# Patient Record
Sex: Male | Born: 1935 | Race: White | Hispanic: No | Marital: Single | Smoking: Former smoker
Health system: Southern US, Community
[De-identification: ages and names within clinical notes are randomized; demographics above are authoritative.]

## PROBLEM LIST (undated history)

## (undated) DIAGNOSIS — J45909 Unspecified asthma, uncomplicated: Secondary | ICD-10-CM

## (undated) DIAGNOSIS — I739 Peripheral vascular disease, unspecified: Secondary | ICD-10-CM

## (undated) DIAGNOSIS — E785 Hyperlipidemia, unspecified: Secondary | ICD-10-CM

## (undated) DIAGNOSIS — I10 Essential (primary) hypertension: Secondary | ICD-10-CM

## (undated) DIAGNOSIS — M25569 Pain in unspecified knee: Secondary | ICD-10-CM

## (undated) DIAGNOSIS — M1990 Unspecified osteoarthritis, unspecified site: Secondary | ICD-10-CM

## (undated) DIAGNOSIS — R0989 Other specified symptoms and signs involving the circulatory and respiratory systems: Secondary | ICD-10-CM

## (undated) DIAGNOSIS — I2510 Atherosclerotic heart disease of native coronary artery without angina pectoris: Secondary | ICD-10-CM

## (undated) DIAGNOSIS — K648 Other hemorrhoids: Secondary | ICD-10-CM

## (undated) DIAGNOSIS — I714 Abdominal aortic aneurysm, without rupture, unspecified: Secondary | ICD-10-CM

## (undated) DIAGNOSIS — C801 Malignant (primary) neoplasm, unspecified: Secondary | ICD-10-CM

## (undated) DIAGNOSIS — K5730 Diverticulosis of large intestine without perforation or abscess without bleeding: Secondary | ICD-10-CM

## (undated) DIAGNOSIS — N529 Male erectile dysfunction, unspecified: Secondary | ICD-10-CM

## (undated) DIAGNOSIS — J449 Chronic obstructive pulmonary disease, unspecified: Secondary | ICD-10-CM

## (undated) DIAGNOSIS — I219 Acute myocardial infarction, unspecified: Secondary | ICD-10-CM

## (undated) DIAGNOSIS — Z8739 Personal history of other diseases of the musculoskeletal system and connective tissue: Secondary | ICD-10-CM

## (undated) DIAGNOSIS — Z8781 Personal history of (healed) traumatic fracture: Secondary | ICD-10-CM

## (undated) DIAGNOSIS — I6529 Occlusion and stenosis of unspecified carotid artery: Secondary | ICD-10-CM

## (undated) HISTORY — DX: Male erectile dysfunction, unspecified: N52.9

## (undated) HISTORY — DX: Occlusion and stenosis of unspecified carotid artery: I65.29

## (undated) HISTORY — DX: Peripheral vascular disease, unspecified: I73.9

## (undated) HISTORY — DX: Diverticulosis of large intestine without perforation or abscess without bleeding: K57.30

## (undated) HISTORY — DX: Abdominal aortic aneurysm, without rupture: I71.4

## (undated) HISTORY — DX: Other specified symptoms and signs involving the circulatory and respiratory systems: R09.89

## (undated) HISTORY — DX: Atherosclerotic heart disease of native coronary artery without angina pectoris: I25.10

## (undated) HISTORY — DX: Essential (primary) hypertension: I10

## (undated) HISTORY — DX: Acute myocardial infarction, unspecified: I21.9

## (undated) HISTORY — DX: Hyperlipidemia, unspecified: E78.5

## (undated) HISTORY — DX: Personal history of (healed) traumatic fracture: Z87.81

## (undated) HISTORY — DX: Chronic obstructive pulmonary disease, unspecified: J44.9

## (undated) HISTORY — DX: Pain in unspecified knee: M25.569

## (undated) HISTORY — DX: Personal history of other diseases of the musculoskeletal system and connective tissue: Z87.39

## (undated) HISTORY — DX: Unspecified osteoarthritis, unspecified site: M19.90

## (undated) HISTORY — DX: Abdominal aortic aneurysm, without rupture, unspecified: I71.40

## (undated) HISTORY — DX: Other hemorrhoids: K64.8

---

## 1999-08-22 ENCOUNTER — Encounter: Payer: Self-pay | Admitting: Physician Assistant

## 2003-01-23 DIAGNOSIS — I219 Acute myocardial infarction, unspecified: Secondary | ICD-10-CM

## 2003-01-23 HISTORY — PX: CORONARY ANGIOPLASTY: SHX604

## 2003-01-23 HISTORY — DX: Acute myocardial infarction, unspecified: I21.9

## 2003-08-31 ENCOUNTER — Encounter: Payer: Self-pay | Admitting: Cardiology

## 2003-08-31 ENCOUNTER — Inpatient Hospital Stay (HOSPITAL_COMMUNITY): Admission: EM | Admit: 2003-08-31 | Discharge: 2003-09-03 | Payer: Self-pay | Admitting: *Deleted

## 2003-09-01 ENCOUNTER — Encounter: Payer: Self-pay | Admitting: Cardiology

## 2003-12-07 ENCOUNTER — Ambulatory Visit (HOSPITAL_COMMUNITY): Admission: RE | Admit: 2003-12-07 | Discharge: 2003-12-07 | Payer: Self-pay | Admitting: Pulmonary Disease

## 2003-12-07 ENCOUNTER — Ambulatory Visit: Payer: Self-pay | Admitting: Pulmonary Disease

## 2004-03-03 ENCOUNTER — Ambulatory Visit: Payer: Self-pay | Admitting: Cardiology

## 2004-05-25 ENCOUNTER — Ambulatory Visit: Payer: Self-pay | Admitting: Cardiology

## 2004-07-24 ENCOUNTER — Ambulatory Visit: Payer: Self-pay | Admitting: Pulmonary Disease

## 2004-08-03 ENCOUNTER — Ambulatory Visit: Payer: Self-pay | Admitting: Pulmonary Disease

## 2004-08-22 ENCOUNTER — Ambulatory Visit: Payer: Self-pay | Admitting: Cardiology

## 2004-10-17 ENCOUNTER — Ambulatory Visit: Payer: Self-pay | Admitting: Pulmonary Disease

## 2004-11-22 ENCOUNTER — Ambulatory Visit: Payer: Self-pay | Admitting: *Deleted

## 2004-11-22 ENCOUNTER — Encounter: Payer: Self-pay | Admitting: Cardiology

## 2005-04-06 ENCOUNTER — Ambulatory Visit: Payer: Self-pay | Admitting: Cardiology

## 2005-11-07 ENCOUNTER — Ambulatory Visit: Payer: Self-pay | Admitting: Pulmonary Disease

## 2005-11-21 ENCOUNTER — Ambulatory Visit: Payer: Self-pay | Admitting: Pulmonary Disease

## 2006-01-23 ENCOUNTER — Ambulatory Visit: Payer: Self-pay | Admitting: Internal Medicine

## 2006-02-21 ENCOUNTER — Ambulatory Visit: Payer: Self-pay | Admitting: Internal Medicine

## 2006-04-25 ENCOUNTER — Ambulatory Visit: Payer: Self-pay | Admitting: Pulmonary Disease

## 2006-09-30 ENCOUNTER — Ambulatory Visit: Payer: Self-pay | Admitting: Surgery

## 2006-10-29 ENCOUNTER — Ambulatory Visit: Payer: Self-pay | Admitting: Pulmonary Disease

## 2006-11-04 ENCOUNTER — Ambulatory Visit: Payer: Self-pay | Admitting: Pulmonary Disease

## 2006-11-04 LAB — CONVERTED CEMR LAB
ALT: 25 units/L (ref 0–53)
AST: 27 units/L (ref 0–37)
Albumin: 3.6 g/dL (ref 3.5–5.2)
Alkaline Phosphatase: 85 units/L (ref 39–117)
BUN: 8 mg/dL (ref 6–23)
Basophils Absolute: 0 10*3/uL (ref 0.0–0.1)
Basophils Relative: 0.7 % (ref 0.0–1.0)
Bilirubin, Direct: 0.3 mg/dL (ref 0.0–0.3)
CO2: 33 meq/L — ABNORMAL HIGH (ref 19–32)
Calcium: 9.3 mg/dL (ref 8.4–10.5)
Chloride: 106 meq/L (ref 96–112)
Cholesterol: 109 mg/dL (ref 0–200)
Creatinine, Ser: 0.9 mg/dL (ref 0.4–1.5)
Eosinophils Absolute: 0.5 10*3/uL (ref 0.0–0.6)
Eosinophils Relative: 7.9 % — ABNORMAL HIGH (ref 0.0–5.0)
GFR calc Af Amer: 108 mL/min
GFR calc non Af Amer: 89 mL/min
Glucose, Bld: 110 mg/dL — ABNORMAL HIGH (ref 70–99)
HCT: 41.1 % (ref 39.0–52.0)
HDL: 36.4 mg/dL — ABNORMAL LOW (ref >39.0)
Hemoglobin: 14.2 g/dL (ref 13.0–17.0)
LDL Cholesterol: 59 mg/dL (ref 0–99)
Lymphocytes Relative: 21.4 % (ref 12.0–46.0)
MCHC: 34.5 g/dL (ref 30.0–36.0)
MCV: 91.8 fL (ref 78.0–100.0)
Monocytes Absolute: 0.6 10*3/uL (ref 0.2–0.7)
Monocytes Relative: 9.7 % (ref 3.0–11.0)
Neutro Abs: 3.7 10*3/uL (ref 1.4–7.7)
Neutrophils Relative %: 60.3 % (ref 43.0–77.0)
PSA: 0.69 ng/mL (ref 0.10–4.00)
Platelets: 165 10*3/uL (ref 150–400)
Potassium: 4.7 meq/L (ref 3.5–5.1)
RBC: 4.47 M/uL (ref 4.22–5.81)
RDW: 12.4 % (ref 11.5–14.6)
Sodium: 145 meq/L (ref 135–145)
TSH: 1.59 microintl units/mL (ref 0.35–5.50)
Total Bilirubin: 1.6 mg/dL — ABNORMAL HIGH (ref 0.3–1.2)
Total CHOL/HDL Ratio: 3
Total Protein: 7 g/dL (ref 6.0–8.3)
Triglycerides: 70 mg/dL (ref 0–149)
VLDL: 14 mg/dL (ref 0–40)
WBC: 6.1 10*3/uL (ref 4.5–10.5)

## 2006-11-05 DIAGNOSIS — E785 Hyperlipidemia, unspecified: Secondary | ICD-10-CM | POA: Insufficient documentation

## 2006-11-05 DIAGNOSIS — K648 Other hemorrhoids: Secondary | ICD-10-CM | POA: Insufficient documentation

## 2006-11-05 DIAGNOSIS — I10 Essential (primary) hypertension: Secondary | ICD-10-CM | POA: Insufficient documentation

## 2006-11-05 DIAGNOSIS — I739 Peripheral vascular disease, unspecified: Secondary | ICD-10-CM | POA: Insufficient documentation

## 2006-11-05 DIAGNOSIS — I2510 Atherosclerotic heart disease of native coronary artery without angina pectoris: Secondary | ICD-10-CM | POA: Insufficient documentation

## 2007-04-29 ENCOUNTER — Telehealth (INDEPENDENT_AMBULATORY_CARE_PROVIDER_SITE_OTHER): Payer: Self-pay | Admitting: *Deleted

## 2007-04-29 ENCOUNTER — Ambulatory Visit: Payer: Self-pay | Admitting: Pulmonary Disease

## 2007-04-29 DIAGNOSIS — M109 Gout, unspecified: Secondary | ICD-10-CM

## 2007-04-29 DIAGNOSIS — K5730 Diverticulosis of large intestine without perforation or abscess without bleeding: Secondary | ICD-10-CM | POA: Insufficient documentation

## 2007-04-29 DIAGNOSIS — T148XXA Other injury of unspecified body region, initial encounter: Secondary | ICD-10-CM | POA: Insufficient documentation

## 2007-05-02 DIAGNOSIS — J4489 Other specified chronic obstructive pulmonary disease: Secondary | ICD-10-CM | POA: Insufficient documentation

## 2007-05-02 DIAGNOSIS — F528 Other sexual dysfunction not due to a substance or known physiological condition: Secondary | ICD-10-CM

## 2007-05-02 DIAGNOSIS — J449 Chronic obstructive pulmonary disease, unspecified: Secondary | ICD-10-CM

## 2007-05-02 LAB — CONVERTED CEMR LAB
ALT: 24 units/L (ref 0–53)
AST: 30 units/L (ref 0–37)
Albumin: 4.1 g/dL (ref 3.5–5.2)
Alkaline Phosphatase: 81 units/L (ref 39–117)
BUN: 13 mg/dL (ref 6–23)
Bilirubin, Direct: 0.3 mg/dL (ref 0.0–0.3)
CO2: 32 meq/L (ref 19–32)
Calcium: 9.3 mg/dL (ref 8.4–10.5)
Chloride: 104 meq/L (ref 96–112)
Cholesterol: 141 mg/dL (ref 0–200)
Creatinine, Ser: 0.9 mg/dL (ref 0.4–1.5)
GFR calc Af Amer: 107 mL/min
GFR calc non Af Amer: 89 mL/min
Glucose, Bld: 91 mg/dL (ref 70–99)
HDL: 38 mg/dL — ABNORMAL LOW (ref >39.0)
LDL Cholesterol: 87 mg/dL (ref 0–99)
Potassium: 4.7 meq/L (ref 3.5–5.1)
Sodium: 140 meq/L (ref 135–145)
Total Bilirubin: 1.6 mg/dL — ABNORMAL HIGH (ref 0.3–1.2)
Total CHOL/HDL Ratio: 3.7
Total Protein: 7.3 g/dL (ref 6.0–8.3)
Triglycerides: 81 mg/dL (ref 0–149)
VLDL: 16 mg/dL (ref 0–40)

## 2007-10-06 ENCOUNTER — Ambulatory Visit: Payer: Self-pay | Admitting: Surgery

## 2007-10-28 ENCOUNTER — Ambulatory Visit: Payer: Self-pay | Admitting: Pulmonary Disease

## 2007-11-02 DIAGNOSIS — R0989 Other specified symptoms and signs involving the circulatory and respiratory systems: Secondary | ICD-10-CM | POA: Insufficient documentation

## 2007-11-02 LAB — CONVERTED CEMR LAB
ALT: 29 units/L (ref 0–53)
AST: 32 units/L (ref 0–37)
Albumin: 4.1 g/dL (ref 3.5–5.2)
Alkaline Phosphatase: 74 units/L (ref 39–117)
BUN: 13 mg/dL (ref 6–23)
Basophils Absolute: 0 10*3/uL (ref 0.0–0.1)
Basophils Relative: 0 % (ref 0.0–3.0)
Bilirubin, Direct: 0.2 mg/dL (ref 0.0–0.3)
CO2: 30 meq/L (ref 19–32)
Calcium: 9.4 mg/dL (ref 8.4–10.5)
Chloride: 104 meq/L (ref 96–112)
Cholesterol: 150 mg/dL (ref 0–200)
Creatinine, Ser: 1 mg/dL (ref 0.4–1.5)
Eosinophils Absolute: 0.5 10*3/uL (ref 0.0–0.7)
Eosinophils Relative: 4.9 % (ref 0.0–5.0)
GFR calc Af Amer: 95 mL/min
GFR calc non Af Amer: 79 mL/min
Glucose, Bld: 112 mg/dL — ABNORMAL HIGH (ref 70–99)
HCT: 44.7 % (ref 39.0–52.0)
HDL: 45.2 mg/dL (ref >39.0)
Hemoglobin: 15.6 g/dL (ref 13.0–17.0)
LDL Cholesterol: 81 mg/dL (ref 0–99)
Lymphocytes Relative: 15.6 % (ref 12.0–46.0)
MCHC: 34.9 g/dL (ref 30.0–36.0)
MCV: 91.4 fL (ref 78.0–100.0)
Monocytes Absolute: 0.9 10*3/uL (ref 0.1–1.0)
Monocytes Relative: 8.3 % (ref 3.0–12.0)
Neutro Abs: 7.4 10*3/uL (ref 1.4–7.7)
Neutrophils Relative %: 71.2 % (ref 43.0–77.0)
PSA: 0.67 ng/mL (ref 0.10–4.00)
Platelets: 192 10*3/uL (ref 150–400)
Potassium: 4.5 meq/L (ref 3.5–5.1)
RBC: 4.89 M/uL (ref 4.22–5.81)
RDW: 12.1 % (ref 11.5–14.6)
Sodium: 141 meq/L (ref 135–145)
TSH: 1.16 microintl units/mL (ref 0.35–5.50)
Total Bilirubin: 1.2 mg/dL (ref 0.3–1.2)
Total CHOL/HDL Ratio: 3.3
Total Protein: 7.6 g/dL (ref 6.0–8.3)
Triglycerides: 120 mg/dL (ref 0–149)
VLDL: 24 mg/dL (ref 0–40)
WBC: 10.4 10*3/uL (ref 4.5–10.5)

## 2008-02-03 ENCOUNTER — Ambulatory Visit: Payer: Self-pay

## 2008-04-01 ENCOUNTER — Ambulatory Visit: Payer: Self-pay | Admitting: *Deleted

## 2008-04-08 ENCOUNTER — Telehealth (INDEPENDENT_AMBULATORY_CARE_PROVIDER_SITE_OTHER): Payer: Self-pay | Admitting: *Deleted

## 2008-04-26 ENCOUNTER — Ambulatory Visit: Payer: Self-pay | Admitting: Pulmonary Disease

## 2008-04-26 ENCOUNTER — Telehealth (INDEPENDENT_AMBULATORY_CARE_PROVIDER_SITE_OTHER): Payer: Self-pay | Admitting: *Deleted

## 2008-05-02 LAB — CONVERTED CEMR LAB
ALT: 28 units/L (ref 0–53)
AST: 28 units/L (ref 0–37)
Albumin: 3.9 g/dL (ref 3.5–5.2)
Alkaline Phosphatase: 73 units/L (ref 39–117)
BUN: 12 mg/dL (ref 6–23)
Basophils Absolute: 0 10*3/uL (ref 0.0–0.1)
Basophils Relative: 0.5 % (ref 0.0–3.0)
Bilirubin, Direct: 0.2 mg/dL (ref 0.0–0.3)
CO2: 31 meq/L (ref 19–32)
Calcium: 9.2 mg/dL (ref 8.4–10.5)
Chloride: 107 meq/L (ref 96–112)
Cholesterol: 145 mg/dL (ref 0–200)
Creatinine, Ser: 0.9 mg/dL (ref 0.4–1.5)
Eosinophils Absolute: 0.5 10*3/uL (ref 0.0–0.7)
Eosinophils Relative: 5.4 % — ABNORMAL HIGH (ref 0.0–5.0)
GFR calc non Af Amer: 88.39 mL/min (ref >60)
Glucose, Bld: 96 mg/dL (ref 70–99)
HCT: 44.9 % (ref 39.0–52.0)
HDL: 41.3 mg/dL (ref >39.00)
Hemoglobin: 15.3 g/dL (ref 13.0–17.0)
LDL Cholesterol: 86 mg/dL (ref 0–99)
Lymphocytes Relative: 18.9 % (ref 12.0–46.0)
Lymphs Abs: 1.6 10*3/uL (ref 0.7–4.0)
MCHC: 34.1 g/dL (ref 30.0–36.0)
MCV: 93 fL (ref 78.0–100.0)
Monocytes Absolute: 0.6 10*3/uL (ref 0.1–1.0)
Monocytes Relative: 7 % (ref 3.0–12.0)
Neutro Abs: 5.7 10*3/uL (ref 1.4–7.7)
Neutrophils Relative %: 68.2 % (ref 43.0–77.0)
PSA: 0.76 ng/mL (ref 0.10–4.00)
Platelets: 153 10*3/uL (ref 150.0–400.0)
Potassium: 4.5 meq/L (ref 3.5–5.1)
RBC: 4.84 M/uL (ref 4.22–5.81)
RDW: 12.1 % (ref 11.5–14.6)
Sodium: 143 meq/L (ref 135–145)
TSH: 1.3 microintl units/mL (ref 0.35–5.50)
Total Bilirubin: 1 mg/dL (ref 0.3–1.2)
Total CHOL/HDL Ratio: 4
Total Protein: 7.5 g/dL (ref 6.0–8.3)
Triglycerides: 90 mg/dL (ref 0.0–149.0)
Uric Acid, Serum: 8.4 mg/dL — ABNORMAL HIGH (ref 4.0–7.8)
VLDL: 18 mg/dL (ref 0.0–40.0)
WBC: 8.4 10*3/uL (ref 4.5–10.5)

## 2008-05-31 LAB — CONVERTED CEMR LAB: Vit D, 25-Hydroxy: 26 ng/mL — ABNORMAL LOW (ref 30–89)

## 2008-11-18 ENCOUNTER — Ambulatory Visit: Payer: Self-pay | Admitting: Vascular Surgery

## 2009-01-27 ENCOUNTER — Ambulatory Visit: Payer: Self-pay | Admitting: Pulmonary Disease

## 2009-01-30 LAB — CONVERTED CEMR LAB
ALT: 32 units/L (ref 0–53)
AST: 29 units/L (ref 0–37)
Albumin: 3.9 g/dL (ref 3.5–5.2)
Alkaline Phosphatase: 78 units/L (ref 39–117)
BUN: 16 mg/dL (ref 6–23)
Basophils Absolute: 0.2 10*3/uL — ABNORMAL HIGH (ref 0.0–0.1)
Basophils Relative: 1.9 % (ref 0.0–3.0)
Bilirubin, Direct: 0.2 mg/dL (ref 0.0–0.3)
CO2: 30 meq/L (ref 19–32)
Calcium: 9.3 mg/dL (ref 8.4–10.5)
Chloride: 105 meq/L (ref 96–112)
Cholesterol: 137 mg/dL (ref 0–200)
Creatinine, Ser: 0.9 mg/dL (ref 0.4–1.5)
Eosinophils Absolute: 0.4 10*3/uL (ref 0.0–0.7)
Eosinophils Relative: 4.1 % (ref 0.0–5.0)
GFR calc non Af Amer: 88.2 mL/min (ref >60)
Glucose, Bld: 83 mg/dL (ref 70–99)
HCT: 45.3 % (ref 39.0–52.0)
HDL: 33.3 mg/dL — ABNORMAL LOW (ref >39.00)
Hemoglobin: 14.8 g/dL (ref 13.0–17.0)
LDL Cholesterol: 86 mg/dL (ref 0–99)
Lymphocytes Relative: 15 % (ref 12.0–46.0)
Lymphs Abs: 1.3 10*3/uL (ref 0.7–4.0)
MCHC: 32.7 g/dL (ref 30.0–36.0)
MCV: 98.1 fL (ref 78.0–100.0)
Monocytes Absolute: 0.5 10*3/uL (ref 0.1–1.0)
Monocytes Relative: 6.2 % (ref 3.0–12.0)
Neutro Abs: 6.3 10*3/uL (ref 1.4–7.7)
Neutrophils Relative %: 72.8 % (ref 43.0–77.0)
Platelets: 190 10*3/uL (ref 150.0–400.0)
Potassium: 4.3 meq/L (ref 3.5–5.1)
RBC: 4.62 M/uL (ref 4.22–5.81)
RDW: 12.8 % (ref 11.5–14.6)
Sodium: 143 meq/L (ref 135–145)
Total Bilirubin: 1.2 mg/dL (ref 0.3–1.2)
Total CHOL/HDL Ratio: 4
Total Protein: 7.6 g/dL (ref 6.0–8.3)
Triglycerides: 87 mg/dL (ref 0.0–149.0)
Uric Acid, Serum: 5.3 mg/dL (ref 4.0–7.8)
VLDL: 17.4 mg/dL (ref 0.0–40.0)
WBC: 8.7 10*3/uL (ref 4.5–10.5)

## 2009-05-25 ENCOUNTER — Encounter: Payer: Self-pay | Admitting: Pulmonary Disease

## 2009-05-25 ENCOUNTER — Ambulatory Visit: Payer: Self-pay | Admitting: Vascular Surgery

## 2009-05-30 ENCOUNTER — Telehealth: Payer: Self-pay | Admitting: Adult Health

## 2009-05-31 ENCOUNTER — Ambulatory Visit: Payer: Self-pay | Admitting: Adult Health

## 2009-05-31 DIAGNOSIS — M25569 Pain in unspecified knee: Secondary | ICD-10-CM

## 2009-06-08 ENCOUNTER — Encounter: Payer: Self-pay | Admitting: Pulmonary Disease

## 2009-07-11 ENCOUNTER — Telehealth (INDEPENDENT_AMBULATORY_CARE_PROVIDER_SITE_OTHER): Payer: Self-pay | Admitting: *Deleted

## 2009-08-31 ENCOUNTER — Ambulatory Visit: Payer: Self-pay | Admitting: Cardiology

## 2009-09-01 ENCOUNTER — Encounter: Payer: Self-pay | Admitting: Cardiology

## 2009-09-07 ENCOUNTER — Encounter: Payer: Self-pay | Admitting: Physician Assistant

## 2009-09-07 ENCOUNTER — Ambulatory Visit: Payer: Self-pay | Admitting: Vascular Surgery

## 2009-11-30 ENCOUNTER — Ambulatory Visit: Payer: Self-pay | Admitting: Vascular Surgery

## 2009-12-28 ENCOUNTER — Ambulatory Visit: Payer: Self-pay | Admitting: Pulmonary Disease

## 2010-01-27 ENCOUNTER — Telehealth: Payer: Self-pay | Admitting: Pulmonary Disease

## 2010-02-22 NOTE — Progress Notes (Signed)
Summary: Office Visit/ [employer]  Office Visit/ [facility]   Imported By: Dorise Hiss 08/30/2009 16:54:21  _____________________________________________________________________  External Attachment:    Type:   Image     Comment:   External Document

## 2010-02-22 NOTE — Assessment & Plan Note (Signed)
Summary: Acute NP office visit - knee pain   CC:  bilat knee pain 1.5weeks; states it feels like it's in the ligaments or tendons.  swelling and occ burning sensation in left knee which patient states is worse than the right.  states took indomethacine and colchicine but didn't help pain.[name]  History of Present Illness: 75  y/o WM here for a follow up visit... he has mult med issues as noted below...    ~  Apr10:  he is followed for cardiology by [facility] in their [facility]... today he notes a cough w/ brownish sputum x 3-4 weeks... no f/c/s, no incr SOB, no CP, etc... last CXR 10/07 showed underlying COPD, NAD;  last CBC 10/09 was OK... we discussed Rx w/ Avelox, Mucinex, Fluids... NOTE:  CDoppler's from 1/10 showed monophasic left brachial waveform and retrograde vertebral flow c/w steal...   ~  January 27, 2009:  presents w/ 2wk hx "gout" in left knee... painful, tender, not really swollen or red etc... went to an [facility] w/ XRay showing "mild arthritis" & given Indocin & Colchicine- but only min better... he's been on Allopurinol 300mg /d since last OV w/ uric= 8.4 then & no bouts til now... we discussed Rx w/ Deop/ Pred now & refer to Ortho for tap etc if this occurs in the future, check labs.  May 31, 2009--Presents for a work in visit. Complains of bilat knee pain 1.5weeks; states it feels like it's in the ligaments or tendons.  swelling, occ burning sensation in left knee which patient states is worse than the right.  states took indomethacine and colchicine but didn't help pain. Had similar episode earlier this year in January, Uric acid level was normal. He was tx w/ steroid burst which helped. Has had minimal swelling. Knees ache at times and are painful to walk on for long distance and w/ bending. Sore to touch below knee caps esp on left. no rash, calf pain, swelling or chest pain.          Medications Prior to Update: 1)  Bufferin 325 Mg Tabs (Aspirin Buf(Cacarb-Mgcarb-Mgo)) .... Take 1  Tab By Mouth Once Daily.Marland KitchenMarland Kitchen 2)  Plavix 75 Mg  Tabs (Clopidogrel Bisulfate) .... Take 1 Tab By Mouth Once Daily 3)  Toprol Xl 50 Mg  Tb24 (Metoprolol Succinate) .... Take 1 Tab By Mouth Once Daily 4)  Nitrostat 0.4 Mg  Subl (Nitroglycerin) .... Use As Directed For Chest Pain... 5)  Simvastatin 80 Mg  Tabs (Simvastatin) .... Take 1 Tab By Mouth At Bedtime 6)  Niacin Cr 500 Mg  Cpcr (Niacin) .... Take 1 Tab By Mouth At Bedtime.Marland KitchenMarland Kitchen 7)  Viagra 100 Mg  Tabs (Sildenafil Citrate) .... Use As Directed... 8)  Indomethacin 25 Mg Caps (Indomethacin) .... Take 1 Cap By Mouth Three Times A Day As Needed For Gout Pain... 9)  Colchicine 0.6 Mg  Tabs (Colchicine) .... Take 1 Tab By Mouth Two Times A Day As Needed For Gout... 10)  Allopurinol 300 Mg Tabs (Allopurinol) .... Take 1 Tablet By Mouth Once A Day For Gout Prevention 11)  Vitamin D3 1000 Unit Caps (Cholecalciferol) .... Take 1 Tablet By Mouth Once A Day 12)  Prednisone 20 Mg Tabs (Prednisone) .... Take 1 Tab By Mouth Two Times A Day X3d, Then 1 Tab Daily X3d, Then 1/2 Tab Daily Til Gone...  Current Medications (verified): 1)  Bufferin 325 Mg Tabs (Aspirin Buf(Cacarb-Mgcarb-Mgo)) .... Take 1 Tab By Mouth Once Daily.Marland KitchenMarland Kitchen 2)  Plavix 75 Mg  Tabs (Clopidogrel Bisulfate) .... Take 1 Tab By Mouth Once Daily 3)  Toprol Xl 50 Mg  Tb24 (Metoprolol Succinate) .... Take 1/2 Tab By Mouth Once Daily 4)  Nitrostat 0.4 Mg  Subl (Nitroglycerin) .... Use As Directed For Chest Pain... 5)  Simvastatin 80 Mg  Tabs (Simvastatin) .... Take 1 Tab By Mouth At Bedtime 6)  Niacin Cr 500 Mg  Cpcr (Niacin) .... Take 1 Tab By Mouth At Bedtime.Marland KitchenMarland Kitchen 7)  Viagra 100 Mg  Tabs (Sildenafil Citrate) .... Use As Directed... 8)  Indomethacin 25 Mg Caps (Indomethacin) .... Take 1 Cap By Mouth Three Times A Day As Needed For Gout Pain... 9)  Colchicine 0.6 Mg  Tabs (Colchicine) .... Take 1 Tab By Mouth Two Times A Day As Needed For Gout... 10)  Allopurinol 300 Mg Tabs (Allopurinol) .... Take 1  Tablet By Mouth Once A Day For Gout Prevention 11)  Vitamin D3 1000 Unit Caps (Cholecalciferol) .... Take 1 Tablet By Mouth Once A Day  Allergies (verified): 1)  ! Pcn 2)  ! Sulfa 3)  ! Doxycycline  Past History:  Family History: Last updated: February 18, 2009 Father died age 73 from an MI Mother alive age 12 w/ hx heart dis, DM, colon cancer... 5Sibs:  4Bro- 1 died from lung cancer, 1 has DM, 1has heart dis & CABG...  & 1Sis- in good health.  Social History: Last updated: 02/18/2009 Married  smoked 1 1/2 PPD for 20+ years--quit smoking in about 1984 social alcohol works part time at pharmacy  Risk Factors: Smoking Status: quit (10/28/2007)  Past Medical History: COPD(ICD-496)  -  ex-smoker, quit 20+yrs ago... he uses Mucinex as needed...    ~  CTChest 11/05 w/ emphysema changes...  ~  CXR 10/07 showed mod emphysematous changes, clear lungs, NAD.[name].  ~  Apr10:  acute bronchitic symptoms- CXR= COPD, scarring right base, NAD; Rx w/ Avelox/ Mucinex.  HYPERTENSION (ICD-401.9) - on TOPROL XL 50mg - 1/2 tab daily (he decr on his own)...   CORONARY ARTERY DISEASE (ICD-414.00) - on ASA 325mg /d & PLAVIX 75mg /d...     Hx of CAROTID BRUIT (ICD-785.9) - known left CBruit w/ CDoppler in 2000 showing min plaque, and no signif flow reduction...  ~  CDoppler 1/10 showed 0-39% bilat ICA stenoses + monophasic left brachial waveform and retrograde left vertebral flow c/w steal...  PERIPHERAL VASCULAR DISEASE (ICD-443.9) - on above meds... 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 followed by [facility] yearly: no pain, etc...  ~  AbdAo Doppler 8/08 showed 3.6-3.7cm size... no change.  ~  AbdAo Doppler 9/09 showed 3.9cm AAA and they plan f/u in 55mo...  ~  AbdAo Doppler 3/10 showed 4.0 x 4.2 cm AA measurement...  ~  AbdAo Doppler 10/10 showed 4.4 x 3.8 cm AA measurement...  HYPERLIPIDEMIA (ICD-272.4) - on SIMVASTATIN 80mg /d & OTC NIACIN Bid...   ~  FLP 10/08  showed TChol 109, TG 70, HDL 36, LDL 59  ~  FLP 4/09 showed TChol 141, TG 81, HDL 38, LDL 87... continue same.  ~  FLP 10/09 showed TChol 150, TG 120, HDL 45, LDL 81  ~  FLP 4/10 showed TChol 145, TG 90, HDL 41, LDL 86  ~  FLP 1/11 showed TChol 137, TG 87, HDL 33, LDL 86  DIVERTICULOSIS OF COLON (ICD-562.10) - last colonoscopy 1/08 by DrBrodie showed divertics, hems... f/u planned 12yrs... HEMORRHOIDS, INTERNAL (ICD-455.0)  ERECTILE DYSFUNCTION (ICD-302.72) - uses Viagra as needed.  Hx of GOUT (ICD-274.9) -  on ALLOPURINOL 300mg /d since 4/10... he uses Indocin and Colchicine as needed...  ~  1/11:  bout of left knee pain/ tender, min swelling, not red- min better on Indocin/ Colchicine- therefore Rx w/ Depo/ Pred... Uric=5.3     Review of Systems      See HPI  Vital Signs:  Patient profile:   75 year old male Height:      70 inches Weight:      168 pounds BMI:     24.19 O2 Sat:      98 % on Room air Temp:     98.1 degrees F oral Pulse rate:   71 / minute BP sitting:   114 / 84  (left arm) Cuff size:   regular  Vitals Entered By: Boone Master CNA (May 31, 2009 10:12 AM)  O2 Flow:  Room air CC: bilat knee pain 1.5weeks; states it feels like it's in the ligaments or tendons.  swelling, occ burning sensation in left knee which patient states is worse than the right.  states took indomethacine and colchicine but didn't help pain. Is Patient Diabetic? No Comments Medications reviewed with patient Daytime contact number verified with patient. Boone Master CNA  May 31, 2009 10:10 AM    Physical Exam  Additional Exam:  WD, WN, 75 y/o WM in NAD... GENERAL:  Alert & oriented; pleasant & cooperative... HEENT:  [place]/AT, EOM-wnl, PERRLA, EACs-clear, TMs-wnl, NOSE-clear, THROAT- clear... NECK:  Supple w/ fairROM; no JVD; normal carotid impulses w/o bruits; no thyromegaly or nodules palpated; no lymphadenopathy. CHEST:  Clear to P & A; decr BS bilat, without wheezes/ rales/ or  rhonchi. HEART:  Regular Rhythm; gr 1/6 SEM without rubs or gallops... ABDOMEN:  Soft & nontender; normal bowel sounds; no organomegaly or masses detected. EXT: without deformities, mild arthritic changes & tender left knee; no varicose veins/ venous insuffic/ or edema. NEURO:  CN's intact; motor testing normal; sensory testing normal; gait normal nml but states knee hurts esp on left.  Muscul: tender along bilateral knees under patella. no rash, redness, neg homans sign. no obvious swelling in knee, ROM decreased and painful esp on left.  DERM:  No lesions noted; no rash etc...     Impression & Recommendations:  Problem # 1:  KNEE PAIN (ICD-719.46)  Bilateral knee pian suspect DJD  will refer to ortho to evaluate, unresponsive to NSAIDS  uric acid level nml in past w/ knee flare. doubt gout REC:  Prednisone taper over next week.  Warm heat to knees.  Tramadol for severe pain, 1 by mouth every 6 hr as needed We are referring you to ortho to evaluate your knee pain. Please contact office for sooner follow up if symptoms do not improve or worsen   Orders: Est. Patient Level IV (16109)  Medications Added to Medication List This Visit: 1)  Toprol Xl 50 Mg Tb24 (Metoprolol succinate) .... Take 1/2 tab by mouth once daily 2)  Prednisone 10 Mg Tabs (Prednisone) .... 4 tabs for 2 days, then 3 tabs for 2 days, 2 tabs for 2 days, then 1 tab for 2 days, then stop 3)  Tramadol Hcl 50 Mg Tabs (Tramadol hcl) .[name].. 1 by mouth every 6 hr as needed pain  Complete Medication List: 1)  Bufferin 325 Mg Tabs (Aspirin buf(cacarb-mgcarb-mgo)) .... Take 1 tab by mouth once daily.Marland KitchenMarland Kitchen 2)  Plavix 75 Mg Tabs (Clopidogrel bisulfate) .... Take 1 tab by mouth once daily 3)  Toprol Xl 50 Mg Tb24 (Metoprolol succinate) .Marland KitchenMarland KitchenMarland Kitchen  Take 1/2 tab by mouth once daily 4)  Nitrostat 0.4 Mg Subl (Nitroglycerin) .... Use as directed for chest pain.Marland KitchenMarland Kitchen 5)  Simvastatin 80 Mg Tabs (Simvastatin) .... Take 1 tab by mouth at  bedtime 6)  Niacin Cr 500 Mg Cpcr (Niacin) .... Take 1 tab by mouth at bedtime.Marland KitchenMarland Kitchen 7)  Viagra 100 Mg Tabs (Sildenafil citrate) .... Use as directed... 8)  Indomethacin 25 Mg Caps (Indomethacin) .... Take 1 cap by mouth three times a day as needed for gout pain.Marland KitchenMarland Kitchen 9)  Colchicine 0.6 Mg Tabs (Colchicine) .... Take 1 tab by mouth two times a day as needed for gout... 10)  Allopurinol 300 Mg Tabs (Allopurinol) .... Take 1 tablet by mouth once a day for gout prevention 11)  Vitamin D3 1000 Unit Caps (Cholecalciferol) .... Take 1 tablet by mouth once a day 12)  Prednisone 10 Mg Tabs (Prednisone) .... 4 tabs for 2 days, then 3 tabs for 2 days, 2 tabs for 2 days, then 1 tab for 2 days, then stop 13)  Tramadol Hcl 50 Mg Tabs (Tramadol hcl) .[name].. 1 by mouth every 6 hr as needed pain  Other Orders: Orthopedic Referral (Ortho)  Patient Instructions: 1)  Prednisone taper over next week.  2)  Warm heat to knees.  3)  Tramadol for severe pain, 1 by mouth every 6 hr as needed 4)  We are referring you to ortho to evaluate your knee pain. 5)  Please contact office for sooner follow up if symptoms do not improve or worsen  Prescriptions: TRAMADOL HCL 50 MG TABS (TRAMADOL HCL) 1 by mouth every 6 hr as needed pain  #20 x 0   Entered and Authorized by:   Rubye Oaks NP   Signed by:   Undrea Archbold NP on 05/31/2009   Method used:   Electronically to        [facility] [address] (retail)       [address]       [place], [place]  [address]       Ph: [phone] or [phone]       Fax: [phone]   RxID:   [id number] PREDNISONE 10 MG TABS (PREDNISONE) 4 tabs for 2 days, then 3 tabs for 2 days, 2 tabs for 2 days, then 1 tab for 2 days, then stop  #20 x 0   Entered and Authorized by:   Rubye Oaks NP   Signed by:   Lula Michaux NP on 05/31/2009   Method used:   Electronically to        [facility] [address] (retail)       [address]        [place], [place]  [address]       Ph: [phone] or [phone]       Fax: [phone]   RxID:   [id number]
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
## 2010-02-22 NOTE — Letter (Signed)
Summary: External Correspondence/ FAXED [facility]  External Correspondence/ FAXED [facility]   Imported By: Dorise Hiss 09/01/2009 11:28:47  _____________________________________________________________________  External Attachment:    Type:   Image     Comment:   External Document

## 2010-02-22 NOTE — Cardiovascular Report (Signed)
Summary: Cardiac Catheterization  Cardiac Catheterization   Imported By: Dorise Hiss 08/30/2009 17:00:07  _____________________________________________________________________  External Attachment:    Type:   Image     Comment:   External Document

## 2010-02-22 NOTE — Letter (Signed)
Summary: [facility]  [facility]   Imported By: Sherian Rein 06/06/2009 10:51:21  _____________________________________________________________________  External Attachment:    Type:   Image     Comment:   External Document

## 2010-02-22 NOTE — Progress Notes (Signed)
Summary: nos appt  Phone Note Call from Patient   Caller: [email or web address]  Call For: [name] Summary of Call: Pt rsc nos from 5/6 to 5/10 @ 10:15a. Initial call taken by: Darletta Moll,  May 30, 2009 10:56 AM

## 2010-02-22 NOTE — Cardiovascular Report (Signed)
Summary: Cardiac Catheterization  Cardiac Catheterization   Imported By: Dorise Hiss 08/30/2009 17:02:03  _____________________________________________________________________  External Attachment:    Type:   Image     Comment:   External Document

## 2010-02-22 NOTE — Progress Notes (Signed)
Summary: rx request Prednisone  Phone Note Call from Patient   Caller: Daughter Call For: tammy parrett Reason for Call: Talk to Doctor Summary of Call: daughter states that she was told to call tp anytime that pt needs a refill of prednisone 10mg . [facility] [phone]. daughter Clinton Gallant # [phone] Initial call taken by: Tivis Ringer, CNA,  July 11, 2009 9:56 AM  Follow-up for Phone Call        Per pt's daugther-Pt c/o bilateral knee pain when walking worse when bending, warm to touch, edema x 5 days. Pt states took Prednisone 10mg  that was left over from before and states same is helping. Pt has been cleared by orth. Please advise. Zackery Barefoot CMA  July 11, 2009 11:22 AM   Additional Follow-up for Phone Call Additional follow up Details #1::        recent eval w/ ortho w/ cont of gout meds. not working w/ flare.  May have prednisone 10mg  4 tabs for 2 days, then 3 tabs for 2 days, 2 tabs for 2 days, then 1 tab for 2 days, then stop --#20, no refills.  Warm heat to knees.  Please contact office for sooner follow up if symptoms do not improve or worsen  needs follow up w/ Dr. Kriste Basque to discuss gout. if not already on books, can set up for first available   Additional Follow-up by: Rubye Oaks NP,  July 11, 2009 11:39 AM    Additional Follow-up for Phone Call Additional follow up Details #2::    Rx for prednisone sent.  ATC pt's daughter and NA, unable to leave msg.   LMOMTCB at pt's home number Vernie Murders  July 11, 2009 2:08 PM  Spoke with pt's daughter Misty Stanley and advised the above recs per TP.  Daughter verbalized understanding.  Pt has rov with SN for 08/01/09. Pt will keep appt.  Prescriptions: PREDNISONE 10 MG TABS (PREDNISONE) 4 tabs for 2 days, then 3 tabs for 2 days, 2 tabs for 2 days, then 1 tab for 2 days, then stop  #20 x 0   Entered by:   Vernie Murders   Authorized by:   Rubye Oaks NP   Signed by:   Vernie Murders on 07/11/2009   Method used:    Electronically to        [facility] [address] (retail)       [address]       [place], [place]  [address]       Ph: [phone] or [phone]       Fax: [phone]   RxID:   [id number]

## 2010-02-22 NOTE — Assessment & Plan Note (Signed)
Summary: blood pres diffin each arm/rm      Allergies Added:   Visit Type:  abnormal BP Referring Provider:  Ricky Stabs Primary Provider:  Lalla Brothers  CC:  Abnormal blood pressure.  History of Present Illness: The patient is known to me from the past.  At time of heart catheterization in 1988 he had atheroemboli.  This improved.  Cardiac catheterization was done more recently in 2005.  He had no problems at that time.  I saw him last March 2007.  He had no chest pain then and he is not having any at this time.  He does have hypercholesterolemia.  He follows carefully with Dr.Nadel.  The patient has a normal aortic aneurysm has been followed very carefully.  He also has carotid artery disease.  Doppler in January, 2010, showed mild bilateral internal carotid artery disease.  However there was left brachial monophasic waveform and retrograde left vertebral flow compatible with subclavian steal.  The patient has no symptoms on this.  The patient noted recently that his blood pressure in his left arm was lower than the right arm.  He is here for further evaluation.  Preventive Screening-Counseling & Management  Alcohol-Tobacco     Smoking Status: quit     Year Quit: 1980's  Current Medications (verified): 1)  Bufferin 325 Mg Tabs (Aspirin Buf(Cacarb-Mgcarb-Mgo)) .... Take 1 Tab By Mouth Once Daily.Marland KitchenMarland Kitchen 2)  Plavix 75 Mg  Tabs (Clopidogrel Bisulfate) .... Take 1 Tab By Mouth Once Daily 3)  Toprol Xl 50 Mg  Tb24 (Metoprolol Succinate) .... Take 1/2 Tab By Mouth Once Daily 4)  Nitrostat 0.4 Mg  Subl (Nitroglycerin) .... Use As Directed For Chest Pain... 5)  Simvastatin 80 Mg  Tabs (Simvastatin) .... Take 1 Tab By Mouth At Bedtime 6)  Niacin Cr 500 Mg  Cpcr (Niacin) .... Take 1 Tab By Mouth At Bedtime.Marland KitchenMarland Kitchen 7)  Viagra 100 Mg  Tabs (Sildenafil Citrate) .... Use As Directed... 8)  Indomethacin 25 Mg Caps (Indomethacin) .... Take 1 Cap By Mouth Three Times A Day As Needed For Gout Pain... 9)   Colchicine 0.6 Mg  Tabs (Colchicine) .... Take 1 Tab By Mouth Two Times A Day As Needed For Gout... 10)  Allopurinol 300 Mg Tabs (Allopurinol) .... Take 1 Tablet By Mouth Once A Day For Gout Prevention 11)  Vitamin D3 1000 Unit Caps (Cholecalciferol) .... Take 1 Tablet By Mouth Once A Day 12)  Prednisone 10 Mg Tabs (Prednisone) .... 4 Tabs For 2 Days, Then 3 Tabs For 2 Days, 2 Tabs For 2 Days, Then 1 Tab For 2 Days, Then Stop 13)  Tramadol Hcl 50 Mg Tabs (Tramadol Hcl) .[name].. 1 By Mouth Every 6 Hr As Needed Pain  Allergies (verified): 1)  ! Pcn 2)  ! Sulfa 3)  ! Doxycycline  Comments:  Nurse/Medical Assistant: The patient's medications and allergies were verbally reviewed with the patient and were updated in the Medication and Allergy Lists.  Past History:  Past Medical History: COPD(ICD-496)  -  ex-smoker, quit 20+yrs ago... he uses Mucinex as needed...    ~  CTChest 11/05 w/ emphysema changes...  ~  CXR 10/07 showed mod emphysematous changes, clear lungs, NAD.[name].  ~  Apr10:  acute bronchitic symptoms- CXR= COPD, scarring right base, NAD; Rx w/ Avelox/ Mucinex.  HYPERTENSION  CAD  atheroemboli at time of catheterization  1988   /  ACS 2005... urgent DES to the circumflex.  Plan Plavix indefinitely    Hx of CAROTID  BRUIT (ICD-785.9) - known left CBruit w/ CDoppler in 2000 showing min plaqu  ~  CDoppler 1/10 showed 0-39% bilat ICA stenoses + monophasic left brachial waveform and retrograde left vertebral flow c/w steal...  PERIPHERAL VASCULAR DISEASE (ICD-443.9) - on above meds... 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 followed [facility]: no pain, etc...  ~  AbdAo Doppler 8/08 showed 3.6-3.7cm size... no change.  ~  AbdAo Doppler 9/09 showed 3.9cm AAA and they plan f/u in 20mo...  ~  AbdAo Doppler 3/10 showed 4.0 x 4.2 cm AA measurement...  ~  AbdAo Doppler 10/10 showed 4.4 x 3.8 cm AA measurement...  HYPERLIPIDEMIA (ICD-272.4) - on  SIMVASTATIN 80mg /d & OTC NIACIN Bid...   ~  FLP 10/08 showed TChol 109, TG 70, HDL 36, LDL 59  ~  FLP 4/09 showed TChol 141, TG 81, HDL 38, LDL 87... continue same.  ~  FLP 10/09 showed TChol 150, TG 120, HDL 45, LDL 81  ~  FLP 4/10 showed TChol 145, TG 90, HDL 41, LDL 86  ~  FLP 1/11 showed TChol 137, TG 87, HDL 33, LDL 86  DIVERTICULOSIS OF COLON (ICD-562.10) - last colonoscopy 1/08 by DrBrodie showed divertics, hems... f/u planned 51yrs... HEMORRHOIDS, INTERNAL (ICD-455.0)  ERECTILE DYSFUNCTION (ICD-302.72) - uses Viagra as needed.  Hx of GOUT (ICD-274.9) - on ALLOPURINOL 300mg /d since 4/10... he uses Indocin and Colchicine as needed...  ~  1/11:  bout of left knee pain/ tender, min swelling, not red- min better on Indocin/ Colchicine- therefore Rx w/ Depo/ Pred... Uric=5.3     Review of Systems       Patient denies fever, chills, headache, sweats, rash, change in vision, change in hearing, chest pain, cough, nausea vomiting, urinary symptoms.  All other systems are reviewed and are negative.  Vital Signs:  Patient profile:   75 year old male Height:      70 inches Weight:      171 pounds Pulse rate:   88 / minute BP sitting:   110 / 72  (left arm) Cuff size:   regular  Vitals Entered By: Carlye Grippe (August 31, 2009 1:21 PM)  Serial Vital Signs/Assessments:  Time      Position  BP       Pulse  Resp  Temp     By 1:23 PM             146/77   87                    Carlye Grippe  Comments: 1:23 PM right arm/reg cuff By: Carlye Grippe    Physical Exam  General:  patient looks great. Head:  head is atraumatic. Eyes:  no xanthelasma. Neck:  no jugular venous distention. Chest Wall:  no chest wall tenderness. Lungs:  lungs are clear respiratory effort is nonlabored. Heart:  cardiac exam reveals S1-S2.  No clicks or significant murmurs. Abdomen:  abdomen is soft. Extremities:  no peripheral edema. Psych:  patient is oriented to person time and place.  Affect is  normal.   Impression & Recommendations:  Problem # 1:  CORONARY ARTERY DISEASE (ICD-414.00)  His updated medication list for this problem includes:    Bufferin 325 Mg Tabs (Aspirin buf(cacarb-mgcarb-mgo)) .[name]... Take 1 tab by mouth once daily...    Plavix 75 Mg Tabs (Clopidogrel bisulfate) .[name]... Take 1 tab by mouth once daily    Toprol Xl 50 Mg Tb24 (Metoprolol  succinate) .[name]... Take 1/2 tab by mouth once daily    Nitrostat 0.4 Mg Subl (Nitroglycerin) ..... Use as directed for chest pain... Coronary disease is stable.  I've chosen not to do any further workup at this time. EKG is done and reviewed by me today.  There are minor nonspecific ST-T wave changes.  Problem # 2:  Hx of CAROTID BRUIT (ICD-785.9)  Patient has known carotid artery disease.  There is also history of some subclavian steal.  This explains his blood pressure differential.  We will do a followup carotid Doppler with arm blood pressures and full assessment of the subclavian steal.  At this point he has no symptoms.  I believe that he will not need any further workup but I need to see the Doppler data first.  It is important that he knows that his blood pressure should always be checked in the right arm.  Often see him for followup.  Orders: Carotid Duplex (Carotid Duplex)  Problem # 3:  HYPERTENSION (ICD-401.9)  His updated medication list for this problem includes:    Bufferin 325 Mg Tabs (Aspirin buf(cacarb-mgcarb-mgo)) .[name]... Take 1 tab by mouth once daily...    Toprol Xl 50 Mg Tb24 (Metoprolol succinate) .[name]... Take 1/2 tab by mouth once daily  Orders: EKG w/ Interpretation (93000) Blood pressure right arm is slightly elevated today.  No change in therapy yet.  Problem # 4:  * DIFFERENTIAL IN ARM BLOOD PRESSURES This is why he is here today.  This fits with the prior knowledge of subclavian stenosis on the left.  It is important that his blood pressure be measured in the right arm a regular basis. I am aware now that  Dr.Early has taken over the patient's vascular care.  I will send a copy of this note to him.  I will work with him over time if anything needs to be done about the patient's subclavian stenosis.  Patient Instructions: 1)  Your physician has requested that you have a carotid duplex. This test is an ultrasound of the carotid arteries in your neck. It looks at blood flow through these arteries that supply the brain with blood. Allow one hour for this exam. There are no restrictions or special instructions. 2)  Your physician recommends that you continue on your current medications as directed. Please refer to the Current Medication list given to you today. 3)  FOLLOW UP WITH DR. KATZ ON MONDAY, October 31, 2009 AT 9:15AM.

## 2010-02-22 NOTE — Consult Note (Signed)
Summary: [facility]  [facility]   Imported By: Sherian Rein 06/29/2009 10:23:30  _____________________________________________________________________  External Attachment:    Type:   Image     Comment:   External Document

## 2010-02-23 NOTE — Progress Notes (Signed)
Summary: may metoprolol succ be changed to metoprolol tart?  Phone Note Outgoing Call Call back at [phone]   Call placed by: Boone Master CNA/MA,  January 27, 2010 9:45 AM Call placed to: [facility] Summary of Call: received a letter from patient stating that he received notification from [facility] stating that they had been trying to contact our office since 12.15.11 and that he was told that this metoprolol succ 50mg  is a tier 3 medication.  pt requested that we contact [facility] to "answer their questions" and if we can change his toprol to a more cost effective med  Follow-up for Phone Call        called [facility], spoke with Dewayne Hatch, pharmacist.  she informed me that they do not need any type of clarification.  they received a rx electronically on 12.30.11 and pt's meds were shipped on 1.3.12.  Ann did state that:  metoprolol succ 50mg  prescribed once daily has a copay of $67.83.  if changed to metoprolol tartrate 25mg  two times a day (as an example) will only cost patient $6 for copay.  Dr. Kriste Basque, may patient be changed to metoprolol tartrate?  rx may be faxed per Dewayne Hatch, with a note that the rx is being changed d/t pt's insurance formulary. Follow-up by: Boone Master CNA/MA,  January 27, 2010 10:14 AM  Additional Follow-up for Phone Call Additional follow up Details #1::        per SN---change to Metoprolol tart  50mg    1 by mouth two times a day    either #60 or #180.  please let pt know that we have not received any forms from [facility].  the rx can be sent in for change.  thanks Randell Loop CMA  January 27, 2010 10:29 AM     Additional Follow-up for Phone Call Additional follow up Details #2::    LMOM for pt TCB regarding rx for Metoprolol.  Does pt want #60 or #180? Abigail Miyamoto RN  January 27, 2010 11:01 AM   LMOMTCB Vernie Murders  January 30, 2010 4:50 PM  Pt aware Rx called in. Zackery Barefoot CMA  January 31, 2010 10:29 AM   New/Updated  Medications: METOPROLOL TARTRATE 50 MG TABS (METOPROLOL TARTRATE) Take 1 tablet by mouth two times a day Prescriptions: METOPROLOL TARTRATE 50 MG TABS (METOPROLOL TARTRATE) Take 1 tablet by mouth two times a day  #180 x 1   Entered by:   Zackery Barefoot CMA   Authorized by:   Michele Mcalpine MD   Signed by:   Zackery Barefoot CMA on 01/31/2010   Method used:   Telephoned to ...       [facility]* (mail-order)       [address]       [place], [place]  [address]       Ph: [phone]       Fax: [phone]   RxID:   [id number]
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
## 2010-06-06 NOTE — Procedures (Signed)
CAROTID DUPLEX EXAM   INDICATION:  Bruit.   HISTORY:  Diabetes:  No.  Cardiac:  MI, stent.  Hypertension:  Yes.  Smoking:  Previous.  Previous Surgery:  No.  CV History:  No.  Amaurosis Fugax No, Paresthesias No, Hemiparesis No.                                       RIGHT             LEFT  Brachial systolic pressure:         128               104  Brachial Doppler waveforms:         WNL               WNL  Vertebral direction of flow:        Antegrade         Retrograde  DUPLEX VELOCITIES (cm/sec)  CCA peak systolic                   103               89  ECA peak systolic                   120               123  ICA peak systolic                   160               107  ICA end diastolic                   33                29  PLAQUE MORPHOLOGY:                  Heterogenous      Heterogenous  PLAQUE AMOUNT:                      Mild              Mild  PLAQUE LOCATION:                    ICA               ICA   IMPRESSION:  1. Bilateral internal carotid arteries suggest 40% to 59% stenosis.  2. Retrograde flow noted in left vertebral, which may indicate      subclavian steal.        ___________________________________________  Quita Skye. Hart [place], M.D.   CB/MEDQ  D:  09/07/2009  T:  09/07/2009  Job:  [id number]

## 2010-06-06 NOTE — Procedures (Signed)
DUPLEX ULTRASOUND OF ABDOMINAL AORTA   INDICATION:  Followup, abdominal aortic aneurysm.   HISTORY:  Diabetes:  No.  Cardiac:  CAD, MI.  Hypertension:  Yes.  Smoking:  Quit.  Connective Tissue Disorder:  Family History:  No.  Previous Surgery: No.   DUPLEX EXAM:         AP (cm)                   TRANSVERSE (cm)  Proximal             2.02 cm                   2.34 cm  Mid                  3.90 cm                   3.93 cm  Distal               2.5 cm                    2.6 cm  Right Iliac          1.16 cm                   1.22 cm  Left Iliac           1.04 cm                   1.19 cm   PREVIOUS:  Date: 09/30/06  AP:  3.63  TRANSVERSE:  3.73   IMPRESSION:  Fairly stable abdominal aortic aneurysm with the largest measurement of  3.90 cm X 3.93 cm.   ___________________________________________   P. Bud Face, M.D.   AS/MEDQ  D:  10/06/2007  T:  10/06/2007  Job:  [id number]

## 2010-06-06 NOTE — Procedures (Signed)
DUPLEX ULTRASOUND OF ABDOMINAL AORTA   INDICATION:  Followup, abdominal aortic aneurysm   HISTORY:  Diabetes:  No  Cardiac:  CAD with MI in 1988  Hypertension:  Yes  Smoking:  Quit  Connective Tissue Disorder:  Family History:  No  Previous Surgery:  No   DUPLEX EXAM:         AP (cm)                   TRANSVERSE (cm)  Proximal             2.29 Cm                   2.42 cm  Mid                  3.63 cm                   3.73 cm  Distal               2.34 cm                   2.44 cm  Right Iliac          0.89 cm                   0.94 cm  Left Iliac           0.86 cm                   0.89 cm   PREVIOUS:  Date: 09/20/2005  AP:  3.7  TRANSVERSE:  3.6   IMPRESSION:  Abdominal aortic aneurysm measurements appear stable from  previous study.   ___________________________________________  P. Liliane Bade, M.D.   AS/MEDQ  D:  09/30/2006  T:  09/30/2006  Job:  [id number]

## 2010-06-06 NOTE — Procedures (Signed)
DUPLEX ULTRASOUND OF ABDOMINAL AORTA   INDICATION:  Followup of abdominal aortic aneurysm.   HISTORY:  Diabetes:  No  Cardiac:  CAD, MI  Hypertension:  Yes  Smoking:  Previous  Family History:  No  Previous Surgery:  No   DUPLEX EXAM:         AP (cm)                   TRANSVERSE (cm)  Proximal             3 cm                      2.7 cm  Mid                  4.4 cm                    3.8 cm  Distal               2.6 cm                    2.8 cm  Right Iliac          1.4 cm                    2.1 cm  Left Iliac           1.1 cm                    1.5 cm   PREVIOUS:  Date:  AP:  4  TRANSVERSE:  4.2   IMPRESSION:  Abdominal aortic aneurysm with largest measurement at 4.4 x  3.8.   ___________________________________________  Larina Earthly, M.D.   CJ/MEDQ  D:  11/18/2008  T:  11/19/2008  Job:  [id number]

## 2010-06-06 NOTE — Procedures (Signed)
DUPLEX ULTRASOUND OF ABDOMINAL AORTA   INDICATION:  Follow up AAA.   HISTORY:  Diabetes:  No  Cardiac:  CAD, MI, stent.  Hypertension:  Yes  Smoking:  Previous  Family History:  No  Previous Surgery:  No   DUPLEX EXAM:         AP (cm)                   TRANSVERSE (cm)  Proximal             2.3 cm                    2.1 cm  Mid                  4.4 cm                    3.7 cm  Distal               2.7 cm                    3.1 cm  Right Iliac          1.7 cm                    1.7 cm  Left Iliac           1.3 cm                    1.5 cm   PREVIOUS:  Date:  AP:  4.4  TRANSVERSE:  3.8   IMPRESSION:  Abdominal aortic aneurysm with largest measurement at 4.4  cm x 3.7 cm.   ___________________________________________  Janetta Hora. Fields, MD   NT/MEDQ  D:  05/25/2009  T:  05/25/2009  Job:  [id number]

## 2010-06-06 NOTE — Procedures (Signed)
DUPLEX ULTRASOUND OF ABDOMINAL AORTA   INDICATION:  Followup abdominal aortic aneurysm.   HISTORY:  Diabetes:  No.  Cardiac:  CAD, MI.  Hypertension:  Yes.  Smoking:  Quit.  Connective Tissue Disorder:  Family History:  No.  Previous Surgery:  No.   DUPLEX EXAM:         AP (cm)                   TRANSVERSE (cm)  Proximal             2.46 cm                   2.87 cm  Mid                  4.02 cm                   4.19 cm  Distal               2.51 cm                   2.84 cm  Right Iliac          1.30 cm                   1.29 cm  Left Iliac           1.21 cm                   1.29 cm   PREVIOUS:  Date:  AP:  3.90  TRANSVERSE:  3.93   IMPRESSION:  Slight increase in abdominal aortic aneurysm since the last  study done on 10/06/2007.   ___________________________________________  P. Liliane Bade, M.D.   AC/MEDQ  D:  04/01/2008  T:  04/01/2008  Job:  [id number]

## 2010-06-06 NOTE — Procedures (Signed)
DUPLEX ULTRASOUND OF ABDOMINAL AORTA   INDICATION:  Follow up abdominal aortic aneurysm.   HISTORY:  Diabetes:  No.  Cardiac:  Coronary artery disease, MI, stent.  Hypertension:  Yes.  Smoking:  Previous.  Connective Tissue Disorder:  Family History:  No.  Previous Surgery:  No.   DUPLEX EXAM:         AP (cm)                   TRANSVERSE (cm)  Proximal             2.22 Cm                   2.49 cm  Mid                  4.10 cm                   3.8 cm  Distal               1.99 cm                   1.96 cm  Right Iliac          1.10 cm                   1.18 cm  Left Iliac           1.36 cm                   1.44 cm   PREVIOUS:  Date: 05/25/2009  AP:  4.4  TRANSVERSE:  3.7   IMPRESSION:  Abdominal aortic aneurysm noted with largest measurement of  4.10 x 3.8 cm.   ___________________________________________  Quita Skye Hart [place], M.D.   EM/MEDQ  D:  11/30/2009  T:  11/30/2009  Job:  [id number]

## 2010-06-06 NOTE — Assessment & Plan Note (Signed)
OFFICE VISIT   Justin, Lyons  DOB:  20-Mar-1935                                       05/25/2009  IRJJO#:[id number]   Patient returns for follow-up today.  Justin Lyons has previously been followed by  Dr. Madilyn Fireman for a small abdominal aortic aneurysm.  Since Dr. Madilyn Fireman has  left town, I am taking over his care.  Justin Lyons continues to deny any pain in  his abdomen or back.  Justin Lyons has some flare-up of left knee pain recently  but otherwise has been doing well.  Chronic medical problems include  hypertension, coronary artery disease, elevated cholesterol.  These are  all currently controlled and followed by Dr. Kriste Basque.  Last aneurysm  measurement was 4.4 cm in diameter in October 2009.  Justin Lyons has been  followed every 6 months for his aneurysm.  Justin Lyons reports no other new  medical problems.   FAMILY HISTORY:  Remarkable for his father and brothers, who had  coronary disease at young ages.   SOCIAL HISTORY:  Justin Lyons is widowed, has 3 children.  Justin Lyons is a former smoker,  quit in 1989.  Justin Lyons does not consume alcohol regularly.   A full 12-point review of systems was performed with the patient today.  Please see intake referral form for details regarding this.   MEDICATIONS:  Plavix 75 mg once a day, Zocor 80 mg once a day, Toprol 50  mg once a day, niacin 500 mg once a day, allopurinol, aspirin 325 mg  once a day.   Justin Lyons has allergy listed to penicillin.   PHYSICAL EXAMINATION:  Blood pressure is 144/72 in the right arm.  Heart  rate is 57 and regular.  Temperature is 98.3.  HEENT is unremarkable.  Neck has 2+ carotid pulses without bruit.  Chest:  Clear to  auscultation.  Cardiac:  Regular rate and rhythm without murmur.  Abdomen is soft, nontender, nondistended with a vaguely palpable  pulsatile mass in the epigastrium.  Justin Lyons has 2+ femoral, radial, 1+  popliteal, 2+ dorsalis pedis and posterior tibial pulses bilaterally.  Lower extremity musculoskeletal exam shows some point tenderness over  the inferior edge of the left patella.  There is no swelling.  There is  no erythema.  Range of motion is intact.  Justin Lyons has no asymmetry in the  size of left versus right.  Neurologic exam shows symmetric upper  extremity and lower extremity motor strength, which is 5/5.  Skin has no  open ulcers or rashes.   Overall patient is fairly stable from his medical and physical exam  standpoint.  Justin Lyons did have a repeat ultrasound today which shows the  aneurysm remains at 4.4 cm in diameter.  I discussed with him today that  if this ever reaches the 5 cm to 6 cm range, we would consider repair at  that point.  Otherwise, we will continue surveillance follow-up.  Justin Lyons  will return in 6 months' time for repeat ultrasound of his aorta.  Justin Lyons  was given a renewal of his Zocor prescription today.     Janetta Hora. Fields, MD  Electronically Signed   CEF/MEDQ  D:  05/25/2009  T:  05/25/2009  Job:  [id number]   cc:   Luis Abed, MD, [facility]  Scott M. Kriste Basque, MD

## 2010-06-09 NOTE — Assessment & Plan Note (Signed)
[facility]                               PULMONARY OFFICE NOTE   Justin Lyons, Justin Lyons                        MRN:          [id number]  DATE:11/07/2005                            DOB:          01-06-1936    HISTORY OF PRESENT ILLNESS:  The patient is a 75 year old white male patient  of Dr. Jodelle Green who presents for an acute office visit.  The patient  complains of a one-week history of productive cough, nasal congestion, and  general malaise.  The patient denies hemoptysis, chest pain, orthopnea, PND,  or leg swelling.  The patient has a history of hyperlipidemia, hypertension,  arteriosclerotic heart disease. He has not been seen by Dr. Kriste Basque in greater  than a year.  The patient reports he has been doing well and just did not  come in for a followup.   PAST MEDICAL HISTORY:  Reviewed.   CURRENT MEDICATIONS:  Reviewed.   PHYSICAL EXAMINATION:  GENERAL:  The patient is a pleasant, obese white male  in no acute distress.  VITAL SIGNS:  He is afebrile with stable vital signs.  Saturation is 97% on  room air.  HEENT:  Unremarkable.  NECK:  Supple.  No adenopathy.  No JVD.  LUNGS:  Lung sounds reveal diminished breath sounds at the bases; otherwise  clear.  CARDIAC:  Regular rate.  ABDOMEN:  Soft and benign.  EXTREMITIES:  Warm without any calf tenderness, cyanosis, clubbing or edema.   IMPRESSION AND PLAN:  1. Acute tracheobronchitis.  The patient is to begin Levaquin 750 mg for      five days, Mucinex DM twice daily.  The patient is to return here in      two weeks to Dr. Kriste Basque for followup or sooner if needed.  2. History of hyperlipidemia, hypertension.  Prescriptions were refilled      today and the patient is      to return for fasting labs.  The patient is reminded to keep his      followup appointment with Dr. Kriste Basque.      ______________________________  Rubye Oaks, NP    ______________________________  Lonzo Cloud. Kriste Basque, MD   TP/MedQ  DD:  11/08/2005  DT:  11/10/2005 Job #:  [id number]

## 2010-06-09 NOTE — Discharge Summary (Signed)
NAMENETHAN, CAUDILLO                             ACCOUNT NO.:  [id number]   MEDICAL RECORD NO.:  [id number]                   PATIENT TYPE:  INP   LOCATION:  2910                                 FACILITY:  [facility]   PHYSICIAN:  Salvadore Farber, M.D. [facility]         DATE OF BIRTH:  1935-11-29   DATE OF ADMISSION:  08/31/2003  DATE OF DISCHARGE:  09/03/2003                           DISCHARGE SUMMARY - REFERRING   DISCHARGE DIAGNOSES:  1. Lateral wall myocardial infarction.  2. Coronary artery disease.  3. Pulmonary nodule/atelectasis of the right base.  4. Fever felt secondary to number one.  5. Dyslipidemia, treated.  6. Abdominal aortic aneurysm, 3.8-cm, May 2005.  This needs to be restudied     in 2006.  7. Hypertension, treated.   HOSPITAL COURSE:  Mr. Miyasato is a 75 year old male patient with a history of  coronary artery disease.  He underwent angioplasty of a right coronary  artery and lesion in 1988.  He does have multiple risk factors including  hypertension, hyperlipidemia, and peripheral vascular disease.  Over the  past several days prior to his admission, he had had intermittent substernal  chest pain.  He eventually proceeded to the emergency room, was placed on IV  heparin, nitroglycerin, and aspirin.  An old EKG revealed T wave inversion  in the inferior leads in 1988; however, now this area shows resolution of  the flipped T waves with minimal ST segment elevation in the inferior leads.  He was then taken to the cardiac catheterization lab urgently, and he was  found to have a total right with bridging collateral.  There was a 50% mid  LAD lesion.  There was a proximal circumflex lesion that was reduced from an  80% to a 10% lesion post procedure, utilizing a TAXUS Express 2 stent.  The  patient was watched in the hospital over the next several days and had  recurrent chest pain on September 01, 2003.  He did return to the cath lab;  however, the stent was widely patent,  otherwise no change.  The patient did  have fevers after his MI, and a urinalysis was negative, although it did  show mild protein, 30.  A CT of the chest revealed atelectasis of the right  base.  It also revealed some filling defect of the spleen.  At this point,  Dr. Samule Ohm feels the patient's lung atelectasis/scarring and questionable  spleen changes should be followed by Dr. Kriste Basque.   We have recommended that he follow up with Dr. Kriste Basque in one week, and Dr.  Myrtis Ser, on October 01, 2003 at 12 p.m.   He was a participant in the [employer] research study drug.  He is also to take  the following medications:  1. Toprol XL 50 mg a day.  2. Lipitor 80 mg q.h.s.  3. Coated aspirin 325 mg daily.  4. Colchicine.  He is to stop his Cardizem  and Zocor.  __________  Tylenol.  Use sublingual  nitroglycerin p.r.n. chest pain.   No straining or lifting over 10 pounds.  No driving for two weeks.  Remain on a low-fat diet.  Clean catheter site with soap and water, no scrubbing.  Participate in cardiac rehabilitation.      Guy Franco, P.A. [facility]                      Salvadore Farber, M.D. [facility]    LB/MEDQ  D:  09/03/2003  T:  09/03/2003  Job:  [id number]   cc:   Lonzo Cloud. Kriste Basque, M.D. [facility]   Willa Rough, M.D.

## 2010-06-09 NOTE — Assessment & Plan Note (Signed)
[facility]                         GASTROENTEROLOGY OFFICE NOTE   TYRONE, PAUTSCH                        MRN:          [id number]  DATE:01/23/2005                            DOB:          [birth year]    Justin Lyons is a 75 year old gentleman with family history of colon  cancer on his mother who is still alive at age of 46.  Justin Lyons is due  for colonoscopy.  He has had two previous colonoscopy's, first in 1990  which was entirely normal, follow up colonoscopy in 2002 showed mild  diverticulosis of the left colon.  He currently denies any  gastrointestinal symptoms.  His bowel have been sounding regular.  He  has had no rectal bleeding or abdominal pain.   MEDICATIONS:  1. Plavix 75 mg Po QD.  2. Aspirin 325 mg Po QD.  3. Lipitor 80 mg Po QD.  4. Niaspan.  5. Toprol XL 50 mg Po QD.  6. Nitroglycerin.  P.r.n. medications are Indocin and colchicine.   PHYSICAL EXAMINATION:  VITAL SIGNS:  Blood pressure 104/58, pulse 68,  weight 167 pounds.  Patient was not examined.   IMPRESSION:  This is a 75 year old gentleman with family history of  colon cancer and risk factors for colon cancer without having any  symptoms.  He is on anticoagulant which needs to be kept.  He needs to  stay on because of presence of coronary artery stents placed in 2005 by  Dr. Samule Ohm.  His risk of having colon cancer are very low and therefore,  I would keep him on Plavix and Aspirin for the colonoscopy.  He  understands that if we found large polyps or multiple polyps that I  would not be able to remove them without stopping Aspirin and Plavix  first, but it would have to be discussed with Dr. Samule Ohm.  Patient  agreed to the plan.  Colonoscopy has been scheduled with using routine  colonoscopy prep.     Hedwig Morton. Juanda Chance, MD  Electronically Signed    DMB/MedQ  DD: 01/23/2006  DT: 01/23/2006  Job #: [id number]   cc:   Salvadore Farber, MD  Lonzo Cloud. Kriste Basque, MD

## 2010-06-09 NOTE — Cardiovascular Report (Signed)
NAMEJARMON, Justin Lyons                             ACCOUNT NO.:  [id number]   MEDICAL RECORD NO.:  [id number]                   PATIENT TYPE:  INP   LOCATION:  2910                                 FACILITY:  [facility]   PHYSICIAN:  Charlies Constable, M.D. [facility]              DATE OF BIRTH:  [birth year]   DATE OF PROCEDURE:  09/01/2003  DATE OF DISCHARGE:                              CARDIAC CATHETERIZATION   PROCEDURE:  Selective coronary angiogram.   CLINICAL HISTORY:  Justin Lyons is 75 years old and had a remote PTCA of the  right coronary artery by Dr. Nicki Guadalajara and was admitted yesterday with  chest pain and minimal ST elevation in the inferior leads.  He was studied  by Dr. Salvadore Farber and found to have a chronically totally occluded  right coronary artery which was small, and 80% to 90% stenosis near the  ostium of the circumflex artery with 70% to 80% ostial stenosis of the first  marginal branch.  Dr. Samule Ohm stented the proximal circumflex artery with a  TAXUS stent, feeling this was the culprit vessel.  The patient developed  some recurrent chest discomfort last night and today, and for this reason  was brought back to the laboratory for reevaluation.  His EKG showed slight  inferior ST elevation.   PROCEDURE:  The procedure was performed via the left femoral artery using  arterial sheath and a left diagnostic catheter.  Femoral arterial puncture  was performed and Omnipaque contrast was used.  The left femoral artery was  closed with AngioSeal at the end of the procedure.  The patient tolerated  the procedure well and left the laboratory in satisfactory condition.   RESULTS:  The stent in the proximal circumflex artery which crossed the  first marginal branch was widely patent with no stenosis.  The first  marginal branch was narrowed at the ostium to 70% to 80%.  This appeared  better than on the immediate post-stenting film from yesterday.  There was  also a 30% to 40% narrowing  downstream in the circumflex artery.   CONCLUSION:  Widely patent stent in the proximal circumflex artery with 80%  narrowing in a side branch, but with brisk TIMI-3 flow.   RECOMMENDATIONS:  The stent is widely patent and the side branch has brisk  TIMI flow and the ostial stenosis appears better than the post-procedure  films.  I think it is very unlikely that the patient's symptoms are related  to the marginal branch narrowing.  His symptoms may be related to his Plavix  and we will plan to treat him with Pepcid and continued observation.  Charlies Constable, M.D. [facility]    BB/MEDQ  D:  09/01/2003  T:  09/02/2003  Job:  [id number]   cc:   Lonzo Cloud. Kriste Basque, M.D. [facility]   Willa Rough, M.D.   Salvadore Farber, M.D. [facility]  [address]  [address]  [place]  [place] [address]   [facility]

## 2010-06-09 NOTE — Cardiovascular Report (Signed)
NAMETIRRELL, BUCHBERGER NO.:  [id number]   MEDICAL RECORD NO.:  [id number]                   PATIENT TYPE:  INP   LOCATION:  2910                                 FACILITY:  [facility]   PHYSICIAN:  Salvadore Farber, M.D. [facility]         DATE OF BIRTH:  02/02/1935   DATE OF PROCEDURE:  08/31/2003  DATE OF DISCHARGE:                              CARDIAC CATHETERIZATION   PROCEDURE:  Left heart catheterization, left ventriculography, coronary  angiography, drug-eluting stent placement in the proximal circumflex.   INDICATIONS:  [name] is a 75 year old gentleman status post inferior  myocardial infarction in 1988 treated with balloon angioplasty.  He has done  well from a cardiac viewpoint since then until the past two days when he has  had brief episodes of substernal chest discomfort associated with nausea and  a single episode of emesis.  He had an episode lasting more than an hour  this morning and presented to the emergency room.  Here he has been treated  with aspirin, heparin and intravenous nitroglycerin.  Despite this therapy  he has had recurrence of chest discomfort.  Electrocardiogram demonstrates  approximately 0.5 mm of inferior ST elevation.  Troponin is mildly elevated.  He is clearly having acute coronary syndrome refractory to medical therapy  and was brought urgently to the cardiac catheterization laboratory.   PROCEDURAL TECHNIQUE:  Informed consent was obtained.  Under 1% lidocaine  local anesthesia a 6-French sheath was placed in the right common femoral  artery using the modified Seldinger technique.  Diagnostic angiography and  ventriculography were performed using JL4, JR4, and pigtail catheters.  The  case then proceeded to intervention.   Double bolus of eptifibatide was administered.  Additional heparin was given  to achieve and maintain an ACT of greater than 200 seconds.  Sheath was up  sized to 7-French.  A 7-French CLS3.5 guide  was advanced over a wire and  engaged in the ostium of the left main.  A Luge wire was advanced into the  distal circumflex and a second Luge wire advanced into the distal portion of  the first obtuse marginal branch.  I began with pre dilation of the first  marginal due to severe stenosis there that would be further jeopardized by  treatment of the culprit in the circumflex proper.  A 2.0 x 9 mm Maverick  was inflated to 6 atmospheres for one minute.  There was no significant  change in the stenosis with this.  Attention was then turned to the culprit  in the AV groove circumflex.  The same 2.0 x 9 mm Maverick was then  manipulated across this lesion and used to pre dilate at 6 atmospheres for  30 seconds.  The lesion was then stented using a 2.75 x 16 mm Taxus at 16  atmospheres.  Final angiography demonstrated no residual stenosis and TIMI 3  flow to the distal vasculature.  The jailed first obtuse marginal had a  residual 70% stenosis with TIMI 3 flow.  The patient tolerated the procedure  well and was transferred to the holding room in stable condition.   COMPLICATIONS:  None.   FINDINGS:  1. LV 114/5/13.  EF approximately 60% without regional wall motion     abnormality in an RAO projection.  2. No aortic stenosis or mitral regurgitation.  3. Left main:  Angiographically normal.  4. LAD:  Large vessel wrapping the apex.  There is a 50% stenosis of the     proximal vessel.  5. Circumflex:  Hazy, approximately 80% stenosis of the proximal vessel     involving the takeoff of moderate sized first obtuse marginal.  This     first marginal had an 80% stenosis.  The AV groove circumflex was stented     to no residual stenosis.  There remained a 70% stenosis in the obtuse     marginal.  TIMI 3 flow was maintained in both the circumflex proper and     the first obtuse marginal.  6. RCA:  Moderate sized, dominant vessel.  There is a total occlusion of the     mid vessel after the takeoff  of the acute marginal.  There are bridging     collaterals and modest left to right collaterals.  The bridging     collaterals are very suggestive of chronic total occlusion.  Films are     reviewed with Dr. Juanda Chance who agreed.   IMPRESSION/RECOMMENDATIONS:  Successful drug-eluting stent placement in the  culprit lesion of the proximal circumflex.  The patient tolerated the  procedure well.  Eptifibatide will be continued for 18 hours.  Sheaths will  be removed when the ACT is less than 175 seconds.   The patient has agreed to participate in the TRITON study comparing Plavix  with a novel thienopyridine.  He will be continued on this study drug for  approximately one year.  He, thus, should not receive any Plavix through  that period.  Aspirin will be continued indefinitely.                                               Salvadore Farber, M.D. [facility]    WED/MEDQ  D:  08/31/2003  T:  09/01/2003  Job:  [id number]   cc:   Willa Rough, M.D.   Lonzo Cloud. Kriste Basque, M.D. [facility]

## 2010-06-29 ENCOUNTER — Ambulatory Visit: Payer: Self-pay | Admitting: Pulmonary Disease

## 2010-07-12 ENCOUNTER — Encounter: Payer: Self-pay | Admitting: Pulmonary Disease

## 2010-07-12 ENCOUNTER — Ambulatory Visit (INDEPENDENT_AMBULATORY_CARE_PROVIDER_SITE_OTHER): Payer: Medicare Other | Admitting: Pulmonary Disease

## 2010-07-12 ENCOUNTER — Ambulatory Visit (INDEPENDENT_AMBULATORY_CARE_PROVIDER_SITE_OTHER)
Admission: RE | Admit: 2010-07-12 | Discharge: 2010-07-12 | Disposition: A | Discharge status: Home / Self Care | Payer: Medicare Other | Source: Ambulatory Visit | Attending: Pulmonary Disease | Admitting: Pulmonary Disease

## 2010-07-12 DIAGNOSIS — I2510 Atherosclerotic heart disease of native coronary artery without angina pectoris: Secondary | ICD-10-CM

## 2010-07-12 DIAGNOSIS — M109 Gout, unspecified: Secondary | ICD-10-CM

## 2010-07-12 DIAGNOSIS — T148XXA Other injury of unspecified body region, initial encounter: Secondary | ICD-10-CM

## 2010-07-12 DIAGNOSIS — I739 Peripheral vascular disease, unspecified: Secondary | ICD-10-CM

## 2010-07-12 DIAGNOSIS — J449 Chronic obstructive pulmonary disease, unspecified: Secondary | ICD-10-CM

## 2010-07-12 DIAGNOSIS — E785 Hyperlipidemia, unspecified: Secondary | ICD-10-CM

## 2010-07-12 DIAGNOSIS — I10 Essential (primary) hypertension: Secondary | ICD-10-CM

## 2010-07-12 DIAGNOSIS — J209 Acute bronchitis, unspecified: Secondary | ICD-10-CM

## 2010-07-12 MED ORDER — SIMVASTATIN 40 MG PO TABS: 40.0000 mg | ORAL_TABLET | Freq: Every evening | ORAL | Status: DC

## 2010-07-12 MED ORDER — MOXIFLOXACIN HCL 400 MG PO TABS: 400.0000 mg | ORAL_TABLET | Freq: Every day | ORAL | Status: AC

## 2010-07-12 NOTE — Patient Instructions (Signed)
Today we updated your med list in EPIC>    We decided to finish out your Lipitor & then change to SIMVASTATIN 40mg  1 tab daily (to save $$)...    We also wrote a new prescription for AVELOX to take one daily til gone for the bronchitis...  Today we did your follow up CXR; please return to our lab one morning soon for your fasting blood work...    Then please call the PHONE TREE in a few days for your results...    Dial [id number] & when prompted enter your patient number followed by the # symbol...    Your patient number is:  [id number]  Call for any questions...  Let's plan another routine follow up in 6 months, sooner if needed for problems.Marland KitchenMarland Kitchen
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
## 2010-07-15 ENCOUNTER — Encounter: Payer: Self-pay | Admitting: Pulmonary Disease

## 2010-07-20 ENCOUNTER — Other Ambulatory Visit (INDEPENDENT_AMBULATORY_CARE_PROVIDER_SITE_OTHER): Payer: Medicare Other

## 2010-07-20 DIAGNOSIS — I10 Essential (primary) hypertension: Secondary | ICD-10-CM

## 2010-07-20 DIAGNOSIS — J209 Acute bronchitis, unspecified: Secondary | ICD-10-CM

## 2010-07-20 DIAGNOSIS — E785 Hyperlipidemia, unspecified: Secondary | ICD-10-CM

## 2010-07-20 DIAGNOSIS — N139 Obstructive and reflux uropathy, unspecified: Secondary | ICD-10-CM

## 2010-07-20 DIAGNOSIS — I2510 Atherosclerotic heart disease of native coronary artery without angina pectoris: Secondary | ICD-10-CM

## 2010-07-20 LAB — CBC WITH DIFFERENTIAL/PLATELET
Basophils Absolute: 0.1 10*3/uL (ref 0.0–0.1)
Basophils Relative: 0.6 % (ref 0.0–3.0)
Eosinophils Absolute: 0.5 10*3/uL (ref 0.0–0.7)
Eosinophils Relative: 5.9 % — ABNORMAL HIGH (ref 0.0–5.0)
HCT: 42.7 % (ref 39.0–52.0)
Hemoglobin: 14.5 g/dL (ref 13.0–17.0)
Lymphocytes Relative: 16 % (ref 12.0–46.0)
Lymphs Abs: 1.3 10*3/uL (ref 0.7–4.0)
MCHC: 33.9 g/dL (ref 30.0–36.0)
MCV: 96.4 fl (ref 78.0–100.0)
Monocytes Absolute: 0.7 10*3/uL (ref 0.1–1.0)
Monocytes Relative: 9 % (ref 3.0–12.0)
Neutro Abs: 5.6 10*3/uL (ref 1.4–7.7)
Neutrophils Relative %: 68.5 % (ref 43.0–77.0)
Platelets: 164 10*3/uL (ref 150.0–400.0)
RBC: 4.43 Mil/uL (ref 4.22–5.81)
RDW: 14.4 % (ref 11.5–14.6)
WBC: 8.1 10*3/uL (ref 4.5–10.5)

## 2010-07-20 LAB — HEPATIC FUNCTION PANEL
ALT: 28 U/L (ref 0–53)
AST: 27 U/L (ref 0–37)
Albumin: 3.8 g/dL (ref 3.5–5.2)
Alkaline Phosphatase: 79 U/L (ref 39–117)
Bilirubin, Direct: 0.2 mg/dL (ref 0.0–0.3)
Total Bilirubin: 0.9 mg/dL (ref 0.3–1.2)
Total Protein: 6.5 g/dL (ref 6.0–8.3)

## 2010-07-20 LAB — LIPID PANEL
Cholesterol: 116 mg/dL (ref 0–200)
HDL: 38.4 mg/dL — ABNORMAL LOW (ref >39.00)
LDL Cholesterol: 60 mg/dL (ref 0–99)
Total CHOL/HDL Ratio: 3
Triglycerides: 87 mg/dL (ref 0.0–149.0)
VLDL: 17.4 mg/dL (ref 0.0–40.0)

## 2010-07-20 LAB — BASIC METABOLIC PANEL
BUN: 13 mg/dL (ref 6–23)
CO2: 28 mEq/L (ref 19–32)
Calcium: 8.9 mg/dL (ref 8.4–10.5)
Chloride: 105 mEq/L (ref 96–112)
Creatinine, Ser: 1 mg/dL (ref 0.4–1.5)
GFR: 79.61 mL/min (ref >60.00)
Glucose, Bld: 104 mg/dL — ABNORMAL HIGH (ref 70–99)
Potassium: 4.1 mEq/L (ref 3.5–5.1)
Sodium: 139 mEq/L (ref 135–145)

## 2010-07-20 LAB — PSA: PSA: 0.78 ng/mL (ref 0.10–4.00)

## 2010-07-20 LAB — TSH: TSH: 1.27 u[IU]/mL (ref 0.35–5.50)

## 2010-11-03 ENCOUNTER — Telehealth: Payer: Self-pay | Admitting: Pulmonary Disease

## 2010-11-03 NOTE — Telephone Encounter (Signed)
[email or web address] to confirm medications needed to be sent

## 2010-11-06 NOTE — Telephone Encounter (Signed)
PT RETURNED CALL FROM TRIAGE. Kathleen W Perdue  °

## 2010-11-06 NOTE — Telephone Encounter (Signed)
Pt returning call says he's at work and he will call back later.Justin Lyons

## 2010-11-06 NOTE — Telephone Encounter (Signed)
Pt is to call back later today and will wait for his call back

## 2010-11-06 NOTE — Telephone Encounter (Signed)
[email or web address]  

## 2010-11-07 MED ORDER — ALLOPURINOL 300 MG PO TABS: 300.0000 mg | ORAL_TABLET | Freq: Every day | ORAL | Status: DC

## 2010-11-07 MED ORDER — METOPROLOL TARTRATE 50 MG PO TABS: 50.0000 mg | ORAL_TABLET | Freq: Two times a day (BID) | ORAL | Status: DC

## 2010-11-07 NOTE — Telephone Encounter (Signed)
Patient calling back.  [phone]

## 2010-11-07 NOTE — Telephone Encounter (Signed)
Pt aware 90 day supply sent to the pharmacy

## 2010-12-04 ENCOUNTER — Encounter: Payer: Self-pay | Admitting: Vascular Surgery

## 2010-12-05 ENCOUNTER — Ambulatory Visit (INDEPENDENT_AMBULATORY_CARE_PROVIDER_SITE_OTHER): Payer: Medicare Other | Admitting: Vascular Surgery

## 2010-12-05 ENCOUNTER — Encounter (INDEPENDENT_AMBULATORY_CARE_PROVIDER_SITE_OTHER): Payer: Medicare Other | Admitting: *Deleted

## 2010-12-05 ENCOUNTER — Encounter: Payer: Self-pay | Admitting: Vascular Surgery

## 2010-12-05 VITALS — BP 142/65 | HR 83 | Resp 20 | Ht 70.0 in | Wt 171.0 lb

## 2010-12-05 DIAGNOSIS — I714 Abdominal aortic aneurysm, without rupture: Secondary | ICD-10-CM

## 2010-12-05 NOTE — Progress Notes (Signed)
Subjective:     Patient ID: Justin Lyons, male   DOB: 09/17/1935, 75 y.o.   MRN: [id number]  HPI this 75 year old male patient returns for followup regarding his abdominal aortic aneurysm. This has been followed for the past few years with no significant change in size. He denies any new symptoms such as abdominal or back pain. Previous size of the aneurysm in November of 2011 was 4.1 cm. Today I ordered a duplex scan of the abdominal aortic aneurysm which I reviewed and interpreted. Is no change in the size of the aneurysm today measuring 4.08 cm in maximum diameter.  Past Medical History  Diagnosis Date  . COPD (chronic obstructive pulmonary disease)   . Hypertension   . Coronary artery disease   . Peripheral vascular disease   . Carotid bruit   . Hyperlipidemia   . Diverticulosis of colon   . Hemorrhoids, internal   . Erectile dysfunction   . History of gout   . Knee pain   . History of compression fracture of spine     History  Substance Use Topics  . Smoking status: Former Smoker -- 1.5 packs/day for 20 years    Types: Cigarettes    Quit date: 01/22/1982  . Smokeless tobacco: Never Used  . Alcohol Use: Yes     social use    No family history on file.  Allergies  Allergen Reactions  . Doxycycline     REACTION: rash  . Penicillins     REACTION: rash  . Sulfonamide Derivatives     REACTION: rash    Current outpatient prescriptions:allopurinol (ZYLOPRIM) 300 MG tablet, Take 1 tablet (300 mg total) by mouth daily., Disp: 90 tablet, Rfl: 3;  aspirin 325 MG tablet, Take 325 mg by mouth daily.  , Disp: , Rfl: ;  Cholecalciferol (VITAMIN D) 1000 UNITS capsule, Take 1,000 Units by mouth daily.  , Disp: , Rfl: ;  clopidogrel (PLAVIX) 75 MG tablet, Take 75 mg by mouth daily.  , Disp: , Rfl:  indomethacin (INDOCIN) 25 MG capsule, Take 25 mg by mouth 3 (three) times daily with meals. As needed for gout pain , Disp: , Rfl: ;  metoprolol (LOPRESSOR) 50 MG tablet, Take 1 tablet (50 mg  total) by mouth 2 (two) times daily., Disp: 180 tablet, Rfl: 3;  nitroGLYCERIN (NITROSTAT) 0.4 MG SL tablet, Place 0.4 mg under the tongue every 5 (five) minutes as needed.  , Disp: , Rfl: ;  sildenafil (VIAGRA) 100 MG tablet, Use as directed , Disp: , Rfl:  simvastatin (ZOCOR) 40 MG tablet, Take 1 tablet (40 mg total) by mouth every evening., Disp: 90 tablet, Rfl: 4  BP 142/65  Pulse 83  Resp 20  Ht 5\' 10"  (1.778 m)  Wt 171 lb (77.565 kg)  BMI 24.54 kg/m2  Body mass index is 24.54 kg/(m^2).        Review of Systems today he denies chest pain, dyspnea on exertion, PND, orthopnea, lateralizing weakness, amaurosis fugax, diplopia, blurred vision, syncope, claudication. All systems are negative and a complete review of systems     Objective:   Physical Exam blood pressure 142/65 heart rate 83 respirations 20 General he is a well-developed well-nourished male no apparent distress alert and oriented x3 HEENT normal for age Lungs no rhonchi or wheezing Cardiovascular regular rhythm no murmurs carotid pulses 3+ and no bruits audible Abdomen soft nontender. There is a 4 cm pulsatile mass in the midepigastrium. Lower extremity exam reveals 3+ femoral popliteal  and dorsalis pedis pulses palpable bilaterally Neurologic normal Musculoskeletal 3 major deformities Skin free of rash    Assessment:    abdominal aortic aneurysm measuring approximately 4.1 cm maximum diameter-stable and asymptomatic    Plan:     Return in one year with duplex scan of the aneurysm to continue to monitor size of abdominal aortic aneurysm

## 2010-12-11 NOTE — Procedures (Unsigned)
DUPLEX ULTRASOUND OF ABDOMINAL AORTA  INDICATION:  Follow up AAA.  HISTORY: Diabetes:  No. Cardiac:  Yes. Hypertension:  Yes. Smoking:  Previous. Connective Tissue Disorder: Family History:  No. Previous Surgery:  No.  DUPLEX EXAM:         AP (cm)                   TRANSVERSE (cm) Proximal             2.39 cm                   2.29 cm Mid                  4.08 cm                   4.02 cm Distal               2.35 cm                   2.41 cm Right Iliac          1.04 cm Left Iliac           0.95 cm  PREVIOUS:  Date: 11/30/09  AP:  4.1  TRANSVERSE:  3.8  IMPRESSION:  Stable abdominal aortic aneurysm measuring approximately 4.08 cm X 4.02 cm on today's examination.  ___________________________________________ Quita Skye Hart [place], M.D.  LT/MEDQ  D:  12/05/2010  T:  12/05/2010  Job:  [id number]

## 2011-01-10 ENCOUNTER — Ambulatory Visit: Payer: Medicare Other | Admitting: Pulmonary Disease

## 2011-03-08 ENCOUNTER — Encounter: Payer: Self-pay | Admitting: Internal Medicine

## 2011-03-15 ENCOUNTER — Ambulatory Visit (INDEPENDENT_AMBULATORY_CARE_PROVIDER_SITE_OTHER): Payer: Medicare Other | Admitting: Pulmonary Disease

## 2011-03-15 ENCOUNTER — Encounter: Payer: Self-pay | Admitting: Pulmonary Disease

## 2011-03-15 DIAGNOSIS — I10 Essential (primary) hypertension: Secondary | ICD-10-CM

## 2011-03-15 DIAGNOSIS — T148XXA Other injury of unspecified body region, initial encounter: Secondary | ICD-10-CM

## 2011-03-15 DIAGNOSIS — I2510 Atherosclerotic heart disease of native coronary artery without angina pectoris: Secondary | ICD-10-CM

## 2011-03-15 DIAGNOSIS — M109 Gout, unspecified: Secondary | ICD-10-CM

## 2011-03-15 DIAGNOSIS — K5730 Diverticulosis of large intestine without perforation or abscess without bleeding: Secondary | ICD-10-CM

## 2011-03-15 DIAGNOSIS — I739 Peripheral vascular disease, unspecified: Secondary | ICD-10-CM

## 2011-03-15 DIAGNOSIS — J449 Chronic obstructive pulmonary disease, unspecified: Secondary | ICD-10-CM

## 2011-03-15 DIAGNOSIS — E785 Hyperlipidemia, unspecified: Secondary | ICD-10-CM

## 2011-03-15 MED ORDER — SIMVASTATIN 40 MG PO TABS: 40.0000 mg | ORAL_TABLET | Freq: Every evening | ORAL | Status: DC

## 2011-03-15 MED ORDER — CLOPIDOGREL BISULFATE 75 MG PO TABS: 75.0000 mg | ORAL_TABLET | Freq: Every day | ORAL | Status: DC

## 2011-03-15 MED ORDER — ALLOPURINOL 300 MG PO TABS: 300.0000 mg | ORAL_TABLET | Freq: Every day | ORAL | Status: DC

## 2011-03-15 MED ORDER — METOPROLOL TARTRATE 50 MG PO TABS: 50.0000 mg | ORAL_TABLET | Freq: Two times a day (BID) | ORAL | Status: DC
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
## 2011-03-15 NOTE — Patient Instructions (Signed)
Today we updated your med list in our EPIC system...    Continue your current medications the same...  We discussed trying to eliminate your cough w/ a combination of MUCINEX 2tabs twice daily w/ lots of fluids, and DELSYM 2 tsp twice daily as a cough suppressant...  Call for any questions...  Let's plan a follow up visit in 6 months w/ FASTING blood work at that time.Marland KitchenMarland Kitchen

## 2011-09-13 ENCOUNTER — Other Ambulatory Visit (INDEPENDENT_AMBULATORY_CARE_PROVIDER_SITE_OTHER): Payer: Medicare Other

## 2011-09-13 ENCOUNTER — Ambulatory Visit (INDEPENDENT_AMBULATORY_CARE_PROVIDER_SITE_OTHER): Payer: Medicare Other | Admitting: Pulmonary Disease

## 2011-09-13 ENCOUNTER — Encounter: Payer: Self-pay | Admitting: Pulmonary Disease

## 2011-09-13 VITALS — BP 116/62 | HR 60 | Temp 96.9°F | Ht 70.0 in | Wt 171.0 lb

## 2011-09-13 DIAGNOSIS — N139 Obstructive and reflux uropathy, unspecified: Secondary | ICD-10-CM

## 2011-09-13 DIAGNOSIS — I10 Essential (primary) hypertension: Secondary | ICD-10-CM

## 2011-09-13 DIAGNOSIS — N320 Bladder-neck obstruction: Secondary | ICD-10-CM

## 2011-09-13 DIAGNOSIS — E785 Hyperlipidemia, unspecified: Secondary | ICD-10-CM

## 2011-09-13 DIAGNOSIS — F411 Generalized anxiety disorder: Secondary | ICD-10-CM

## 2011-09-13 DIAGNOSIS — F419 Anxiety disorder, unspecified: Secondary | ICD-10-CM

## 2011-09-13 DIAGNOSIS — I739 Peripheral vascular disease, unspecified: Secondary | ICD-10-CM

## 2011-09-13 DIAGNOSIS — K5730 Diverticulosis of large intestine without perforation or abscess without bleeding: Secondary | ICD-10-CM

## 2011-09-13 DIAGNOSIS — M109 Gout, unspecified: Secondary | ICD-10-CM

## 2011-09-13 DIAGNOSIS — I2510 Atherosclerotic heart disease of native coronary artery without angina pectoris: Secondary | ICD-10-CM

## 2011-09-13 DIAGNOSIS — T148XXA Other injury of unspecified body region, initial encounter: Secondary | ICD-10-CM

## 2011-09-13 LAB — CBC WITH DIFFERENTIAL/PLATELET
Basophils Relative: 0.7 % (ref 0.0–3.0)
Eosinophils Absolute: 0.6 10*3/uL (ref 0.0–0.7)
MCHC: 32.8 g/dL (ref 30.0–36.0)
MCV: 96.6 fl (ref 78.0–100.0)
Monocytes Absolute: 0.6 10*3/uL (ref 0.1–1.0)
Neutrophils Relative %: 67.9 % (ref 43.0–77.0)
RBC: 4.89 Mil/uL (ref 4.22–5.81)

## 2011-09-13 LAB — LIPID PANEL
LDL Cholesterol: 74 mg/dL (ref 0–99)
Total CHOL/HDL Ratio: 4

## 2011-09-13 LAB — BASIC METABOLIC PANEL
Chloride: 103 mEq/L (ref 96–112)
Creatinine, Ser: 0.9 mg/dL (ref 0.4–1.5)
Potassium: 4.5 mEq/L (ref 3.5–5.1)
Sodium: 137 mEq/L (ref 135–145)

## 2011-09-13 LAB — HEPATIC FUNCTION PANEL
ALT: 25 U/L (ref 0–53)
AST: 25 U/L (ref 0–37)
Alkaline Phosphatase: 72 U/L (ref 39–117)
Bilirubin, Direct: 0.2 mg/dL (ref 0.0–0.3)
Total Bilirubin: 1.3 mg/dL — ABNORMAL HIGH (ref 0.3–1.2)

## 2011-09-13 LAB — PSA: PSA: 0.72 ng/mL (ref 0.10–4.00)
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
## 2011-09-13 NOTE — Patient Instructions (Addendum)
Today we updated your med list in our EPIC system...    Continue your current medications the same...  Today we did your follow up FASTING blood work...    We will call you w/ the results...  Keep up the good work w/ your diet & exercise program  Call for any questions...  Let's plan a follow up visit in 6 months' time.Marland KitchenMarland Kitchen

## 2011-09-19 ENCOUNTER — Other Ambulatory Visit: Payer: Self-pay | Admitting: Pulmonary Disease

## 2011-09-19 DIAGNOSIS — E785 Hyperlipidemia, unspecified: Secondary | ICD-10-CM

## 2011-10-29 ENCOUNTER — Encounter: Payer: Self-pay | Admitting: Internal Medicine

## 2011-11-30 ENCOUNTER — Other Ambulatory Visit: Payer: Self-pay | Admitting: *Deleted

## 2011-11-30 DIAGNOSIS — I714 Abdominal aortic aneurysm, without rupture: Secondary | ICD-10-CM

## 2011-12-04 ENCOUNTER — Encounter: Payer: Self-pay | Admitting: Neurosurgery

## 2011-12-05 ENCOUNTER — Other Ambulatory Visit: Payer: Self-pay | Admitting: *Deleted

## 2011-12-05 ENCOUNTER — Encounter (INDEPENDENT_AMBULATORY_CARE_PROVIDER_SITE_OTHER): Payer: Medicare Other | Admitting: *Deleted

## 2011-12-05 ENCOUNTER — Encounter: Payer: Self-pay | Admitting: Neurosurgery

## 2011-12-05 ENCOUNTER — Ambulatory Visit (INDEPENDENT_AMBULATORY_CARE_PROVIDER_SITE_OTHER): Payer: Medicare Other | Admitting: Neurosurgery

## 2011-12-05 VITALS — BP 143/79 | HR 59 | Resp 16 | Ht 70.0 in | Wt 173.0 lb

## 2011-12-05 DIAGNOSIS — I714 Abdominal aortic aneurysm, without rupture, unspecified: Secondary | ICD-10-CM
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
## 2012-03-21 ENCOUNTER — Ambulatory Visit (INDEPENDENT_AMBULATORY_CARE_PROVIDER_SITE_OTHER): Payer: Medicare Other | Admitting: Pulmonary Disease

## 2012-03-21 ENCOUNTER — Encounter: Payer: Self-pay | Admitting: Pulmonary Disease

## 2012-03-21 VITALS — BP 110/66 | HR 63 | Temp 97.2°F | Ht 70.0 in | Wt 171.8 lb

## 2012-03-21 DIAGNOSIS — I10 Essential (primary) hypertension: Secondary | ICD-10-CM

## 2012-03-21 DIAGNOSIS — K5730 Diverticulosis of large intestine without perforation or abscess without bleeding: Secondary | ICD-10-CM

## 2012-03-21 DIAGNOSIS — F528 Other sexual dysfunction not due to a substance or known physiological condition: Secondary | ICD-10-CM

## 2012-03-21 DIAGNOSIS — J449 Chronic obstructive pulmonary disease, unspecified: Secondary | ICD-10-CM

## 2012-03-21 MED ORDER — METOPROLOL TARTRATE 50 MG PO TABS: 50.0000 mg | ORAL_TABLET | Freq: Two times a day (BID) | ORAL | Status: DC

## 2012-03-21 MED ORDER — INDOMETHACIN 25 MG PO CAPS: 25.0000 mg | ORAL_CAPSULE | Freq: Three times a day (TID) | ORAL | Status: DC

## 2012-03-21 MED ORDER — CLOPIDOGREL BISULFATE 75 MG PO TABS: 75.0000 mg | ORAL_TABLET | Freq: Every day | ORAL | Status: DC

## 2012-03-21 MED ORDER — LOSARTAN POTASSIUM 50 MG PO TABS: 50.0000 mg | ORAL_TABLET | Freq: Every day | ORAL | Status: DC

## 2012-03-21 MED ORDER — SIMVASTATIN 40 MG PO TABS: 40.0000 mg | ORAL_TABLET | Freq: Every evening | ORAL | Status: DC

## 2012-03-21 MED ORDER — ALLOPURINOL 300 MG PO TABS: 300.0000 mg | ORAL_TABLET | Freq: Every day | ORAL | Status: DC
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
## 2012-03-21 NOTE — Patient Instructions (Addendum)
Today we updated your med list in our EPIC system...    Continue your current medications the same...  We decided to add LOSARTAN 50mg  taken in the evening to help decrease the night time blood pressure readings...    Continue to monitor your BP at home 7 call for any problems...  Today we reviewed your previous labs etc...  We also gave you the combination Tetanus vaccine called the TDAP (it should be good for 84yrs)...  Call for any problems...  Let's plan a follow up visit in 6 months w/ FASTING blood work at that time.Marland KitchenMarland Kitchen

## 2012-03-22 ENCOUNTER — Encounter: Payer: Self-pay | Admitting: Pulmonary Disease

## 2012-05-29 ENCOUNTER — Telehealth: Payer: Self-pay | Admitting: Pulmonary Disease

## 2012-05-29 MED ORDER — METOPROLOL TARTRATE 50 MG PO TABS: 50.0000 mg | ORAL_TABLET | Freq: Two times a day (BID) | ORAL | Status: DC

## 2012-05-29 NOTE — Telephone Encounter (Signed)
Rx has been sent in. Pt is aware. 

## 2012-06-02 ENCOUNTER — Other Ambulatory Visit: Payer: Self-pay | Admitting: Pulmonary Disease

## 2012-09-19 ENCOUNTER — Ambulatory Visit (INDEPENDENT_AMBULATORY_CARE_PROVIDER_SITE_OTHER): Payer: Medicare Other | Admitting: Pulmonary Disease

## 2012-09-19 ENCOUNTER — Encounter: Payer: Self-pay | Admitting: Pulmonary Disease

## 2012-09-19 ENCOUNTER — Other Ambulatory Visit (INDEPENDENT_AMBULATORY_CARE_PROVIDER_SITE_OTHER): Payer: Medicare Other

## 2012-09-19 ENCOUNTER — Ambulatory Visit (INDEPENDENT_AMBULATORY_CARE_PROVIDER_SITE_OTHER)
Admission: RE | Admit: 2012-09-19 | Discharge: 2012-09-19 | Disposition: A | Discharge status: Home / Self Care | Payer: Medicare Other | Source: Ambulatory Visit | Attending: Pulmonary Disease | Admitting: Pulmonary Disease

## 2012-09-19 VITALS — BP 132/64 | HR 60 | Temp 97.8°F | Ht 70.0 in | Wt 172.6 lb

## 2012-09-19 DIAGNOSIS — I714 Abdominal aortic aneurysm, without rupture: Secondary | ICD-10-CM

## 2012-09-19 DIAGNOSIS — J449 Chronic obstructive pulmonary disease, unspecified: Secondary | ICD-10-CM

## 2012-09-19 DIAGNOSIS — I2510 Atherosclerotic heart disease of native coronary artery without angina pectoris: Secondary | ICD-10-CM

## 2012-09-19 DIAGNOSIS — E785 Hyperlipidemia, unspecified: Secondary | ICD-10-CM

## 2012-09-19 DIAGNOSIS — F419 Anxiety disorder, unspecified: Secondary | ICD-10-CM

## 2012-09-19 DIAGNOSIS — M109 Gout, unspecified: Secondary | ICD-10-CM

## 2012-09-19 DIAGNOSIS — I10 Essential (primary) hypertension: Secondary | ICD-10-CM

## 2012-09-19 DIAGNOSIS — K5730 Diverticulosis of large intestine without perforation or abscess without bleeding: Secondary | ICD-10-CM

## 2012-09-19 DIAGNOSIS — N320 Bladder-neck obstruction: Secondary | ICD-10-CM

## 2012-09-19 DIAGNOSIS — F411 Generalized anxiety disorder: Secondary | ICD-10-CM

## 2012-09-19 DIAGNOSIS — T148XXA Other injury of unspecified body region, initial encounter: Secondary | ICD-10-CM

## 2012-09-19 DIAGNOSIS — R0989 Other specified symptoms and signs involving the circulatory and respiratory systems: Secondary | ICD-10-CM

## 2012-09-19 DIAGNOSIS — R05 Cough: Secondary | ICD-10-CM

## 2012-09-19 DIAGNOSIS — I739 Peripheral vascular disease, unspecified: Secondary | ICD-10-CM

## 2012-09-19 LAB — BASIC METABOLIC PANEL
Calcium: 9.4 mg/dL (ref 8.4–10.5)
Creatinine, Ser: 1 mg/dL (ref 0.4–1.5)
GFR: 79.14 mL/min (ref >60.00)
Glucose, Bld: 97 mg/dL (ref 70–99)
Sodium: 139 mEq/L (ref 135–145)

## 2012-09-19 LAB — PSA: PSA: 0.85 ng/mL (ref 0.10–4.00)

## 2012-09-19 LAB — LIPID PANEL
HDL: 38.9 mg/dL — ABNORMAL LOW (ref >39.00)
Total CHOL/HDL Ratio: 3
Triglycerides: 121 mg/dL (ref 0.0–149.0)
VLDL: 24.2 mg/dL (ref 0.0–40.0)

## 2012-09-19 LAB — CBC WITH DIFFERENTIAL/PLATELET
Basophils Absolute: 0 10*3/uL (ref 0.0–0.1)
Basophils Relative: 0.5 % (ref 0.0–3.0)
Eosinophils Relative: 5.8 % — ABNORMAL HIGH (ref 0.0–5.0)
HCT: 46 % (ref 39.0–52.0)
Hemoglobin: 15.8 g/dL (ref 13.0–17.0)
Lymphocytes Relative: 18.3 % (ref 12.0–46.0)
Lymphs Abs: 1.7 10*3/uL (ref 0.7–4.0)
Monocytes Relative: 7.1 % (ref 3.0–12.0)
Neutro Abs: 6.4 10*3/uL (ref 1.4–7.7)
RBC: 4.88 Mil/uL (ref 4.22–5.81)
RDW: 13.4 % (ref 11.5–14.6)

## 2012-09-19 LAB — HEPATIC FUNCTION PANEL
Albumin: 4.4 g/dL (ref 3.5–5.2)
Alkaline Phosphatase: 67 U/L (ref 39–117)
Total Protein: 7.6 g/dL (ref 6.0–8.3)

## 2012-09-19 MED ORDER — PANTOPRAZOLE SODIUM 40 MG PO TBEC: 40.0000 mg | DELAYED_RELEASE_TABLET | Freq: Every day | ORAL | Status: DC

## 2012-09-19 NOTE — Patient Instructions (Addendum)
Today we updated your med list in our EPIC system...    Continue your current medications the same...  Today we did your follow up studies> CXR, EKG, PFT, FASTING blood work...    We will contact you w/ the results when available...     Once we get to review your results we can decide if any additiopnal medication is warranted...  In the meanwhile> you can take the following OTC meds to help you cough:    MUCINEX 600mg  - take 2 tabs twice daily w/ plenty of fluids...    OTC DELSYM cough syrup - take 2 tsp every 12h as needed...  I believe that REFLUX has a lot to do w/ your coughing (this is a common association but not readily apprec by patients)...    We are going to start an acid suppressor- PROTONIX (Pantoprazole) 40mg  taken 30 min before the eve meal...    Endeavor to NOT eat or drink AFTER dinner in the eve...    Elevate the head of your bed on 6" blocks (I know this is a hassle but very important- do it!)  Call for any questions...  Let's plan a follow up visit in 53mo, sooner if needed for problems.Marland KitchenMarland Kitchen
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
## 2012-09-23 ENCOUNTER — Other Ambulatory Visit: Payer: Self-pay | Admitting: Pulmonary Disease

## 2012-09-23 MED ORDER — MOMETASONE FURO-FORMOTEROL FUM 200-5 MCG/ACT IN AERO: 2.0000 | INHALATION_SPRAY | Freq: Two times a day (BID) | RESPIRATORY_TRACT | Status: DC

## 2012-09-23 NOTE — Telephone Encounter (Signed)
Pt started on dulera 200/5.  This has been sent to the pharmacy and pt is aware.

## 2012-11-27 ENCOUNTER — Other Ambulatory Visit: Payer: Self-pay

## 2012-12-01 ENCOUNTER — Telehealth: Payer: Self-pay | Admitting: Pulmonary Disease

## 2012-12-01 DIAGNOSIS — R0989 Other specified symptoms and signs involving the circulatory and respiratory systems: Secondary | ICD-10-CM

## 2012-12-01 NOTE — Telephone Encounter (Signed)
lmomtcb x1 for Ione. Is this FYI? Or this something needed?

## 2012-12-01 NOTE — Telephone Encounter (Signed)
I spoke with Juliette Alcide. She reports pt is coming to see NP on friday for yearly f/u on AAA. She reports at that time pt wants to have carotid doppler done at that time as well. Juliette Alcide needs an order placed for this. She reports they do not follow pt for this. Only for the AAA. Please advise SN thanks

## 2012-12-01 NOTE — Telephone Encounter (Signed)
Per SN---  Ok for the carotid doppler

## 2012-12-01 NOTE — Telephone Encounter (Signed)
Order placed. Jennifer Castillo, CMA  

## 2012-12-04 ENCOUNTER — Encounter: Payer: Self-pay | Admitting: Family

## 2012-12-04 ENCOUNTER — Ambulatory Visit: Payer: Medicare Other | Admitting: Family

## 2012-12-04 ENCOUNTER — Other Ambulatory Visit (HOSPITAL_COMMUNITY): Payer: Medicare Other

## 2012-12-05 ENCOUNTER — Other Ambulatory Visit (HOSPITAL_COMMUNITY): Payer: Medicare Other

## 2012-12-05 ENCOUNTER — Ambulatory Visit (INDEPENDENT_AMBULATORY_CARE_PROVIDER_SITE_OTHER): Payer: Medicare Other | Admitting: Family

## 2012-12-05 ENCOUNTER — Ambulatory Visit (INDEPENDENT_AMBULATORY_CARE_PROVIDER_SITE_OTHER)
Admission: RE | Admit: 2012-12-05 | Discharge: 2012-12-05 | Disposition: A | Discharge status: Home / Self Care | Payer: Medicare Other | Source: Ambulatory Visit | Attending: Vascular Surgery | Admitting: Vascular Surgery

## 2012-12-05 ENCOUNTER — Ambulatory Visit (HOSPITAL_COMMUNITY)
Admission: RE | Admit: 2012-12-05 | Discharge: 2012-12-05 | Disposition: A | Discharge status: Home / Self Care | Payer: Medicare Other | Source: Ambulatory Visit | Attending: Family | Admitting: Family

## 2012-12-05 DIAGNOSIS — R0989 Other specified symptoms and signs involving the circulatory and respiratory systems: Secondary | ICD-10-CM

## 2012-12-05 DIAGNOSIS — I714 Abdominal aortic aneurysm, without rupture, unspecified: Secondary | ICD-10-CM | POA: Insufficient documentation

## 2012-12-05 NOTE — Patient Instructions (Signed)
Abdominal Aortic Aneurysm An aneurysm is a weakened or damaged part of an artery wall that bulges from the normal force of blood pumping through the body. An abdominal aortic aneurysm is an aneurysm that occurs in the lower part of the aorta, the main artery of the body.  The major concern with an abdominal aortic aneurysm is that it can enlarge and burst (rupture) or blood can flow between the layers of the wall of the aorta through a tear (aorticdissection). Both of these conditions can cause bleeding inside the body and can be life threatening unless diagnosed and treated promptly. CAUSES  The exact cause of an abdominal aortic aneurysm is unknown. Some contributing factors are:   A hardening of the arteries caused by the buildup of fat and other substances in the lining of a blood vessel (arteriosclerosis).  Inflammation of the walls of an artery (arteritis).   Connective tissue diseases, such as Marfan syndrome.   Abdominal trauma.   An infection, such as syphilis or staphylococcus, in the wall of the aorta (infectious aortitis) caused by bacteria. RISK FACTORS  Risk factors that contribute to an abdominal aortic aneurysm may include:  Age older than 60 years.   High blood pressure (hypertension).  Male gender.  Ethnicity (white race).  Obesity.  Family history of aneurysm (first degree relatives only).  Tobacco use. PREVENTION  The following healthy lifestyle habits may help decrease your risk of abdominal aortic aneurysm:  Quitting smoking. Smoking can raise your blood pressure and cause arteriosclerosis.  Limiting or avoiding alcohol.  Keeping your blood pressure, blood sugar level, and cholesterol levels within normal limits.  Decreasing your salt intake. In somepeople, too much salt can raise blood pressure and increase your risk of abdominal aortic aneurysm.  Eating a diet low in saturated fats and cholesterol.  Increasing your fiber intake by including  whole grains, vegetables, and fruits in your diet. Eating these foods may help lower blood pressure.  Maintaining a healthy weight.  Staying physically active and exercising regularly. SYMPTOMS  The symptoms of abdominal aortic aneurysm may vary depending on the size and rate of growth of the aneurysm.Most grow slowly and do not have any symptoms. When symptoms do occur, they may include:  Pain (abdomen, side, lower back, or groin). The pain may vary in intensity. A sudden onset of severe pain may indicate that the aneurysm has ruptured.  Feeling full after eating only small amounts of food.  Nausea or vomiting or both.  Feeling a pulsating lump in the abdomen.  Feeling faint or passing out. DIAGNOSIS  Since most unruptured abdominal aortic aneurysms have no symptoms, they are often discovered during diagnostic exams for other conditions. An aneurysm may be found during the following procedures:  Ultrasonography (A one-time screening for abdominal aortic aneurysm by ultrasonography is also recommended for all men aged 65-75 years who have ever smoked).  X-ray exams.  A computed tomography (CT).  Magnetic resonance imaging (MRI).  Angiography or arteriography. TREATMENT  Treatment of an abdominal aortic aneurysm depends on the size of your aneurysm, your age, and risk factors for rupture. Medication to control blood pressure and pain may be used to manage aneurysms smaller than 6 cm. Regular monitoring for enlargement may be recommended by your caregiver if:  The aneurysm is 3- 4 cm in size (an annual ultrasonography may be recommended).  The aneurysm is 4- 4.5 cm in size (an ultrasonography every 6 months may be recommended).  The aneurysm is larger than 4.5  cm in size (your caregiver may ask that you be examined by a vascular surgeon). If your aneurysm is larger than 5.5 cm, surgical repair may be recommended. There are two main methods for repair of an aneurysm:    Endovascular repair (a minimally invasive surgery). This is done most often.  Open repair. This method is used if an endovascular repair is not possible. Document Released: 10/18/2004 Document Revised: 05/05/2012 Document Reviewed: 02/08/2012 [facility] Patient Information 2014 [place], [place].

## 2012-12-05 NOTE — Progress Notes (Signed)
This encounter was created in error - please disregard.

## 2012-12-09 ENCOUNTER — Encounter: Payer: Self-pay | Admitting: Pulmonary Disease

## 2012-12-09 NOTE — Telephone Encounter (Signed)
Please advise SN thanks 

## 2012-12-10 ENCOUNTER — Other Ambulatory Visit: Payer: Self-pay | Admitting: Surgery

## 2012-12-10 DIAGNOSIS — I714 Abdominal aortic aneurysm, without rupture: Secondary | ICD-10-CM

## 2012-12-12 ENCOUNTER — Encounter: Payer: Self-pay | Admitting: Vascular Surgery

## 2012-12-22 ENCOUNTER — Encounter: Payer: Self-pay | Admitting: Pulmonary Disease

## 2012-12-22 ENCOUNTER — Other Ambulatory Visit: Payer: Self-pay | Admitting: Pulmonary Disease

## 2012-12-23 ENCOUNTER — Other Ambulatory Visit: Payer: Self-pay | Admitting: *Deleted

## 2012-12-23 MED ORDER — CLOPIDOGREL BISULFATE 75 MG PO TABS: 75.0000 mg | ORAL_TABLET | Freq: Every day | ORAL | Status: DC

## 2012-12-23 NOTE — Telephone Encounter (Signed)
Plavix rx was sent to [facility] via pt's email request.

## 2013-03-14 ENCOUNTER — Encounter: Payer: Self-pay | Admitting: Pulmonary Disease

## 2013-03-16 MED ORDER — METOPROLOL TARTRATE 50 MG PO TABS: ORAL_TABLET | ORAL | Status: DC

## 2013-03-24 ENCOUNTER — Encounter: Payer: Self-pay | Admitting: Pulmonary Disease

## 2013-03-24 ENCOUNTER — Ambulatory Visit (INDEPENDENT_AMBULATORY_CARE_PROVIDER_SITE_OTHER): Payer: Medicare HMO | Admitting: Pulmonary Disease

## 2013-03-24 VITALS — BP 132/70 | HR 66 | Temp 97.0°F | Ht 70.0 in | Wt 173.2 lb

## 2013-03-24 DIAGNOSIS — J449 Chronic obstructive pulmonary disease, unspecified: Secondary | ICD-10-CM

## 2013-03-24 DIAGNOSIS — T148XXA Other injury of unspecified body region, initial encounter: Secondary | ICD-10-CM

## 2013-03-24 DIAGNOSIS — J209 Acute bronchitis, unspecified: Secondary | ICD-10-CM

## 2013-03-24 DIAGNOSIS — I714 Abdominal aortic aneurysm, without rupture, unspecified: Secondary | ICD-10-CM

## 2013-03-24 DIAGNOSIS — I2510 Atherosclerotic heart disease of native coronary artery without angina pectoris: Secondary | ICD-10-CM

## 2013-03-24 DIAGNOSIS — I739 Peripheral vascular disease, unspecified: Secondary | ICD-10-CM

## 2013-03-24 DIAGNOSIS — E785 Hyperlipidemia, unspecified: Secondary | ICD-10-CM

## 2013-03-24 DIAGNOSIS — M109 Gout, unspecified: Secondary | ICD-10-CM

## 2013-03-24 DIAGNOSIS — I10 Essential (primary) hypertension: Secondary | ICD-10-CM

## 2013-03-24 DIAGNOSIS — K5730 Diverticulosis of large intestine without perforation or abscess without bleeding: Secondary | ICD-10-CM

## 2013-03-24 MED ORDER — METHYLPREDNISOLONE 4 MG PO KIT: PACK | ORAL | Status: DC

## 2013-03-24 MED ORDER — LOSARTAN POTASSIUM 50 MG PO TABS: 50.0000 mg | ORAL_TABLET | Freq: Every day | ORAL | Status: DC

## 2013-03-24 MED ORDER — ALLOPURINOL 300 MG PO TABS: 300.0000 mg | ORAL_TABLET | Freq: Every day | ORAL | Status: DC

## 2013-03-24 MED ORDER — PANTOPRAZOLE SODIUM 40 MG PO TBEC
40.0000 mg | DELAYED_RELEASE_TABLET | Freq: Every day | ORAL | 3 refills | Status: DC
  Filled 2023-09-30 – 2023-10-07 (×2): qty 90, 90d supply, fill #0

## 2013-03-24 MED ORDER — SIMVASTATIN 40 MG PO TABS: 40.0000 mg | ORAL_TABLET | Freq: Every evening | ORAL | Status: DC

## 2013-03-24 MED ORDER — CLOPIDOGREL BISULFATE 75 MG PO TABS: 75.0000 mg | ORAL_TABLET | Freq: Every day | ORAL | Status: DC
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
## 2013-03-24 NOTE — Addendum Note (Signed)
Addended by: Doroteo Glassman D on: 03/24/2013 09:58 AM   Modules accepted: Orders

## 2013-03-24 NOTE — Patient Instructions (Signed)
Today we updated your med list in our EPIC system...     We decided to change the [facility] inhaler to [id number]- one inhalation twice daily    Don't forget to rinse & Gargle after each treatment...  For your current asthmatic bronchitis>    Take the Medrol dosepak  Rx as directed...    Continue the MUCINEX 600mg - 2 tabd twice daily w/ fluids...    And you may use the OTC DELSYM-2 tsp twice daily for the cough...  Call for any questions...  Let's plan a follow up visit in 17mo, sooner if needed for problems.Marland KitchenMarland Kitchen

## 2013-11-06 ENCOUNTER — Other Ambulatory Visit: Payer: Self-pay

## 2013-12-07 ENCOUNTER — Encounter: Payer: Self-pay | Admitting: Internal Medicine

## 2013-12-07 DIAGNOSIS — K5792 Diverticulitis of intestine, part unspecified, without perforation or abscess without bleeding: Secondary | ICD-10-CM | POA: Insufficient documentation

## 2013-12-10 ENCOUNTER — Encounter: Payer: Self-pay | Admitting: Family

## 2013-12-11 ENCOUNTER — Ambulatory Visit (HOSPITAL_COMMUNITY)
Admission: RE | Admit: 2013-12-11 | Discharge: 2013-12-11 | Disposition: A | Discharge status: Home / Self Care | Payer: Medicare HMO | Source: Ambulatory Visit | Attending: Family | Admitting: Family

## 2013-12-11 ENCOUNTER — Ambulatory Visit (INDEPENDENT_AMBULATORY_CARE_PROVIDER_SITE_OTHER)
Admission: RE | Admit: 2013-12-11 | Discharge: 2013-12-11 | Disposition: A | Discharge status: Home / Self Care | Payer: Medicare HMO | Source: Ambulatory Visit | Attending: Surgery | Admitting: Surgery

## 2013-12-11 ENCOUNTER — Ambulatory Visit (INDEPENDENT_AMBULATORY_CARE_PROVIDER_SITE_OTHER): Payer: Commercial Managed Care - HMO | Admitting: Family

## 2013-12-11 ENCOUNTER — Encounter: Payer: Self-pay | Admitting: Family

## 2013-12-11 VITALS — BP 129/82 | HR 64 | Resp 16 | Ht 68.0 in | Wt 172.0 lb

## 2013-12-11 DIAGNOSIS — I6523 Occlusion and stenosis of bilateral carotid arteries: Secondary | ICD-10-CM | POA: Diagnosis not present

## 2013-12-11 DIAGNOSIS — I714 Abdominal aortic aneurysm, without rupture, unspecified: Secondary | ICD-10-CM

## 2013-12-11 DIAGNOSIS — I6529 Occlusion and stenosis of unspecified carotid artery: Secondary | ICD-10-CM | POA: Diagnosis present

## 2013-12-11 NOTE — Patient Instructions (Signed)
Abdominal Aortic Aneurysm An aneurysm is a weakened or damaged part of an artery wall that bulges from the normal force of blood pumping through the body. An abdominal aortic aneurysm is an aneurysm that occurs in the lower part of the aorta, the main artery of the body.  The major concern with an abdominal aortic aneurysm is that it can enlarge and burst (rupture) or blood can flow between the layers of the wall of the aorta through a tear (aorticdissection). Both of these conditions can cause bleeding inside the body and can be life threatening unless diagnosed and treated promptly. CAUSES  The exact cause of an abdominal aortic aneurysm is unknown. Some contributing factors are:   A hardening of the arteries caused by the buildup of fat and other substances in the lining of a blood vessel (arteriosclerosis).  Inflammation of the walls of an artery (arteritis).   Connective tissue diseases, such as Marfan syndrome.   Abdominal trauma.   An infection, such as syphilis or staphylococcus, in the wall of the aorta (infectious aortitis) caused by bacteria. RISK FACTORS  Risk factors that contribute to an abdominal aortic aneurysm may include:  Age older than 60 years.   High blood pressure (hypertension).  Male gender.  Ethnicity (white race).  Obesity.  Family history of aneurysm (first degree relatives only).  Tobacco use. PREVENTION  The following healthy lifestyle habits may help decrease your risk of abdominal aortic aneurysm:  Quitting smoking. Smoking can raise your blood pressure and cause arteriosclerosis.  Limiting or avoiding alcohol.  Keeping your blood pressure, blood sugar level, and cholesterol levels within normal limits.  Decreasing your salt intake. In somepeople, too much salt can raise blood pressure and increase your risk of abdominal aortic aneurysm.  Eating a diet low in saturated fats and cholesterol.  Increasing your fiber intake by including  whole grains, vegetables, and fruits in your diet. Eating these foods may help lower blood pressure.  Maintaining a healthy weight.  Staying physically active and exercising regularly. SYMPTOMS  The symptoms of abdominal aortic aneurysm may vary depending on the size and rate of growth of the aneurysm.Most grow slowly and do not have any symptoms. When symptoms do occur, they may include:  Pain (abdomen, side, lower back, or groin). The pain may vary in intensity. A sudden onset of severe pain may indicate that the aneurysm has ruptured.  Feeling full after eating only small amounts of food.  Nausea or vomiting or both.  Feeling a pulsating lump in the abdomen.  Feeling faint or passing out. DIAGNOSIS  Since most unruptured abdominal aortic aneurysms have no symptoms, they are often discovered during diagnostic exams for other conditions. An aneurysm may be found during the following procedures:  Ultrasonography (A one-time screening for abdominal aortic aneurysm by ultrasonography is also recommended for all men aged 65-75 years who have ever smoked).  X-ray exams.  A computed tomography (CT).  Magnetic resonance imaging (MRI).  Angiography or arteriography. TREATMENT  Treatment of an abdominal aortic aneurysm depends on the size of your aneurysm, your age, and risk factors for rupture. Medication to control blood pressure and pain may be used to manage aneurysms smaller than 6 cm. Regular monitoring for enlargement may be recommended by your caregiver if:  The aneurysm is 3-4 cm in size (an annual ultrasonography may be recommended).  The aneurysm is 4-4.5 cm in size (an ultrasonography every 6 months may be recommended).  The aneurysm is larger than 4.5 cm in   size (your caregiver may ask that you be examined by a vascular surgeon). If your aneurysm is larger than 6 cm, surgical repair may be recommended. There are two main methods for repair of an aneurysm:   Endovascular  repair (a minimally invasive surgery). This is done most often.  Open repair. This method is used if an endovascular repair is not possible. Document Released: 10/18/2004 Document Revised: 05/05/2012 Document Reviewed: 02/08/2012 Exit[employer] Patient Information 2015 [employer]. This information is not intended to replace advice given to you by your health care provider. Make sure you discuss any questions you have with your health care provider.  

## 2013-12-11 NOTE — Progress Notes (Signed)
[facility] [place] HISTORY AND PHYSICAL   MRN : [id number]  History of Present Illness:   Justin Lyons is a 78 y.o. male followed by Dr. Oneida Alar for known AAA. He returns today for follow up of this and also a carotid Duplex.  The pt denies any history of stroke or TIA, he denies back or abdominal pain, denies tingling, numbness, weakness, or pain in either hand or arm. He denies claudication symptoms with walking. Pt indicates that his PCP heard a carotid bruit which is why he was referred for carotid Duplex monitoring.  He states he is physically active.   Pt Diabetic: No Pt smoker: former smoker, quit in 1989  Pt meds include: Statin :Yes Betablocker: Yes ASA: Yes Other anticoagulants/antiplatelets: no  Current Outpatient Prescriptions  Medication Sig Dispense Refill  . allopurinol (ZYLOPRIM) 300 MG tablet Take 1 tablet (300 mg total) by mouth daily. 90 tablet 3  . aspirin 325 MG tablet Take 325 mg by mouth daily.      . Cholecalciferol (VITAMIN D) 1000 UNITS capsule Take 1,000 Units by mouth daily.      . clopidogrel (PLAVIX) 75 MG tablet Take 1 tablet (75 mg total) by mouth daily. 90 tablet 3  . indomethacin (INDOCIN) 25 MG capsule Take 1 capsule (25 mg total) by mouth 3 (three) times daily with meals. As needed for gout pain 90 capsule 5  . losartan (COZAAR) 50 MG tablet Take 1 tablet (50 mg total) by mouth daily. 90 tablet 3  . methylPREDNISolone (MEDROL DOSEPAK) 4 MG tablet follow package directions Please provide 6 day pack 21 tablet 0  . metoprolol (LOPRESSOR) 50 MG tablet Take 1 tablet by mouth  twice a day 180 tablet 3  . nitroGLYCERIN (NITROSTAT) 0.4 MG SL tablet Place 0.4 mg under the tongue every 5 (five) minutes as needed.      . pantoprazole (PROTONIX) 40 MG tablet Take 1 tablet (40 mg total) by mouth daily. 90 tablet 3  . simvastatin (ZOCOR) 40 MG tablet Take 1 tablet (40 mg total) by mouth every evening. 90 tablet 3   No current  facility-administered medications for this visit.    Past Medical History  Diagnosis Date  . COPD (chronic obstructive pulmonary disease)   . Hypertension   . Coronary artery disease   . Peripheral vascular disease   . Carotid bruit   . Hyperlipidemia   . Diverticulosis of colon   . Hemorrhoids, internal   . Erectile dysfunction   . History of gout   . Knee pain   . History of compression fracture of spine   . Myocardial infarction 2005  . Arthritis   . AAA (abdominal aortic aneurysm)     06/02/2003 old BPG    Social History History  Substance Use Topics  . Smoking status: Former Smoker -- 1.50 packs/day for 20 years    Types: Cigarettes    Quit date: 01/22/1982  . Smokeless tobacco: Never Used  . Alcohol Use: Yes     Comment: social use    Family History Family History  Problem Relation Age of Onset  . Heart disease Father     Heart Disease before age 23  . Heart attack Father   . Cancer Mother   . Heart disease Mother   . Hyperlipidemia Mother   . Hypertension Mother   . Heart attack Mother   . Cancer Brother     Surgical History No past surgical history on file.  Allergies  Allergen Reactions  . Doxycycline Rash    REACTION: rash  . Penicillins Rash    REACTION: rash  . Sulfonamide Derivatives Rash    REACTION: rash    Current Outpatient Prescriptions  Medication Sig Dispense Refill  . allopurinol (ZYLOPRIM) 300 MG tablet Take 1 tablet (300 mg total) by mouth daily. 90 tablet 3  . aspirin 325 MG tablet Take 325 mg by mouth daily.      . Cholecalciferol (VITAMIN D) 1000 UNITS capsule Take 1,000 Units by mouth daily.      . clopidogrel (PLAVIX) 75 MG tablet Take 1 tablet (75 mg total) by mouth daily. 90 tablet 3  . indomethacin (INDOCIN) 25 MG capsule Take 1 capsule (25 mg total) by mouth 3 (three) times daily with meals. As needed for gout pain 90 capsule 5  . losartan (COZAAR) 50 MG tablet Take 1 tablet (50 mg total) by mouth daily. 90 tablet 3   . methylPREDNISolone (MEDROL DOSEPAK) 4 MG tablet follow package directions Please provide 6 day pack 21 tablet 0  . metoprolol (LOPRESSOR) 50 MG tablet Take 1 tablet by mouth  twice a day 180 tablet 3  . nitroGLYCERIN (NITROSTAT) 0.4 MG SL tablet Place 0.4 mg under the tongue every 5 (five) minutes as needed.      . pantoprazole (PROTONIX) 40 MG tablet Take 1 tablet (40 mg total) by mouth daily. 90 tablet 3  . simvastatin (ZOCOR) 40 MG tablet Take 1 tablet (40 mg total) by mouth every evening. 90 tablet 3   No current facility-administered medications for this visit.     REVIEW OF SYSTEMS: See HPI for pertinent positives and negatives.  Physical Examination  Filed Vitals:   12/11/13 1052 12/11/13 1054  BP: 121/77 129/82  Pulse: 67 64  Resp:  16  Height:  5\' 8"  (1.727 m)  Weight:  172 lb (78.019 kg)  SpO2:  97%   Body mass index is 26.16 kg/(m^2).  General:  WDWN in NAD Gait: Normal HENT: WNL Eyes: Pupils equal and react to light Pulmonary: normal non-labored breathing , without Rales, rhonchi, or  wheezing Cardiac: RRR, no murmur detected  Abdomen: soft, NT, no masses palpated Skin: no rashes, ulcers noted;  no Gangrene , no cellulitis; no open wounds.   VASCULAR EXAM  Carotid Bruits Right Left   Negative Positive   Aorta is palpable Radial pulses are 2+ palpable and =.              VASCULAR EXAM: Extremities without ischemic changes  without Gangrene; without open wounds.                                                                                                          LE Pulses Right Left       FEMORAL  not palpable  2+ palpable        POPLITEAL  not palpable   not palpable       POSTERIOR TIBIAL  2+ palpable   2+ palpable  DORSALIS PEDIS      ANTERIOR TIBIAL 2+ palpable  2+ palpable      Musculoskeletal: no muscle wasting or atrophy; no peripheral edema  Neurologic: A&O X 3; Appropriate Affect ;  SENSATION: normal; MOTOR FUNCTION: 5/5  Symmetric, CN 2-12 intact Speech is fluent/normal    Non-Invasive Vascular Imaging (12/11/2013):   CEREBROVASCULAR DUPLEX EVALUATION    INDICATION: Carotid disease    PREVIOUS INTERVENTION(S):     DUPLEX EXAM:     RIGHT  LEFT  Peak Systolic Velocities (cm/s) End Diastolic Velocities (cm/s) Plaque LOCATION Peak Systolic Velocities (cm/s) End Diastolic Velocities (cm/s) Plaque  74 10  CCA PROXIMAL 72 13   65 11  CCA MID 81 15   60 12 HT CCA DISTAL 69 12   100 6 HT ECA 113 8 HT  60 15 HT ICA PROXIMAL 53 15 HT  128 28  ICA MID 98 27   80 20  ICA DISTAL 78 19     2.1 ICA / CCA Ratio (PSV) 1.4  Antegrade Vertebral Flow Bidirectional  470 Brachial Systolic Pressure (mmHg) 962  Multiphasic (subclavian artery) Brachial Artery Waveforms Multiphasic (subclavian artery)    Plaque Morphology:  HM = Homogeneous, HT = Heterogeneous, CP = Calcific Plaque, SP = Smooth Plaque, IP = Irregular Plaque     ADDITIONAL FINDINGS: . No significant stenosis of the bilateral external or common carotid arteries. . Velocity of 398cm/s noted in the left proximal subclavian artery. . Mostly retrograde, bidirectional flow noted in the left vertebral artery. . Greater than 22mm/Mg difference in the bilateral brachial pressures.    IMPRESSION: 1. Doppler velocities suggest less than 40% stenoses of the bilateral proximal internal carotid arteries. 2. Abnormal findings of the left vertebral artery and bilateral brachial pressures, as described above.     Compared to the previous exam:  No significant change noted when compared to the previous exam on 12/05/12.     ABDOMINAL AORTA DUPLEX EVALUATION    INDICATION: Abdominal aortic aneurysm    PREVIOUS INTERVENTION(S):     DUPLEX EXAM:     LOCATION DIAMETER AP (cm) DIAMETER TRANSVERSE (cm) VELOCITIES (cm/sec)  Aorta Proximal 2.8 2.8 43  Aorta Mid 2.9 2.9 40  Aorta Distal 4.0 4.4 22  Right Common Iliac Artery 1.2 1.4 65  Left Common Iliac Artery 1.1  1.4 165    Previous max aortic diameter:  4.02cm Date: 12/05/12     ADDITIONAL FINDINGS: Decreased visualization of the abdominal vasculature due to overlying bowel gas.    IMPRESSION: 1. Aneurysmal dilatation of the distal abdominal aorta with a maximum diameter of 4.4cm. 2. No hemodynamically significant stenosis of the abdominal aorta and bilateral proximal common iliac arteries.    Compared to the previous exam:  No significant change in the abdominal aortic aneurysm when compared to the previous exam on 12/05/12.      ASSESSMENT:  Justin Lyons is a 78 y.o. male who has a history of a stable asymptomatic AAA and asymptomatic carotid artery stenoses and left sublcalvian artery stenosis. He has no steal symptoms in either upper extremity.  4.1 cm was the largest AAA measurement in 2011, largest measurement today is 4.4 cm, stable AAA over the last 4 years. Today's carotid artery Duplex reveals  less than 40% stenoses of the bilateral proximal internal carotid arteries, velocity of 398cm/s noted in the left proximal subclavian artery, and mostly retrograde, bidirectional flow noted in the left vertebral artery.   PLAN:   Based  on today's exam and non-invasive vascular lab results, the patient will follow up in 1 year with the following tests: carotid Duplex and AAA Duplex. I discussed in depth with the patient the nature of atherosclerosis, and emphasized the importance of maximal medical management including strict control of blood pressure, blood glucose, and lipid levels, obtaining regular exercise, and cessation of smoking.  The patient is aware that without maximal medical management the underlying atherosclerotic disease process will progress, limiting the benefit of any interventions.  Consideration for repair of AAA would be made when the size is 5.5 cm, growth > 1 cm/yr, and symptomatic status.  The patient was given information about stroke prevention and what symptoms  should prompt the patient to seek immediate medical care. The patient was given information about AAA including signs, symptoms, treatment,  what symptoms should prompt the patient to seek immediate medical care, and how to minimize the risk of enlargement and rupture of aneurysms.  Thank you for allowing [place] to participate in this patient's care.  Clemon Chambers, RN, MSN, FNP-C [facility] Office: [phone]  Clinic MD: Bridgett Larsson 12/11/2013 10:45 AM

## 2013-12-14 ENCOUNTER — Other Ambulatory Visit (HOSPITAL_COMMUNITY): Payer: Medicare Other

## 2013-12-14 ENCOUNTER — Ambulatory Visit: Payer: Medicare Other | Admitting: Family

## 2013-12-15 ENCOUNTER — Encounter: Payer: Self-pay | Admitting: Family

## 2013-12-29 ENCOUNTER — Encounter: Payer: Self-pay | Admitting: Gastroenterology

## 2013-12-29 ENCOUNTER — Ambulatory Visit (INDEPENDENT_AMBULATORY_CARE_PROVIDER_SITE_OTHER): Payer: Commercial Managed Care - HMO | Admitting: Gastroenterology

## 2013-12-29 ENCOUNTER — Telehealth: Payer: Self-pay | Admitting: *Deleted

## 2013-12-29 VITALS — BP 128/64 | HR 62 | Ht 68.0 in | Wt 173.0 lb

## 2013-12-29 DIAGNOSIS — Z800 Family history of malignant neoplasm of digestive organs: Secondary | ICD-10-CM | POA: Insufficient documentation

## 2013-12-29 MED ORDER — MOVIPREP 100 G PO SOLR: 1.0000 | Freq: Once | ORAL | Status: DC

## 2013-12-29 NOTE — Progress Notes (Signed)
12/29/2013 Justin Lyons [id number] 1935/08/14   HISTORY OF PRESENT ILLNESS:  This is a pleasant 78 year old male how is previously known to Dr. Olevia Perches for colonoscopies.  Last colonoscopy was in 01/2006 at which time he was found to have only minimal diverticulosis and internal hemorrhoids.  He has family history of colon cancer in his mother so it was recommended that he have another colonoscopy in 5 years from that time.  He is here today to schedule his surveillance colonoscopy.  He denies any GI complaints.  He is on Plavix for CAD and PVD, which he has been on for several years; prescribed currently by Dr. Lenna Gilford.   Past Medical History  Diagnosis Date  . COPD (chronic obstructive pulmonary disease)   . Hypertension   . Coronary artery disease   . Peripheral vascular disease   . Carotid bruit   . Hyperlipidemia   . Diverticulosis of colon   . Hemorrhoids, internal   . Erectile dysfunction   . History of gout   . Knee pain   . History of compression fracture of spine   . Myocardial infarction 2005  . Arthritis   . AAA (abdominal aortic aneurysm)     06/02/2003 old BPG  . Carotid artery occlusion    History reviewed. No pertinent past surgical history.  reports that he quit smoking about 31 years ago. His smoking use included Cigarettes. He has a 30 pack-year smoking history. He has never used smokeless tobacco. He reports that he drinks alcohol. He reports that he does not use illicit drugs. family history includes Cancer in his brother and mother; Heart attack in his father and mother; Heart disease in his father and mother; Hyperlipidemia in his mother; Hypertension in his mother. Allergies  Allergen Reactions  . Doxycycline Rash    REACTION: rash  . Penicillins Rash    REACTION: rash  . Sulfonamide Derivatives Rash    REACTION: rash      Outpatient Encounter Prescriptions as of 12/29/2013  Medication Sig  . allopurinol (ZYLOPRIM) 300 MG tablet Take 1 tablet (300  mg total) by mouth daily.  [name] aspirin 325 MG tablet Take 325 mg by mouth daily.    . Cholecalciferol (VITAMIN D) 1000 UNITS capsule Take 1,000 Units by mouth daily.    . clopidogrel (PLAVIX) 75 MG tablet Take 1 tablet (75 mg total) by mouth daily.  . indomethacin (INDOCIN) 25 MG capsule Take 1 capsule (25 mg total) by mouth 3 (three) times daily with meals. As needed for gout pain  . metoprolol (LOPRESSOR) 50 MG tablet Take 1 tablet by mouth  twice a day  . nitroGLYCERIN (NITROSTAT) 0.4 MG SL tablet Place 0.4 mg under the tongue every 5 (five) minutes as needed.    . pantoprazole (PROTONIX) 40 MG tablet Take 1 tablet (40 mg total) by mouth daily.  . simvastatin (ZOCOR) 40 MG tablet Take 1 tablet (40 mg total) by mouth every evening.  . [DISCONTINUED] losartan (COZAAR) 50 MG tablet Take 1 tablet (50 mg total) by mouth daily. (Patient not taking: Reported on 12/29/2013)  . [DISCONTINUED] methylPREDNISolone (MEDROL DOSEPAK) 4 MG tablet follow package directions Please provide 6 day pack (Patient not taking: Reported on 12/29/2013)     REVIEW OF SYSTEMS  : All other systems reviewed and negative except where noted in the History of Present Illness.   PHYSICAL EXAM: BP 128/64 mmHg  Pulse 62  Ht 5\' 8"  (1.727 m)  Wt 173  lb (78.472 kg)  BMI 26.31 kg/m2 General: Well developed white male in no acute distress Head: Normocephalic and atraumatic Eyes:  Sclerae anicteric, conjunctiva pink. Ears: Normal auditory acuity Lungs: Clear throughout to auscultation Heart: Regular rate and rhythm Abdomen: Soft, non-distended.  Normal bowel sounds.  Non-tender. Rectal:  Will be done at the time of colonoscopy. Musculoskeletal: Symmetrical with no gross deformities  Skin: No lesions on visible extremities Extremities: No edema  Neurological: Alert oriented x 4, grossly non-focal Psychological:  Alert and cooperative. Normal mood and affect  ASSESSMENT AND PLAN: -Family history of colon cancer:  Last  colonoscopy 01/2006.  Will schedule colonoscopy with Dr. Olevia Perches.  The risks, benefits, and alternatives were discussed with the patient and he consents to proceed.  The risks benefits and alternatives to a temporary hold of anti-coagulants/anti-platelets for the procedure were discussed with the patient he consents to proceed. Obtain clearance from Dr. Lenna Gilford for ok to hold Plavix prior to procedure.

## 2013-12-29 NOTE — Telephone Encounter (Signed)
12/29/2013   RE: Justin Lyons DOB: 09-27-1935 MRN: [id number]   Dear Dr. Lenna Gilford,    We have scheduled the above patient for an Colonoscopy. Our records show that he is on anticoagulation therapy.   Please advise as to how long the patient may come off his therapy of Plavix prior to the procedure, which is scheduled for 02-05-2014.  Please route the completed form to Evette Georges., CMA.   Sincerely,    Hope Pigeon

## 2013-12-29 NOTE — Patient Instructions (Signed)
You have been scheduled for a colonoscopy. Please follow written instructions given to you at your visit today.  Please pick up your prep kit at the pharmacy within the next 1-3 days. If you use inhalers (even only as needed), please bring them with you on the day of your procedure. Your physician has requested that you go to www.startemmi.com and enter the access code given to you at your visit today.( SENT TO YOUR E-MAIL) This web site gives a general overview about your procedure. However, you should still follow specific instructions given to you by our office regarding your preparation for the procedure.  

## 2013-12-29 NOTE — Progress Notes (Signed)
Reviewed and agree.

## 2014-01-03 DIAGNOSIS — Z0000 Encounter for general adult medical examination without abnormal findings: Secondary | ICD-10-CM | POA: Insufficient documentation

## 2014-01-04 NOTE — Telephone Encounter (Signed)
Per Dr. Lenna Gilford--  Pt is to hold ASA and Plavix 5 days prior to procedure and to resume therapy the day after procedure. If there are any questions or concerns please call at [phone]. Thanks.

## 2014-01-04 NOTE — Telephone Encounter (Signed)
LMOM for patient to call back.

## 2014-01-05 NOTE — Telephone Encounter (Signed)
Patient called back. I advised patient per Dr. Lenna Gilford he can hold Plavix 5 days prior to procedure. I advised patient if he receives a letter to contact the office to ignore it. Patient verbalized understanding.

## 2014-01-05 NOTE — Telephone Encounter (Signed)
Called patients PCP office [facility](they referred patient) at [phone] to see if they have any other number to reach patient. Per Erasmo Downer at [facility] they only have the same number we have. Will continue to call but will send a letter today so patient will know we need him to call the office.

## 2014-01-05 NOTE — Telephone Encounter (Signed)
lmom for patient to call back 

## 2014-01-29 ENCOUNTER — Encounter: Payer: Self-pay | Admitting: Internal Medicine

## 2014-02-05 ENCOUNTER — Encounter: Payer: Self-pay | Admitting: Internal Medicine

## 2014-02-05 ENCOUNTER — Ambulatory Visit (AMBULATORY_SURGERY_CENTER): Payer: Medicare HMO | Admitting: Internal Medicine

## 2014-02-05 VITALS — BP 119/69 | HR 62 | Temp 95.7°F | Resp 15 | Ht 68.0 in | Wt 173.0 lb

## 2014-02-05 DIAGNOSIS — Z800 Family history of malignant neoplasm of digestive organs: Secondary | ICD-10-CM

## 2014-02-05 DIAGNOSIS — Z1211 Encounter for screening for malignant neoplasm of colon: Secondary | ICD-10-CM

## 2014-02-05 MED ORDER — SODIUM CHLORIDE 0.9 % IV SOLN: 500.0000 mL | INTRAVENOUS | Status: DC

## 2014-02-05 NOTE — Patient Instructions (Signed)
YOU HAD AN ENDOSCOPIC PROCEDURE TODAY AT THE [facility]: Refer to the procedure report that was given to you for any specific questions about what was found during the examination.  If the procedure report does not answer your questions, please call your gastroenterologist to clarify.  If you requested that your care partner not be given the details of your procedure findings, then the procedure report has been included in a sealed envelope for you to review at your convenience later.  YOU SHOULD EXPECT: Some feelings of bloating in the abdomen. Passage of more gas than usual.  Walking can help get rid of the air that was put into your GI tract during the procedure and reduce the bloating. If you had a lower endoscopy (such as a colonoscopy or flexible sigmoidoscopy) you may notice spotting of blood in your stool or on the toilet paper. If you underwent a bowel prep for your procedure, then you may not have a normal bowel movement for a few days.  DIET: Your first meal following the procedure should be a light meal and then it is ok to progress to your normal diet.  A half-sandwich or bowl of soup is an example of a good first meal.  Heavy or fried foods are harder to digest and may make you feel nauseous or bloated.  Likewise meals heavy in dairy and vegetables can cause extra gas to form and this can also increase the bloating.  Drink plenty of fluids but you should avoid alcoholic beverages for 24 hours.  ACTIVITY: Your care partner should take you home directly after the procedure.  You should plan to take it easy, moving slowly for the rest of the day.  You can resume normal activity the day after the procedure however you should NOT DRIVE or use heavy machinery for 24 hours (because of the sedation medicines used during the test).    SYMPTOMS TO REPORT IMMEDIATELY: A gastroenterologist can be reached at any hour.  During normal business hours, 8:30 AM to 5:00 PM Monday through Friday,  call [phone].  After hours and on weekends, please call the GI answering service at [phone] who will take a message and have the physician on call contact you.   Following lower endoscopy (colonoscopy or flexible sigmoidoscopy):  Excessive amounts of blood in the stool  Significant tenderness or worsening of abdominal pains  Swelling of the abdomen that is new, acute  Fever of 100F or higher  FOLLOW UP: If any biopsies were taken you will be contacted by phone or by letter within the next 1-3 weeks.  Call your gastroenterologist if you have not heard about the biopsies in 3 weeks.  Our staff will call the home number listed on your records the next business day following your procedure to check on you and address any questions or concerns that you may have at that time regarding the information given to you following your procedure. This is a courtesy call and so if there is no answer at the home number and we have not heard from you through the emergency physician on call, we will assume that you have returned to your regular daily activities without incident.  SIGNATURES/CONFIDENTIALITY: You and/or your care partner have signed paperwork which will be entered into your electronic medical record.  These signatures attest to the fact that that the information above on your After Visit Summary has been reviewed and is understood.  Full responsibility of the confidentiality of this   discharge information lies with you and/or your care-partner.  Recommendations Diverticulosis and high fiber diet instructions provided to patient/care partner. Benefiber 1 teaspoon daily. Resume Plavix today.

## 2014-02-05 NOTE — Op Note (Addendum)
[place]  [address] [place], [address]   COLONOSCOPY PROCEDURE REPORT  PATIENT: Justin Lyons, Justin Lyons  MR#: [id number] BIRTHDATE: 02/05/1935 , 32  yrs. old GENDER: male ENDOSCOPIST: Lafayette Dragon, MD REFERRED GY:JEHUD Howard, M.D. PROCEDURE DATE:  02/05/2014 PROCEDURE:   Colonoscopy, screening First Screening Colonoscopy - Avg.  risk and is 50 yrs.  old or older - No.  Prior Negative Screening - Now for repeat screening. Less than 10 yrs Prior Negative Screening - Now for repeat screening.  Above average risk  History of Adenoma - Now for follow-up colonoscopy & has been > or = to 3 yrs.  N/A  Polyps Removed Today? No.  Polyps Removed Today? No.  Recommend repeat exam, <10 yrs? Polyps Removed Today? No.  Recommend repeat exam, <10 yrs? No. ASA CLASS:   Class II INDICATIONS:positive family history of colon cancer in patient's mother.  Last exam January 2008, Plavix has been on hold for 5 days. MEDICATIONS: Monitored anesthesia care and Propofol 200 mg IV  DESCRIPTION OF PROCEDURE:   After the risks benefits and alternatives of the procedure were thoroughly explained, informed consent was obtained.  The digital rectal exam revealed no abnormalities of the rectum.   The [id number]  endoscope was introduced through the anus and advanced to the cecum, which was identified by both the appendix and ileocecal valve. No adverse events experienced.   The quality of the prep was good, using MoviPrep  The instrument was then slowly withdrawn as the colon was fully examined.      COLON FINDINGS: There was severe diverticulosis noted throughout the entire examined colon with associated muscular hypertrophy, luminal narrowing, angulation and tortuosity.  Retroflexed views revealed no abnormalities. The time to cecum=8 minutes 59 seconds. Withdrawal time=6 minutes 00 seconds.  The scope was withdrawn and the procedure completed. COMPLICATIONS: There were no  immediate complications.  ENDOSCOPIC IMPRESSION: There was severe diverticulosis noted throughout the entire examined colon  RECOMMENDATIONS: High-fiber diet Benefiber 1 teaspoon daily No recall due to age Resume Plavix today   eSigned:  Lafayette Dragon, MD 02/05/2014 10:00 AM Revised: 02/05/2014 10:00 AM  cc:   PATIENT NAME:  Justin Lyons, Justin Lyons MR#: [id number]

## 2014-02-05 NOTE — Progress Notes (Signed)
A/ox3, pleased with MAC, report to RN 

## 2014-02-08 ENCOUNTER — Telehealth: Payer: Self-pay

## 2014-02-08 NOTE — Telephone Encounter (Signed)
  Follow up Call-  Call back number 02/05/2014  Post procedure Call Back phone  # [phone]  Permission to leave phone message Yes     Patient questions:  Do you have a fever, pain , or abdominal swelling? No. Pain Score  0 *  Have you tolerated food without any problems? Yes.    Have you been able to return to your normal activities? Yes.    Do you have any questions about your discharge instructions: Diet   No. Medications  No. Follow up visit  No.  Do you have questions or concerns about your Care? No.  Actions: * If pain score is 4 or above: No action needed, pain <4.

## 2014-05-20 DIAGNOSIS — S80869A Insect bite (nonvenomous), unspecified lower leg, initial encounter: Secondary | ICD-10-CM | POA: Insufficient documentation

## 2014-07-19 ENCOUNTER — Other Ambulatory Visit: Payer: Self-pay

## 2014-08-08 DIAGNOSIS — R197 Diarrhea, unspecified: Secondary | ICD-10-CM | POA: Insufficient documentation

## 2014-08-08 DIAGNOSIS — R509 Fever, unspecified: Secondary | ICD-10-CM | POA: Insufficient documentation

## 2014-12-21 ENCOUNTER — Encounter: Payer: Self-pay | Admitting: Family

## 2014-12-24 ENCOUNTER — Ambulatory Visit (INDEPENDENT_AMBULATORY_CARE_PROVIDER_SITE_OTHER): Payer: Commercial Managed Care - HMO | Admitting: Family

## 2014-12-24 ENCOUNTER — Encounter: Payer: Self-pay | Admitting: Family

## 2014-12-24 ENCOUNTER — Ambulatory Visit (HOSPITAL_COMMUNITY)
Admission: RE | Admit: 2014-12-24 | Discharge: 2014-12-24 | Disposition: A | Discharge status: Home / Self Care | Payer: Commercial Managed Care - HMO | Source: Ambulatory Visit | Attending: Family | Admitting: Family

## 2014-12-24 ENCOUNTER — Ambulatory Visit (INDEPENDENT_AMBULATORY_CARE_PROVIDER_SITE_OTHER)
Admission: RE | Admit: 2014-12-24 | Discharge: 2014-12-24 | Disposition: A | Discharge status: Home / Self Care | Payer: Commercial Managed Care - HMO | Source: Ambulatory Visit | Attending: Family | Admitting: Family

## 2014-12-24 VITALS — BP 125/78 | HR 60 | Temp 97.0°F | Resp 14 | Ht 70.0 in | Wt 168.0 lb

## 2014-12-24 DIAGNOSIS — I714 Abdominal aortic aneurysm, without rupture, unspecified: Secondary | ICD-10-CM

## 2014-12-24 DIAGNOSIS — I6523 Occlusion and stenosis of bilateral carotid arteries: Secondary | ICD-10-CM | POA: Diagnosis not present

## 2014-12-24 DIAGNOSIS — I708 Atherosclerosis of other arteries: Secondary | ICD-10-CM | POA: Diagnosis not present

## 2014-12-24 DIAGNOSIS — I771 Stricture of artery: Secondary | ICD-10-CM

## 2014-12-24 DIAGNOSIS — E785 Hyperlipidemia, unspecified: Secondary | ICD-10-CM | POA: Diagnosis not present

## 2014-12-24 DIAGNOSIS — R0989 Other specified symptoms and signs involving the circulatory and respiratory systems: Secondary | ICD-10-CM | POA: Diagnosis not present

## 2014-12-24 DIAGNOSIS — Z87891 Personal history of nicotine dependence: Secondary | ICD-10-CM

## 2014-12-24 DIAGNOSIS — I10 Essential (primary) hypertension: Secondary | ICD-10-CM | POA: Insufficient documentation

## 2014-12-24 NOTE — Patient Instructions (Signed)
Stroke Prevention Some medical conditions and behaviors are associated with an increased chance of having a stroke. You may prevent a stroke by making healthy choices and managing medical conditions. HOW CAN I REDUCE MY RISK OF HAVING A STROKE?   Stay physically active. Get at least 30 minutes of activity on most or all days.  Do not smoke. It may also be helpful to avoid exposure to secondhand smoke.  Limit alcohol use. Moderate alcohol use is considered to be:  No more than 2 drinks per day for men.  No more than 1 drink per day for nonpregnant women.  Eat healthy foods. This involves:  Eating 5 or more servings of fruits and vegetables a day.  Making dietary changes that address high blood pressure (hypertension), high cholesterol, diabetes, or obesity.  Manage your cholesterol levels.  Making food choices that are high in fiber and low in saturated fat, trans fat, and cholesterol may control cholesterol levels.  Take any prescribed medicines to control cholesterol as directed by your health care Aarti Mankowski.  Manage your diabetes.  Controlling your carbohydrate and sugar intake is recommended to manage diabetes.  Take any prescribed medicines to control diabetes as directed by your health care Bettyjo Lundblad.  Control your hypertension.  Making food choices that are low in salt (sodium), saturated fat, trans fat, and cholesterol is recommended to manage hypertension.  Ask your health care Niyonna Betsill if you need treatment to lower your blood pressure. Take any prescribed medicines to control hypertension as directed by your health care Lynette Topete.  If you are 18-39 years of age, have your blood pressure checked every 3-5 years. If you are 40 years of age or older, have your blood pressure checked every year.  Maintain a healthy weight.  Reducing calorie intake and making food choices that are low in sodium, saturated fat, trans fat, and cholesterol are recommended to manage  weight.  Stop drug abuse.  Avoid taking birth control pills.  Talk to your health care Jaliyah Fotheringham about the risks of taking birth control pills if you are over 35 years old, smoke, get migraines, or have ever had a blood clot.  Get evaluated for sleep disorders (sleep apnea).  Talk to your health care Ela Moffat about getting a sleep evaluation if you snore a lot or have excessive sleepiness.  Take medicines only as directed by your health care Estelita Iten.  For some people, aspirin or blood thinners (anticoagulants) are helpful in reducing the risk of forming abnormal blood clots that can lead to stroke. If you have the irregular heart rhythm of atrial fibrillation, you should be on a blood thinner unless there is a good reason you cannot take them.  Understand all your medicine instructions.  Make sure that other conditions (such as anemia or atherosclerosis) are addressed. SEEK IMMEDIATE MEDICAL CARE IF:   You have sudden weakness or numbness of the face, arm, or leg, especially on one side of the body.  Your face or eyelid droops to one side.  You have sudden confusion.  You have trouble speaking (aphasia) or understanding.  You have sudden trouble seeing in one or both eyes.  You have sudden trouble walking.  You have dizziness.  You have a loss of balance or coordination.  You have a sudden, severe headache with no known cause.  You have new chest pain or an irregular heartbeat. Any of these symptoms may represent a serious problem that is an emergency. Do not wait to see if the symptoms will   go away. Get medical help at once. Call your local emergency services (911 in [place] Do not drive yourself to the hospital.   This information is not intended to replace advice given to you by your health care Ipek Westra. Make sure you discuss any questions you have with your health care Petar Mucci.   Document Released: 02/16/2004 Document Revised: 01/29/2014 Document Reviewed:  07/11/2012 Elsevier Interactive Patient Education 2016 Else[employer]     Abdominal Aortic Aneurysm An aneurysm is a weakened or damaged part of an artery wall that bulges from the normal force of blood pumping through the body. An abdominal aortic aneurysm is an aneurysm that occurs in the lower part of the aorta, the main artery of the body.  The major concern with an abdominal aortic aneurysm is that it can enlarge and burst (rupture) or blood can flow between the layers of the wall of the aorta through a tear (aorticdissection). Both of these conditions can cause bleeding inside the body and can be life threatening unless diagnosed and treated promptly. CAUSES  The exact cause of an abdominal aortic aneurysm is unknown. Some contributing factors are:   A hardening of the arteries caused by the buildup of fat and other substances in the lining of a blood vessel (arteriosclerosis).  Inflammation of the walls of an artery (arteritis).   Connective tissue diseases, such as Marfan syndrome.   Abdominal trauma.   An infection, such as syphilis or staphylococcus, in the wall of the aorta (infectious aortitis) caused by bacteria. RISK FACTORS  Risk factors that contribute to an abdominal aortic aneurysm may include:  Age older than 60 years.   High blood pressure (hypertension).  Male gender.  Ethnicity (white race).  Obesity.  Family history of aneurysm (first degree relatives only).  Tobacco use. PREVENTION  The following healthy lifestyle habits may help decrease your risk of abdominal aortic aneurysm:  Quitting smoking. Smoking can raise your blood pressure and cause arteriosclerosis.  Limiting or avoiding alcohol.  Keeping your blood pressure, blood sugar level, and cholesterol levels within normal limits.  Decreasing your salt intake. In somepeople, too much salt can raise blood pressure and increase your risk of abdominal aortic aneurysm.  Eating a diet low in  saturated fats and cholesterol.  Increasing your fiber intake by including whole grains, vegetables, and fruits in your diet. Eating these foods may help lower blood pressure.  Maintaining a healthy weight.  Staying physically active and exercising regularly. SYMPTOMS  The symptoms of abdominal aortic aneurysm may vary depending on the size and rate of growth of the aneurysm.Most grow slowly and do not have any symptoms. When symptoms do occur, they may include:  Pain (abdomen, side, lower back, or groin). The pain may vary in intensity. A sudden onset of severe pain may indicate that the aneurysm has ruptured.  Feeling full after eating only small amounts of food.  Nausea or vomiting or both.  Feeling a pulsating lump in the abdomen.  Feeling faint or passing out. DIAGNOSIS  Since most unruptured abdominal aortic aneurysms have no symptoms, they are often discovered during diagnostic exams for other conditions. An aneurysm may be found during the following procedures:  Ultrasonography (A one-time screening for abdominal aortic aneurysm by ultrasonography is also recommended for all men aged 65-75 years who have ever smoked).  X-ray exams.  A computed tomography (CT).  Magnetic resonance imaging (MRI).  Angiography or arteriography. TREATMENT  Treatment of an abdominal aortic aneurysm depends on the size of   your aneurysm, your age, and risk factors for rupture. Medication to control blood pressure and pain may be used to manage aneurysms smaller than 6 cm. Regular monitoring for enlargement may be recommended by your caregiver if:  The aneurysm is 3-4 cm in size (an annual ultrasonography may be recommended).  The aneurysm is 4-4.5 cm in size (an ultrasonography every 6 months may be recommended).  The aneurysm is larger than 4.5 cm in size (your caregiver may ask that you be examined by a vascular surgeon). If your aneurysm is larger than 6 cm, surgical repair may be  recommended. There are two main methods for repair of an aneurysm:   Endovascular repair (a minimally invasive surgery). This is done most often.  Open repair. This method is used if an endovascular repair is not possible.   This information is not intended to replace advice given to you by your health care Ladelle Teodoro. Make sure you discuss any questions you have with your health care Zong Mcquarrie.   Document Released: 10/18/2004 Document Revised: 05/05/2012 Document Reviewed: 02/08/2012 Elsevier Interactive Patient Education 2016 Elsevier Inc.  

## 2014-12-24 NOTE — Addendum Note (Signed)
Addended by: Dorthula Rue L on: 12/24/2014 02:13 PM   Modules accepted: Orders

## 2014-12-24 NOTE — Progress Notes (Signed)
[facility] HISTORY AND PHYSICAL   MRN : [id number]  History of Present Illness:   Justin Lyons is a 79 y.o. male followed by Dr. Oneida Alar for known AAA. He returns today for follow up of this and also a carotid Duplex.  The pt denies any history of stroke or TIA, he denies back or abdominal pain, denies tingling, numbness, weakness, or pain in either hand or arm. He denies claudication symptoms with walking. Pt indicates that his PCP heard a carotid bruit which is why he was referred for carotid Duplex monitoring.  He states he is physically active.  Pt Diabetic: No Pt smoker: former smoker, quit in 1989  Pt meds include: Statin :Yes Betablocker: Yes ASA: Yes Other anticoagulants/antiplatelets: Plavix    Current Outpatient Prescriptions  Medication Sig Dispense Refill  . allopurinol (ZYLOPRIM) 300 MG tablet Take 1 tablet (300 mg total) by mouth daily. 90 tablet 3  . aspirin 325 MG tablet Take 325 mg by mouth daily.      . Cholecalciferol (VITAMIN D) 1000 UNITS capsule Take 1,000 Units by mouth daily.      . clopidogrel (PLAVIX) 75 MG tablet Take 1 tablet (75 mg total) by mouth daily. 90 tablet 3  . indomethacin (INDOCIN) 25 MG capsule Take 1 capsule (25 mg total) by mouth 3 (three) times daily with meals. As needed for gout pain 90 capsule 5  . metoprolol (LOPRESSOR) 50 MG tablet Take 1 tablet by mouth  twice a day 180 tablet 3  . nitroGLYCERIN (NITROSTAT) 0.4 MG SL tablet Place 0.4 mg under the tongue every 5 (five) minutes as needed.      . pantoprazole (PROTONIX) 40 MG tablet Take 1 tablet (40 mg total) by mouth daily. 90 tablet 3  . simvastatin (ZOCOR) 40 MG tablet Take 1 tablet (40 mg total) by mouth every evening. 90 tablet 3   No current facility-administered medications for this visit.    Past Medical History  Diagnosis Date  . COPD (chronic obstructive pulmonary disease)   . Hypertension   . Coronary artery disease   . Peripheral  vascular disease   . Carotid bruit   . Hyperlipidemia   . Diverticulosis of colon   . Hemorrhoids, internal   . Erectile dysfunction   . History of gout   . Knee pain   . History of compression fracture of spine   . Myocardial infarction ([place]) 2005  . Arthritis   . AAA (abdominal aortic aneurysm)     06/02/2003 old BPG  . Carotid artery occlusion     Social History Social History  Substance Use Topics  . Smoking status: Former Smoker -- 1.50 packs/day for 20 years    Types: Cigarettes    Quit date: 01/22/1982  . Smokeless tobacco: Never Used  . Alcohol Use: No    Family History Family History  Problem Relation Age of Onset  . Heart disease Father     Heart Disease before age 67  . Heart attack Father   . Cancer Mother     Colon  . Heart disease Mother   . Hyperlipidemia Mother   . Hypertension Mother   . Heart attack Mother   . Colon cancer Mother   . Cancer Brother     lung cancer    Surgical History Past Surgical History  Procedure Laterality Date  . Coronary angioplasty  2005    Allergies  Allergen Reactions  . Doxycycline Rash  REACTION: rash  . Penicillins Rash    REACTION: rash  . Sulfonamide Derivatives Rash    REACTION: rash    Current Outpatient Prescriptions  Medication Sig Dispense Refill  . allopurinol (ZYLOPRIM) 300 MG tablet Take 1 tablet (300 mg total) by mouth daily. 90 tablet 3  . aspirin 325 MG tablet Take 325 mg by mouth daily.      . Cholecalciferol (VITAMIN D) 1000 UNITS capsule Take 1,000 Units by mouth daily.      . clopidogrel (PLAVIX) 75 MG tablet Take 1 tablet (75 mg total) by mouth daily. 90 tablet 3  . indomethacin (INDOCIN) 25 MG capsule Take 1 capsule (25 mg total) by mouth 3 (three) times daily with meals. As needed for gout pain 90 capsule 5  . metoprolol (LOPRESSOR) 50 MG tablet Take 1 tablet by mouth  twice a day 180 tablet 3  . nitroGLYCERIN (NITROSTAT) 0.4 MG SL tablet Place 0.4 mg under the tongue every 5 (five)  minutes as needed.      . pantoprazole (PROTONIX) 40 MG tablet Take 1 tablet (40 mg total) by mouth daily. 90 tablet 3  . simvastatin (ZOCOR) 40 MG tablet Take 1 tablet (40 mg total) by mouth every evening. 90 tablet 3   No current facility-administered medications for this visit.     REVIEW OF SYSTEMS: See HPI for pertinent positives and negatives.  Physical Examination Filed Vitals:   12/24/14 0907 12/24/14 0910  BP: 138/74 125/78  Pulse: 59 60  Temp:  97 F (36.1 C)  TempSrc:  Oral  Resp:  14  Height:  5\' 10"  (1.778 m)  Weight:  168 lb (76.204 kg)  SpO2:  97%   Body mass index is 24.11 kg/(m^2).   General: WDWN in NAD Gait: Normal HENT: WNL Eyes: Pupils equal and react to light Pulmonary: normal non-labored breathing , without Rales, rhonchi, or wheezing Cardiac: RRR, no murmur detected  Abdomen: soft, NT, no masses palpated Skin: no rashes, no ulcers, no cellulitis.   VASCULAR EXAM  Carotid Bruits Right Left   Negative Positive   Aorta is palpable Radial pulses are 2+ palpable and =.   VASCULAR EXAM: Extremities without ischemic changes,  without Gangrene, no open wounds.     LE Pulses Right Left   FEMORAL not palpable 2+ palpable    POPLITEAL 3+ palpable  2+ palpable   POSTERIOR TIBIAL not palpable  not palpable    DORSALIS PEDIS  ANTERIOR TIBIAL 2+ palpable  2+ palpable     Musculoskeletal: no muscle wasting or atrophy; no peripheral edema Neurologic: A&O X 3; Appropriate Affect ;  SENSATION: normal; MOTOR FUNCTION: 5/5 Symmetric, CN 2-12 intact Speech is fluent/normal         Non-Invasive Vascular Imaging:   CEREBROVASCULAR DUPLEX EVALUATION    INDICATION: Carotid disease with known left subclavian stenosis.    PREVIOUS  INTERVENTION(S):     DUPLEX EXAM:     RIGHT  LEFT  Peak Systolic Velocities (cm/s) End Diastolic Velocities (cm/s) Plaque LOCATION Peak Systolic Velocities (cm/s) End Diastolic Velocities (cm/s) Plaque  73 12  CCA PROXIMAL 68 12   72 16  CCA MID 95 12   63 12 HT CCA DISTAL 53 11   52 4.9 HT ECA 92.6 10.9 HT  53 16 HT ICA PROXIMAL 61 16 HT  52 14  ICA MID 56 15   50 13  ICA DISTAL 57 15     0.8 ICA / CCA Ratio (PSV)  1.1  Antegrade Vertebral Flow Retrograde  [id number] Brachial Systolic Pressure (mmHg) 123456  Multiphasic (subclavian artery) Brachial Artery Waveforms Multiphasic (subclavian artery)    Plaque Morphology:  HM = Homogeneous, HT = Heterogeneous, CP = Calcific Plaque, SP = Smooth Plaque, IP = Irregular Plaque     ADDITIONAL FINDINGS: . No significant stenosis of the bilateral external or common carotid arteries. . Velocity of 366 cm/s noted in the left proximal subclavian artery. . Mostly retrograde, bidirectional flow noted in the left vertebral artery. . Greater than 53mm/Mg difference in the bilateral brachial pressures.    IMPRESSION: 1. Doppler velocities suggest less than 40% stenoses of the bilateral proximal internal carotid arteries. 2. Abnormal findings of the left vertebral artery and bilateral brachial pressures, as described above.     Compared to the previous exam:  No significant change noted when compared to the previous exam on 12/11/2013.      ABDOMINAL AORTA DUPLEX EVALUATION    INDICATION: Abdominal aortic aneurysm    PREVIOUS INTERVENTION(S):     DUPLEX EXAM:     LOCATION DIAMETER AP (cm) DIAMETER TRANSVERSE (cm) VELOCITIES (cm/sec)  Aorta Proximal 2.4 2.8 42.9  Aorta Mid 2.8 2.8 36.3  Aorta Distal 4.2 4.1 28.9  Right Common Iliac Artery 1.0 1.0 171  Left Common Iliac Artery 1.0 1.0 164    Previous max aortic diameter:  4.0 x 4.4cm Date: 12/11/2013     ADDITIONAL FINDINGS: Decreased visualization of the abdominal vasculature due to overlying  bowel gas.    IMPRESSION: 1. Aneurysmal dilatation of the distal abdominal aorta with a maximum diameter of 4.4cm. 2. No hemodynamically significant stenosis of the abdominal aorta and bilateral proximal common iliac arteries.    Compared to the previous exam:  No significant change in the abdominal aortic aneurysm when compared to the previous exam on 12/11/2013.      ASSESSMENT:  Justin Lyons is a 79 y.o. male who has a history of a stable asymptomatic AAA and asymptomatic carotid artery stenoses and left sublcalvian artery stenosis. He has no steal symptoms in either upper extremity.  He denies back or abdominal pain.  Today's carotid duplex suggests less than 40% stenoses of the bilateral proximal internal carotid arteries. Abnormal findings of the left vertebral artery and bilateral brachial pressures. No significant change noted when compared to the previous exam on 12/11/2013.   Today's AAA duplex suggests aneurysmal dilatation of the distal abdominal aorta with a maximum diameter of 4.2cm. No hemodynamically significant stenosis of the abdominal aorta and bilateral proximal common iliac arteries. No significant change in the abdominal aortic aneurysm when compared to the previous exam on 12/11/2013.  Prominent bilateral popliteal pulses with no claudication symptoms; will check popliteal pulses by duplex in a month to evaluate for presence or absence of popliteal aneurysms.   PLAN:   Based on today's exam and non-invasive vascular lab results, the patient will follow up in 1 month with the following tests: bilateral popliteal duplex. Follow up in a year with AAA duplex and carotid duplex I discussed in depth with the patient the nature of atherosclerosis, and emphasized the importance of maximal medical management including strict control of blood pressure, blood glucose, and lipid levels, obtaining regular exercise, and cessation of smoking.  The patient is aware that without  maximal medical management the underlying atherosclerotic disease process will progress, limiting the benefit of any interventions. Consideration for repair of AAA would be made when the size approaches 4.8 or 5.0 cm, growth >  1 cm/yr, and symptomatic status. The patient was given information about stroke prevention and what symptoms should prompt the patient to seek immediate medical care. The patient was given information about AAA including signs, symptoms, treatment,  what symptoms should prompt the patient to seek immediate medical care, and how to minimize the risk of enlargement and rupture of aneurysms. . Thank you for allowing [place] to participate in this patient's care.  Clemon Chambers, RN, MSN, FNP-C [facility] Office: [phone]  Clinic MD: Bridgett Larsson  12/24/2014 8:55 AM

## 2015-01-20 ENCOUNTER — Encounter: Payer: Self-pay | Admitting: Family

## 2015-01-26 ENCOUNTER — Ambulatory Visit (HOSPITAL_COMMUNITY)
Admission: RE | Admit: 2015-01-26 | Discharge: 2015-01-26 | Disposition: A | Discharge status: Home / Self Care | Payer: Commercial Managed Care - HMO | Source: Ambulatory Visit | Attending: Vascular Surgery | Admitting: Vascular Surgery

## 2015-01-26 DIAGNOSIS — Z87891 Personal history of nicotine dependence: Secondary | ICD-10-CM

## 2015-01-26 DIAGNOSIS — I771 Stricture of artery: Secondary | ICD-10-CM

## 2015-01-26 DIAGNOSIS — E785 Hyperlipidemia, unspecified: Secondary | ICD-10-CM | POA: Insufficient documentation

## 2015-01-26 DIAGNOSIS — R0989 Other specified symptoms and signs involving the circulatory and respiratory systems: Secondary | ICD-10-CM | POA: Diagnosis not present

## 2015-01-26 DIAGNOSIS — I714 Abdominal aortic aneurysm, without rupture, unspecified: Secondary | ICD-10-CM

## 2015-01-26 DIAGNOSIS — I6523 Occlusion and stenosis of bilateral carotid arteries: Secondary | ICD-10-CM | POA: Diagnosis not present

## 2015-01-26 DIAGNOSIS — I10 Essential (primary) hypertension: Secondary | ICD-10-CM | POA: Insufficient documentation

## 2015-01-26 DIAGNOSIS — I708 Atherosclerosis of other arteries: Secondary | ICD-10-CM

## 2015-01-28 ENCOUNTER — Ambulatory Visit (INDEPENDENT_AMBULATORY_CARE_PROVIDER_SITE_OTHER): Payer: Commercial Managed Care - HMO | Admitting: Family

## 2015-01-28 ENCOUNTER — Encounter: Payer: Self-pay | Admitting: Family

## 2015-01-28 VITALS — BP 123/80 | HR 60 | Temp 97.0°F | Resp 20 | Ht 70.0 in | Wt 169.9 lb

## 2015-01-28 DIAGNOSIS — I6523 Occlusion and stenosis of bilateral carotid arteries: Secondary | ICD-10-CM | POA: Diagnosis not present

## 2015-01-28 DIAGNOSIS — R0989 Other specified symptoms and signs involving the circulatory and respiratory systems: Secondary | ICD-10-CM | POA: Diagnosis not present

## 2015-01-28 DIAGNOSIS — Z87891 Personal history of nicotine dependence: Secondary | ICD-10-CM

## 2015-01-28 DIAGNOSIS — I771 Stricture of artery: Secondary | ICD-10-CM

## 2015-01-28 DIAGNOSIS — I708 Atherosclerosis of other arteries: Secondary | ICD-10-CM | POA: Diagnosis not present

## 2015-01-28 DIAGNOSIS — I714 Abdominal aortic aneurysm, without rupture, unspecified: Secondary | ICD-10-CM

## 2015-01-28 NOTE — Progress Notes (Addendum)
[facility] [place] HISTORY AND PHYSICAL   MRN : [id number]  History of Present Illness:   Justin Lyons is a 80 y.o. male followed for known AAA and asymptomatic left subclavian artery stenosis. He returns today for duplex evaluation of bilateral prominent popliteal pulses.   The pt denies any history of stroke or TIA, he denies back or abdominal pain, denies tingling, numbness, weakness, cold sensation, or pain in either hand or arm. He denies claudication symptoms with walking. Pt indicates that his PCP heard a carotid bruit which is why he was referred for carotid Duplex monitoring.  He states he is physically active.  Pt Diabetic: No Pt smoker: former smoker, quit in 1989  Pt meds include: Statin :Yes Betablocker: Yes ASA: Yes Other anticoagulants/antiplatelets: Plavix    Current Outpatient Prescriptions  Medication Sig Dispense Refill  . allopurinol (ZYLOPRIM) 300 MG tablet Take 1 tablet (300 mg total) by mouth daily. 90 tablet 3  . aspirin 325 MG tablet Take 325 mg by mouth daily.      . Cholecalciferol (VITAMIN D) 1000 UNITS capsule Take 1,000 Units by mouth daily.      . clopidogrel (PLAVIX) 75 MG tablet Take 1 tablet (75 mg total) by mouth daily. 90 tablet 3  . indomethacin (INDOCIN) 25 MG capsule Take 1 capsule (25 mg total) by mouth 3 (three) times daily with meals. As needed for gout pain 90 capsule 5  . metoprolol (LOPRESSOR) 50 MG tablet Take 1 tablet by mouth  twice a day 180 tablet 3  . nitroGLYCERIN (NITROSTAT) 0.4 MG SL tablet Place 0.4 mg under the tongue every 5 (five) minutes as needed.      . pantoprazole (PROTONIX) 40 MG tablet Take 1 tablet (40 mg total) by mouth daily. 90 tablet 3  . simvastatin (ZOCOR) 40 MG tablet Take 1 tablet (40 mg total) by mouth every evening. 90 tablet 3   No current facility-administered medications for this visit.    Past Medical History  Diagnosis Date  . COPD (chronic obstructive pulmonary  disease) ([place])   . Hypertension   . Coronary artery disease   . Peripheral vascular disease ([place])   . Carotid bruit   . Hyperlipidemia   . Diverticulosis of colon   . Hemorrhoids, internal   . Erectile dysfunction   . History of gout   . Knee pain   . History of compression fracture of spine   . Myocardial infarction ([place]) 2005  . Arthritis   . AAA (abdominal aortic aneurysm) ([place])     06/02/2003 old BPG  . Carotid artery occlusion     Social History Social History  Substance Use Topics  . Smoking status: Former Smoker -- 1.50 packs/day for 20 years    Types: Cigarettes    Quit date: 01/22/1982  . Smokeless tobacco: Never Used  . Alcohol Use: No    Family History Family History  Problem Relation Age of Onset  . Heart disease Father     Heart Disease before age 42  . Heart attack Father   . Cancer Mother     Colon  . Heart disease Mother   . Hyperlipidemia Mother   . Hypertension Mother   . Heart attack Mother   . Colon cancer Mother   . Cancer Brother     lung cancer    Surgical History Past Surgical History  Procedure Laterality Date  . Coronary angioplasty  2005    Allergies  Allergen Reactions  .  Doxycycline Rash    REACTION: rash  . Penicillins Rash    REACTION: rash  . Sulfonamide Derivatives Rash    REACTION: rash    Current Outpatient Prescriptions  Medication Sig Dispense Refill  . allopurinol (ZYLOPRIM) 300 MG tablet Take 1 tablet (300 mg total) by mouth daily. 90 tablet 3  . aspirin 325 MG tablet Take 325 mg by mouth daily.      . Cholecalciferol (VITAMIN D) 1000 UNITS capsule Take 1,000 Units by mouth daily.      . clopidogrel (PLAVIX) 75 MG tablet Take 1 tablet (75 mg total) by mouth daily. 90 tablet 3  . indomethacin (INDOCIN) 25 MG capsule Take 1 capsule (25 mg total) by mouth 3 (three) times daily with meals. As needed for gout pain 90 capsule 5  . metoprolol (LOPRESSOR) 50 MG tablet Take 1 tablet by mouth  twice a day 180 tablet 3   . nitroGLYCERIN (NITROSTAT) 0.4 MG SL tablet Place 0.4 mg under the tongue every 5 (five) minutes as needed.      . pantoprazole (PROTONIX) 40 MG tablet Take 1 tablet (40 mg total) by mouth daily. 90 tablet 3  . simvastatin (ZOCOR) 40 MG tablet Take 1 tablet (40 mg total) by mouth every evening. 90 tablet 3   No current facility-administered medications for this visit.     REVIEW OF SYSTEMS: See HPI for pertinent positives and negatives.  Physical Examination Filed Vitals:   01/28/15 1116 01/28/15 1121 01/28/15 1122  BP: 142/70 148/67 123/80  Pulse: 60    Temp: 97 F (36.1 C)    TempSrc: Oral    Resp: 20    Height: 5\' 10"  (1.778 m)    Weight: 169 lb 14.4 oz (77.066 kg)     Body mass index is 24.38 kg/(m^2).  General: WDWN in NAD Gait: Normal HENT: WNL Eyes: Pupils equal and react to light Pulmonary: normal non-labored breathing, no rales, rhonchi, or wheezing Cardiac: RRR, no murmur detected  Abdomen: soft, NT, no masses palpated Skin: no rashes, no ulcers, no cellulitis.   VASCULAR EXAM  Carotid Bruits Right Left   Negative Positive   Aorta is palpable Radial pulses are 2+ palpable and =.   VASCULAR EXAM: Extremities without ischemic changes,  without Gangrene, no open wounds.     LE Pulses Right Left   FEMORAL not palpable faintly palpable    POPLITEAL 2+ palpable  1+ palpable   POSTERIOR TIBIAL 1+ palpable  not palpable    DORSALIS PEDIS  ANTERIOR TIBIAL 2+ palpable  2+ palpable     Musculoskeletal: no muscle wasting or atrophy; no peripheral edema Neurologic: A&O X 3; Appropriate Affect ;  SENSATION: normal; MOTOR FUNCTION: 5/5 Symmetric, CN 2-12 intact Speech is fluent/normal              Non-Invasive  Vascular Imaging (01/25/2014): Bilateral popliteal artery duplex: No evidence of aneurysmal dilatation of the bilateral popliteal arteries, no hemodynamically significant stenosis.    ASSESSMENT:  Justin Lyons is a 80 y.o. male who has a history of a stable asymptomatic AAA, mild asymptomatic carotid artery stenoses, and left sublcalvian artery stenosis. He has no steal symptoms in either upper extremity.  He denies back or abdominal pain. Bilateral prominent popliteal pulses: on duplex no aneurysmal dilatation.    PLAN:   Based on today's exam and non-invasive vascular lab results, the patient will follow up in 11 months as should already be scheduled with AAA duplex and carotid duplex  I discussed in depth with the patient the nature of atherosclerosis, and emphasized the importance of maximal medical management including strict control of blood pressure, blood glucose, and lipid levels, obtaining regular exercise, and cessation of smoking.  The patient is aware that without maximal medical management the underlying atherosclerotic disease process will progress, limiting the benefit of any interventions. Consideration for repair of AAA would be made when the size approaches 4.8 or 5.0 cm, growth > 1 cm/yr, and symptomatic status. The patient was given information about stroke prevention and what symptoms should prompt the patient to seek immediate medical care. The patient was given information about AAA including signs, symptoms, treatment,  what symptoms should prompt the patient to seek immediate medical care, and how to minimize the risk of enlargement and rupture of aneurysms.  Thank you for allowing [place] to participate in this patient's care.  Clemon Chambers, RN, MSN, FNP-C [facility] Office: [phone]  Clinic MD: Bridgett Larsson  01/28/2015 11:24 AM

## 2015-01-28 NOTE — Progress Notes (Signed)
Filed Vitals:   01/28/15 1116 01/28/15 1121 01/28/15 1122  BP: 142/70 148/67 123/80  Pulse: 60    Temp: 97 F (36.1 C)    TempSrc: Oral    Resp: 20    Height: 5\' 10"  (1.778 m)    Weight: 169 lb 14.4 oz (77.066 kg)

## 2015-03-28 DIAGNOSIS — I10 Essential (primary) hypertension: Secondary | ICD-10-CM | POA: Diagnosis not present

## 2015-03-28 DIAGNOSIS — J449 Chronic obstructive pulmonary disease, unspecified: Secondary | ICD-10-CM | POA: Diagnosis not present

## 2015-03-28 DIAGNOSIS — I2510 Atherosclerotic heart disease of native coronary artery without angina pectoris: Secondary | ICD-10-CM | POA: Diagnosis not present

## 2015-03-28 DIAGNOSIS — M109 Gout, unspecified: Secondary | ICD-10-CM | POA: Diagnosis not present

## 2015-03-28 DIAGNOSIS — E782 Mixed hyperlipidemia: Secondary | ICD-10-CM | POA: Diagnosis not present

## 2015-04-05 DIAGNOSIS — I10 Essential (primary) hypertension: Secondary | ICD-10-CM | POA: Diagnosis not present

## 2015-04-05 DIAGNOSIS — E782 Mixed hyperlipidemia: Secondary | ICD-10-CM | POA: Diagnosis not present

## 2015-04-05 DIAGNOSIS — J019 Acute sinusitis, unspecified: Secondary | ICD-10-CM | POA: Diagnosis not present

## 2015-04-05 DIAGNOSIS — J449 Chronic obstructive pulmonary disease, unspecified: Secondary | ICD-10-CM | POA: Diagnosis not present

## 2015-04-05 DIAGNOSIS — R05 Cough: Secondary | ICD-10-CM | POA: Diagnosis not present

## 2015-08-01 DIAGNOSIS — H6120 Impacted cerumen, unspecified ear: Secondary | ICD-10-CM | POA: Insufficient documentation

## 2015-08-02 DIAGNOSIS — H6122 Impacted cerumen, left ear: Secondary | ICD-10-CM | POA: Diagnosis not present

## 2015-08-02 DIAGNOSIS — R05 Cough: Secondary | ICD-10-CM | POA: Diagnosis not present

## 2015-08-02 DIAGNOSIS — Z6825 Body mass index (BMI) 25.0-25.9, adult: Secondary | ICD-10-CM | POA: Diagnosis not present

## 2015-09-12 DIAGNOSIS — Z6825 Body mass index (BMI) 25.0-25.9, adult: Secondary | ICD-10-CM | POA: Diagnosis not present

## 2015-09-12 DIAGNOSIS — R0982 Postnasal drip: Secondary | ICD-10-CM | POA: Diagnosis not present

## 2015-09-16 DIAGNOSIS — Z6825 Body mass index (BMI) 25.0-25.9, adult: Secondary | ICD-10-CM | POA: Diagnosis not present

## 2015-09-16 DIAGNOSIS — J209 Acute bronchitis, unspecified: Secondary | ICD-10-CM | POA: Diagnosis not present

## 2015-09-16 DIAGNOSIS — R05 Cough: Secondary | ICD-10-CM | POA: Diagnosis not present

## 2015-10-04 DIAGNOSIS — Z23 Encounter for immunization: Secondary | ICD-10-CM | POA: Insufficient documentation

## 2015-10-05 DIAGNOSIS — Z6825 Body mass index (BMI) 25.0-25.9, adult: Secondary | ICD-10-CM | POA: Diagnosis not present

## 2015-10-05 DIAGNOSIS — J209 Acute bronchitis, unspecified: Secondary | ICD-10-CM | POA: Diagnosis not present

## 2015-10-05 DIAGNOSIS — Z23 Encounter for immunization: Secondary | ICD-10-CM | POA: Diagnosis not present

## 2015-11-03 DIAGNOSIS — R05 Cough: Secondary | ICD-10-CM | POA: Diagnosis not present

## 2015-11-03 DIAGNOSIS — J209 Acute bronchitis, unspecified: Secondary | ICD-10-CM | POA: Diagnosis not present

## 2015-11-03 DIAGNOSIS — Z6824 Body mass index (BMI) 24.0-24.9, adult: Secondary | ICD-10-CM | POA: Diagnosis not present

## 2015-11-16 DIAGNOSIS — R059 Cough, unspecified: Secondary | ICD-10-CM | POA: Insufficient documentation

## 2015-11-17 DIAGNOSIS — R05 Cough: Secondary | ICD-10-CM | POA: Diagnosis not present

## 2015-12-26 ENCOUNTER — Encounter: Payer: Self-pay | Admitting: Family

## 2015-12-30 ENCOUNTER — Ambulatory Visit (HOSPITAL_COMMUNITY)
Admission: RE | Admit: 2015-12-30 | Discharge: 2015-12-30 | Disposition: A | Discharge status: Home / Self Care | Payer: Commercial Managed Care - HMO | Source: Ambulatory Visit | Attending: Family | Admitting: Family

## 2015-12-30 ENCOUNTER — Encounter: Payer: Self-pay | Admitting: Family

## 2015-12-30 ENCOUNTER — Ambulatory Visit (INDEPENDENT_AMBULATORY_CARE_PROVIDER_SITE_OTHER)
Admission: RE | Admit: 2015-12-30 | Discharge: 2015-12-30 | Disposition: A | Discharge status: Home / Self Care | Payer: Commercial Managed Care - HMO | Source: Ambulatory Visit | Attending: Family | Admitting: Family

## 2015-12-30 ENCOUNTER — Ambulatory Visit (INDEPENDENT_AMBULATORY_CARE_PROVIDER_SITE_OTHER): Payer: Commercial Managed Care - HMO | Admitting: Family

## 2015-12-30 VITALS — BP 122/75 | HR 59 | Temp 97.0°F | Resp 20 | Ht 70.0 in | Wt 169.5 lb

## 2015-12-30 DIAGNOSIS — I771 Stricture of artery: Secondary | ICD-10-CM | POA: Diagnosis not present

## 2015-12-30 DIAGNOSIS — I714 Abdominal aortic aneurysm, without rupture, unspecified: Secondary | ICD-10-CM

## 2015-12-30 DIAGNOSIS — R0989 Other specified symptoms and signs involving the circulatory and respiratory systems: Secondary | ICD-10-CM

## 2015-12-30 DIAGNOSIS — Z87891 Personal history of nicotine dependence: Secondary | ICD-10-CM | POA: Diagnosis not present

## 2015-12-30 DIAGNOSIS — I6523 Occlusion and stenosis of bilateral carotid arteries: Secondary | ICD-10-CM | POA: Insufficient documentation

## 2015-12-30 NOTE — Progress Notes (Signed)
[facility] [place] HISTORY AND PHYSICAL   MRN : [id number]  History of Present Illness:   Justin Lyons is a 80 y.o. malepatient whom Dr. Kellie Simmering has been monitoring for known AAA and asymptomatic left subclavian artery stenosis. He returns today for follow up.  The pt denies any history of stroke or TIA, he denies back or abdominal pain, denies tingling, numbness, weakness, cold sensation, or pain in either hand or arm. He denies claudication symptoms with walking. Pt indicates that his PCP heard a carotid bruit which is why he was referred for carotid Duplex monitoring.  He states he is physically active.  Pt Diabetic: No Pt smoker: former smoker, quit in 1989  Pt meds include: Statin :Yes Betablocker: Yes ASA: Yes Other anticoagulants/antiplatelets: Plavix    Current Outpatient Prescriptions  Medication Sig Dispense Refill  . allopurinol (ZYLOPRIM) 300 MG tablet Take 1 tablet (300 mg total) by mouth daily. 90 tablet 3  . aspirin 325 MG tablet Take 325 mg by mouth daily.      . Cholecalciferol (VITAMIN D) 1000 UNITS capsule Take 1,000 Units by mouth daily.      . clopidogrel (PLAVIX) 75 MG tablet Take 1 tablet (75 mg total) by mouth daily. 90 tablet 3  . indomethacin (INDOCIN) 25 MG capsule Take 1 capsule (25 mg total) by mouth 3 (three) times daily with meals. As needed for gout pain 90 capsule 5  . metoprolol (LOPRESSOR) 50 MG tablet Take 1 tablet by mouth  twice a day 180 tablet 3  . nitroGLYCERIN (NITROSTAT) 0.4 MG SL tablet Place 0.4 mg under the tongue every 5 (five) minutes as needed.      . pantoprazole (PROTONIX) 40 MG tablet Take 1 tablet (40 mg total) by mouth daily. 90 tablet 3  . simvastatin (ZOCOR) 40 MG tablet Take 1 tablet (40 mg total) by mouth every evening. 90 tablet 3   No current facility-administered medications for this visit.     Past Medical History:  Diagnosis Date  . AAA (abdominal aortic aneurysm) ([place])    06/02/2003  old BPG  . Arthritis   . Carotid artery occlusion   . Carotid bruit   . COPD (chronic obstructive pulmonary disease) ([place])   . Coronary artery disease   . Diverticulosis of colon   . Erectile dysfunction   . Hemorrhoids, internal   . History of compression fracture of spine   . History of gout   . Hyperlipidemia   . Hypertension   . Knee pain   . Myocardial infarction 2005  . Peripheral vascular disease [facility])     Social History Social History  Substance Use Topics  . Smoking status: Former Smoker    Packs/day: 1.50    Years: 20.00    Types: Cigarettes    Quit date: 01/22/1982  . Smokeless tobacco: Never Used  . Alcohol use No    Family History Family History  Problem Relation Age of Onset  . Heart disease Father     Heart Disease before age 78  . Heart attack Father   . Cancer Mother     Colon  . Heart disease Mother   . Hyperlipidemia Mother   . Hypertension Mother   . Heart attack Mother   . Colon cancer Mother   . Cancer Brother     lung cancer    Surgical History Past Surgical History:  Procedure Laterality Date  . CORONARY ANGIOPLASTY  2005    Allergies  Allergen Reactions  . Doxycycline Rash    REACTION: rash  . Penicillins Rash    REACTION: rash  . Sulfonamide Derivatives Rash    REACTION: rash    Current Outpatient Prescriptions  Medication Sig Dispense Refill  . allopurinol (ZYLOPRIM) 300 MG tablet Take 1 tablet (300 mg total) by mouth daily. 90 tablet 3  . aspirin 325 MG tablet Take 325 mg by mouth daily.      . Cholecalciferol (VITAMIN D) 1000 UNITS capsule Take 1,000 Units by mouth daily.      . clopidogrel (PLAVIX) 75 MG tablet Take 1 tablet (75 mg total) by mouth daily. 90 tablet 3  . indomethacin (INDOCIN) 25 MG capsule Take 1 capsule (25 mg total) by mouth 3 (three) times daily with meals. As needed for gout pain 90 capsule 5  . metoprolol (LOPRESSOR) 50 MG tablet Take 1 tablet by mouth  twice a day 180 tablet 3  . nitroGLYCERIN  (NITROSTAT) 0.4 MG SL tablet Place 0.4 mg under the tongue every 5 (five) minutes as needed.      . pantoprazole (PROTONIX) 40 MG tablet Take 1 tablet (40 mg total) by mouth daily. 90 tablet 3  . simvastatin (ZOCOR) 40 MG tablet Take 1 tablet (40 mg total) by mouth every evening. 90 tablet 3   No current facility-administered medications for this visit.      REVIEW OF SYSTEMS: See HPI for pertinent positives and negatives.  Physical Examination Vitals:   12/30/15 0917 12/30/15 0919  BP: (!) 144/76 122/75  Pulse: (!) 59   Resp: 20   Temp: 97 F (36.1 C)   TempSrc: Oral   SpO2: 96%   Weight: 169 lb 8 oz (76.9 kg)   Height: 5\' 10"  (1.778 m)    Body mass index is 24.32 kg/m.  General:  WDWN in NAD Gait: Normal HENT: WNL Eyes: Pupils equal and react to light Pulmonary: normal non-labored breathing, good air movement in all fields, no rales, rhonchi, or wheezing Cardiac: RRR, no murmur detected  Abdomen: soft, NT, no masses palpated Skin: no rashes, no ulcers, no cellulitis.   VASCULAR EXAM  Carotid Bruits Right Left   Negative Positive   Aorta is palpable Radial pulses are 2+ palpable and =.   VASCULAR EXAM: Extremities without ischemic changes,  without Gangrene, no open wounds.     LE Pulses Right Left   FEMORAL not palpable faintly palpable    POPLITEAL 2+ palpable  1+ palpable   POSTERIOR TIBIAL not palpable  not palpable    DORSALIS PEDIS  ANTERIOR TIBIAL 2+ palpable  2+ palpable     Musculoskeletal: no muscle wasting or atrophy; no peripheral edema Neurologic: A&O X 3; Appropriate Affect ;  SENSATION: normal; MOTOR FUNCTION: 5/5 Symmetric, CN 2-12 intact Speech is fluent/normal      ASSESSMENT:  Justin Lyons is a 80 y.o. male who has a history of a stable asymptomatic AAA, mild asymptomatic carotid artery stenoses, and left sublcalvian artery stenosis with brachial pressure gradient. He has no steal symptoms in either upper extremity.  He denies back or abdominal pain.  He does not have DM and quit smoking in 1989.  DATA  Bilateral prominent popliteal pulses: duplex exam on 01-28-15: no aneurysmal dilatation of popliteal arteries.   Today's carotid duplex suggests bilateral ICA stenosis <40%. Right vertebral artery flow is antegrade, right subclavian artery waveforms are normal. Left vertebral artery flow is retrograde, left subclavian artery waveforms are stenotic.  No  significant change in comparison to the last exam on 12-24-14.   Today's AAA duplex demonstrates decreased visualization of the common iliac arteries due to overlying bowel gas. Patent abdominal aortic aneurysm measuring approximately 4.12 cm at the largest diameter.  No significant change compared to 4.2 cm AAA measured on 12-24-14.    PLAN:   Based on today's exam and non-invasive vascular lab results, the patient will follow up in 1 year with the following tests: AAA duplex and carotid duplex. I discussed in depth with the patient the nature of atherosclerosis, and emphasized the importance of maximal medical management including strict control of blood pressure, blood glucose, and lipid levels, obtaining regular exercise, and cessation of smoking.  The patient is aware that without maximal medical management the underlying atherosclerotic disease process will progress, limiting the benefit of any interventions. Consideration for repair of AAA would be made when the size approaches 4.8 or 5.0 cm, growth > 1 cm/yr, and symptomatic status. The patient was given information about stroke prevention and what symptoms should prompt the patient to seek immediate medical care. The patient was given information about AAA  including signs, symptoms, treatment,  what symptoms should prompt the patient to seek immediate medical care, and how to minimize the risk of enlargement and rupture of aneurysms.  Thank you for allowing [place] to participate in this patient's care.  Clemon Chambers, RN, MSN, FNP-C [facility] Office: [phone]  Clinic MD: Donzetta Matters 12/30/2015 9:28 AM

## 2015-12-30 NOTE — Patient Instructions (Addendum)
Abdominal Aortic Aneurysm Blood pumps away from the heart through tubes (blood vessels) called arteries. Aneurysms are weak or damaged places in the wall of an artery. It bulges out like a balloon. An abdominal aortic aneurysm happens in the main artery of the body (aorta). It can burst or tear, causing bleeding inside the body. This is an emergency. It needs treatment right away. What are the causes? The exact cause is unknown. Things that could cause this problem include:  Fat and other substances building up in the lining of a tube.  Swelling of the walls of a blood vessel.  Certain tissue diseases.  Belly (abdominal) trauma.  An infection in the main artery of the body. What increases the risk? There are things that make it more likely for you to have an aneurysm. These include:  Being over the age of 78 years old.  Having high blood pressure (hypertension).  Being a male.  Being white.  Being very overweight (obese).  Having a family history of aneurysm.  Using tobacco products. What are the signs or symptoms? Symptoms depend on the size of the aneurysm and how fast it grows. There may not be symptoms. If symptoms occur, they can include:  Pain (belly, side, lower back, or groin).  Feeling full after eating a small amount of food.  Feeling sick to your stomach (nauseous), throwing up (vomiting), or both.  Feeling a lump in your belly that feels like it is beating (pulsating).  Feeling like you will pass out (faint). How is this treated?  Medicine to control blood pressure and pain.  Imaging tests to see if the aneurysm gets bigger.  Surgery. How is this prevented? To lessen your chance of getting this condition:  Stop smoking. Stop chewing tobacco.  Limit or avoid alcohol.  Keep your blood pressure, blood sugar, and cholesterol within normal limits.  Eat less salt.  Eat foods low in saturated fats and cholesterol. These are found in animal and whole  dairy products.  Eat more fiber. Fiber is found in whole grains, vegetables, and fruits.  Keep a healthy weight.  Stay active and exercise often. This information is not intended to replace advice given to you by your health care provider. Make sure you discuss any questions you have with your health care provider. Document Released: 05/05/2012 Document Revised: 06/16/2015 Document Reviewed: 02/08/2012 Elsevier Interactive Patient Education  2017 [employer].     Before your next abdominal ultrasound:  Take two Extra-Strength Gas-X capsules at bedtime the night before the test. Take another two Extra-Strength Gas-X capsules 3 hours before the test.       Stroke Prevention Some medical conditions and behaviors are associated with an increased chance of having a stroke. You may prevent a stroke by making healthy choices and managing medical conditions. How can I reduce my risk of having a stroke?  Stay physically active. Get at least 30 minutes of activity on most or all days.  Do not smoke. It may also be helpful to avoid exposure to secondhand smoke.  Limit alcohol use. Moderate alcohol use is considered to be:  No more than 2 drinks per day for men.  No more than 1 drink per day for nonpregnant women.  Eat healthy foods. This involves:  Eating 5 or more servings of fruits and vegetables a day.  Making dietary changes that address high blood pressure (hypertension), high cholesterol, diabetes, or obesity.  Manage your cholesterol levels.  Making food choices that are high  in fiber and low in saturated fat, trans fat, and cholesterol may control cholesterol levels.  Take any prescribed medicines to control cholesterol as directed by your health care provider.  Manage your diabetes.  Controlling your carbohydrate and sugar intake is recommended to manage diabetes.  Take any prescribed medicines to control diabetes as directed by your health care  provider.  Control your hypertension.  Making food choices that are low in salt (sodium), saturated fat, trans fat, and cholesterol is recommended to manage hypertension.  Ask your health care provider if you need treatment to lower your blood pressure. Take any prescribed medicines to control hypertension as directed by your health care provider.  If you are 43-70 years of age, have your blood pressure checked every 3-5 years. If you are 62 years of age or older, have your blood pressure checked every year.  Maintain a healthy weight.  Reducing calorie intake and making food choices that are low in sodium, saturated fat, trans fat, and cholesterol are recommended to manage weight.  Stop drug abuse.  Avoid taking birth control pills.  Talk to your health care provider about the risks of taking birth control pills if you are over 37 years old, smoke, get migraines, or have ever had a blood clot.  Get evaluated for sleep disorders (sleep apnea).  Talk to your health care provider about getting a sleep evaluation if you snore a lot or have excessive sleepiness.  Take medicines only as directed by your health care provider.  For some people, aspirin or blood thinners (anticoagulants) are helpful in reducing the risk of forming abnormal blood clots that can lead to stroke. If you have the irregular heart rhythm of atrial fibrillation, you should be on a blood thinner unless there is a good reason you cannot take them.  Understand all your medicine instructions.  Make sure that other conditions (such as anemia or atherosclerosis) are addressed. Get help right away if:  You have sudden weakness or numbness of the face, arm, or leg, especially on one side of the body.  Your face or eyelid droops to one side.  You have sudden confusion.  You have trouble speaking (aphasia) or understanding.  You have sudden trouble seeing in one or both eyes.  You have sudden trouble walking.  You  have dizziness.  You have a loss of balance or coordination.  You have a sudden, severe headache with no known cause.  You have new chest pain or an irregular heartbeat. Any of these symptoms may represent a serious problem that is an emergency. Do not wait to see if the symptoms will go away. Get medical help at once. Call your local emergency services (911 in [place] Do not drive yourself to the hospital.  This information is not intended to replace advice given to you by your health care provider. Make sure you discuss any questions you have with your health care provider. Document Released: 02/16/2004 Document Revised: 06/16/2015 Document Reviewed: 07/11/2012 Elsevier Interactive Patient Education  2017 [employer].

## 2016-01-17 DIAGNOSIS — R062 Wheezing: Secondary | ICD-10-CM | POA: Diagnosis not present

## 2016-01-17 DIAGNOSIS — J209 Acute bronchitis, unspecified: Secondary | ICD-10-CM | POA: Diagnosis not present

## 2016-01-17 DIAGNOSIS — Z6825 Body mass index (BMI) 25.0-25.9, adult: Secondary | ICD-10-CM | POA: Diagnosis not present

## 2016-04-01 DIAGNOSIS — Z6825 Body mass index (BMI) 25.0-25.9, adult: Secondary | ICD-10-CM | POA: Insufficient documentation

## 2016-04-01 DIAGNOSIS — R21 Rash and other nonspecific skin eruption: Secondary | ICD-10-CM | POA: Insufficient documentation

## 2016-04-01 DIAGNOSIS — R062 Wheezing: Secondary | ICD-10-CM | POA: Insufficient documentation

## 2016-04-02 DIAGNOSIS — J441 Chronic obstructive pulmonary disease with (acute) exacerbation: Secondary | ICD-10-CM | POA: Diagnosis not present

## 2016-04-02 DIAGNOSIS — R062 Wheezing: Secondary | ICD-10-CM | POA: Diagnosis not present

## 2016-04-02 DIAGNOSIS — R21 Rash and other nonspecific skin eruption: Secondary | ICD-10-CM | POA: Diagnosis not present

## 2016-04-02 DIAGNOSIS — Z6825 Body mass index (BMI) 25.0-25.9, adult: Secondary | ICD-10-CM | POA: Diagnosis not present

## 2016-10-03 DIAGNOSIS — A0472 Enterocolitis due to Clostridium difficile, not specified as recurrent: Secondary | ICD-10-CM | POA: Diagnosis not present

## 2016-10-03 DIAGNOSIS — R197 Diarrhea, unspecified: Secondary | ICD-10-CM | POA: Diagnosis not present

## 2016-10-03 DIAGNOSIS — E871 Hypo-osmolality and hyponatremia: Secondary | ICD-10-CM | POA: Diagnosis not present

## 2016-10-03 DIAGNOSIS — K56609 Unspecified intestinal obstruction, unspecified as to partial versus complete obstruction: Secondary | ICD-10-CM | POA: Diagnosis not present

## 2016-10-03 DIAGNOSIS — L539 Erythematous condition, unspecified: Secondary | ICD-10-CM | POA: Diagnosis not present

## 2016-10-03 DIAGNOSIS — I10 Essential (primary) hypertension: Secondary | ICD-10-CM | POA: Diagnosis not present

## 2016-10-03 DIAGNOSIS — N179 Acute kidney failure, unspecified: Secondary | ICD-10-CM | POA: Diagnosis not present

## 2016-10-03 DIAGNOSIS — E860 Dehydration: Secondary | ICD-10-CM | POA: Diagnosis not present

## 2016-10-03 DIAGNOSIS — E872 Acidosis: Secondary | ICD-10-CM | POA: Diagnosis not present

## 2016-10-03 DIAGNOSIS — D751 Secondary polycythemia: Secondary | ICD-10-CM | POA: Diagnosis not present

## 2016-10-03 DIAGNOSIS — R112 Nausea with vomiting, unspecified: Secondary | ICD-10-CM | POA: Diagnosis not present

## 2016-10-06 DIAGNOSIS — A029 Salmonella infection, unspecified: Secondary | ICD-10-CM | POA: Diagnosis not present

## 2016-10-06 DIAGNOSIS — E440 Moderate protein-calorie malnutrition: Secondary | ICD-10-CM | POA: Diagnosis not present

## 2016-10-06 DIAGNOSIS — K567 Ileus, unspecified: Secondary | ICD-10-CM | POA: Diagnosis not present

## 2016-10-06 DIAGNOSIS — R42 Dizziness and giddiness: Secondary | ICD-10-CM | POA: Diagnosis not present

## 2016-10-06 DIAGNOSIS — Z66 Do not resuscitate: Secondary | ICD-10-CM | POA: Diagnosis not present

## 2016-10-06 DIAGNOSIS — B370 Candidal stomatitis: Secondary | ICD-10-CM | POA: Diagnosis not present

## 2016-10-06 DIAGNOSIS — N179 Acute kidney failure, unspecified: Secondary | ICD-10-CM | POA: Diagnosis not present

## 2016-10-06 DIAGNOSIS — Z7902 Long term (current) use of antithrombotics/antiplatelets: Secondary | ICD-10-CM | POA: Diagnosis not present

## 2016-10-06 DIAGNOSIS — R9431 Abnormal electrocardiogram [ECG] [EKG]: Secondary | ICD-10-CM | POA: Diagnosis not present

## 2016-10-06 DIAGNOSIS — I739 Peripheral vascular disease, unspecified: Secondary | ICD-10-CM | POA: Diagnosis not present

## 2016-10-06 DIAGNOSIS — I2510 Atherosclerotic heart disease of native coronary artery without angina pectoris: Secondary | ICD-10-CM | POA: Diagnosis not present

## 2016-10-06 DIAGNOSIS — M109 Gout, unspecified: Secondary | ICD-10-CM | POA: Diagnosis not present

## 2016-10-06 DIAGNOSIS — Z743 Need for continuous supervision: Secondary | ICD-10-CM | POA: Diagnosis not present

## 2016-10-06 DIAGNOSIS — Z955 Presence of coronary angioplasty implant and graft: Secondary | ICD-10-CM | POA: Diagnosis not present

## 2016-10-06 DIAGNOSIS — I714 Abdominal aortic aneurysm, without rupture: Secondary | ICD-10-CM | POA: Diagnosis not present

## 2016-10-06 DIAGNOSIS — M1A9XX0 Chronic gout, unspecified, without tophus (tophi): Secondary | ICD-10-CM | POA: Diagnosis not present

## 2016-10-06 DIAGNOSIS — I491 Atrial premature depolarization: Secondary | ICD-10-CM | POA: Diagnosis not present

## 2016-10-06 DIAGNOSIS — I10 Essential (primary) hypertension: Secondary | ICD-10-CM | POA: Diagnosis not present

## 2016-10-06 DIAGNOSIS — R140 Abdominal distension (gaseous): Secondary | ICD-10-CM | POA: Diagnosis not present

## 2016-10-06 DIAGNOSIS — A0472 Enterocolitis due to Clostridium difficile, not specified as recurrent: Secondary | ICD-10-CM | POA: Diagnosis not present

## 2016-10-06 DIAGNOSIS — L270 Generalized skin eruption due to drugs and medicaments taken internally: Secondary | ICD-10-CM | POA: Diagnosis not present

## 2016-10-06 DIAGNOSIS — K56699 Other intestinal obstruction unspecified as to partial versus complete obstruction: Secondary | ICD-10-CM | POA: Diagnosis not present

## 2016-10-06 DIAGNOSIS — K56609 Unspecified intestinal obstruction, unspecified as to partial versus complete obstruction: Secondary | ICD-10-CM | POA: Diagnosis not present

## 2016-10-06 DIAGNOSIS — R279 Unspecified lack of coordination: Secondary | ICD-10-CM | POA: Diagnosis not present

## 2016-10-24 DIAGNOSIS — J309 Allergic rhinitis, unspecified: Secondary | ICD-10-CM | POA: Insufficient documentation

## 2016-10-24 DIAGNOSIS — Z6824 Body mass index (BMI) 24.0-24.9, adult: Secondary | ICD-10-CM | POA: Diagnosis not present

## 2016-10-24 DIAGNOSIS — A029 Salmonella infection, unspecified: Secondary | ICD-10-CM | POA: Insufficient documentation

## 2016-10-24 DIAGNOSIS — M109 Gout, unspecified: Secondary | ICD-10-CM | POA: Diagnosis not present

## 2016-10-24 DIAGNOSIS — I2510 Atherosclerotic heart disease of native coronary artery without angina pectoris: Secondary | ICD-10-CM | POA: Diagnosis not present

## 2016-11-11 DIAGNOSIS — A029 Salmonella infection, unspecified: Secondary | ICD-10-CM | POA: Diagnosis not present

## 2016-11-11 DIAGNOSIS — J309 Allergic rhinitis, unspecified: Secondary | ICD-10-CM | POA: Diagnosis not present

## 2016-11-11 DIAGNOSIS — Z6824 Body mass index (BMI) 24.0-24.9, adult: Secondary | ICD-10-CM | POA: Diagnosis not present

## 2016-11-11 DIAGNOSIS — M109 Gout, unspecified: Secondary | ICD-10-CM | POA: Diagnosis not present

## 2016-11-11 DIAGNOSIS — I2510 Atherosclerotic heart disease of native coronary artery without angina pectoris: Secondary | ICD-10-CM | POA: Diagnosis not present

## 2016-11-15 DIAGNOSIS — J209 Acute bronchitis, unspecified: Secondary | ICD-10-CM | POA: Diagnosis not present

## 2016-11-15 DIAGNOSIS — J441 Chronic obstructive pulmonary disease with (acute) exacerbation: Secondary | ICD-10-CM | POA: Diagnosis not present

## 2016-11-15 DIAGNOSIS — Z6824 Body mass index (BMI) 24.0-24.9, adult: Secondary | ICD-10-CM | POA: Diagnosis not present

## 2017-01-04 ENCOUNTER — Encounter: Payer: Self-pay | Admitting: Family

## 2017-01-04 ENCOUNTER — Ambulatory Visit (INDEPENDENT_AMBULATORY_CARE_PROVIDER_SITE_OTHER)
Admission: RE | Admit: 2017-01-04 | Discharge: 2017-01-04 | Disposition: A | Discharge status: Home / Self Care | Payer: Medicare HMO | Source: Ambulatory Visit | Attending: Family | Admitting: Family

## 2017-01-04 ENCOUNTER — Ambulatory Visit (HOSPITAL_COMMUNITY)
Admission: RE | Admit: 2017-01-04 | Discharge: 2017-01-04 | Disposition: A | Discharge status: Home / Self Care | Payer: Medicare HMO | Source: Ambulatory Visit | Attending: Family | Admitting: Family

## 2017-01-04 ENCOUNTER — Ambulatory Visit (INDEPENDENT_AMBULATORY_CARE_PROVIDER_SITE_OTHER): Payer: Medicare HMO | Admitting: Family

## 2017-01-04 VITALS — BP 127/75 | HR 71 | Temp 99.2°F | Resp 18 | Wt 166.6 lb

## 2017-01-04 DIAGNOSIS — I714 Abdominal aortic aneurysm, without rupture, unspecified: Secondary | ICD-10-CM

## 2017-01-04 DIAGNOSIS — I6523 Occlusion and stenosis of bilateral carotid arteries: Secondary | ICD-10-CM | POA: Insufficient documentation

## 2017-01-04 DIAGNOSIS — Z87891 Personal history of nicotine dependence: Secondary | ICD-10-CM

## 2017-01-04 DIAGNOSIS — I771 Stricture of artery: Secondary | ICD-10-CM

## 2017-01-04 LAB — VAS US CAROTID
LCCAPSYS: 93 cm/s
LEFT ECA DIAS: -5 cm/s
LICADDIAS: -26 cm/s
LICAPSYS: -53 cm/s
Left CCA dist dias: 12 cm/s
Left CCA dist sys: 62 cm/s
Left CCA prox dias: 14 cm/s
Left ICA dist sys: -101 cm/s
Left ICA prox dias: -11 cm/s
RCCADSYS: -77 cm/s
RCCAPDIAS: 14 cm/s
RIGHT CCA MID DIAS: 17 cm/s
RIGHT ECA DIAS: -8 cm/s
RIGHT VERTEBRAL DIAS: 6 cm/s
Right CCA prox sys: 105 cm/s

## 2017-01-04 NOTE — Progress Notes (Signed)
[facility] [place] HISTORY AND PHYSICAL   Cc: Follow up AAA and left subclavian artery stenosis   History of Present Illness:   Justin Lyons is a 81 y.o. male whom Dr. Kellie Simmering has been monitoring for known AAA and asymptomatic left subclavian artery stenosis. He returns today for follow up.  The pt denies any history of stroke or TIA, he denies back or abdominal pain, denies tingling, numbness, weakness, cold sensation, or pain in either hand or arm. He denies claudication symptoms with walking. Pt indicates that his PCP heard a carotid bruit which is why he was referred for carotid Duplex monitoring.  He states he is physically active.  Pt Diabetic: No Pt smoker: former smoker, quit in 1989  Pt meds include: Statin :Yes Betablocker: Yes ASA: Yes Other anticoagulants/antiplatelets: Plavix   Current Outpatient Medications  Medication Sig Dispense Refill  . allopurinol (ZYLOPRIM) 300 MG tablet Take 1 tablet (300 mg total) by mouth daily. 90 tablet 3  . aspirin 325 MG tablet Take 325 mg by mouth daily.      . Cholecalciferol (VITAMIN D) 1000 UNITS capsule Take 1,000 Units by mouth daily.      . clopidogrel (PLAVIX) 75 MG tablet Take 1 tablet (75 mg total) by mouth daily. 90 tablet 3  . indomethacin (INDOCIN) 25 MG capsule Take 1 capsule (25 mg total) by mouth 3 (three) times daily with meals. As needed for gout pain 90 capsule 5  . metoprolol (LOPRESSOR) 50 MG tablet Take 1 tablet by mouth  twice a day 180 tablet 3  . nitroGLYCERIN (NITROSTAT) 0.4 MG SL tablet Place 0.4 mg under the tongue every 5 (five) minutes as needed.      . pantoprazole (PROTONIX) 40 MG tablet Take 1 tablet (40 mg total) by mouth daily. 90 tablet 3  . simvastatin (ZOCOR) 40 MG tablet Take 1 tablet (40 mg total) by mouth every evening. 90 tablet 3   No current facility-administered medications for this visit.     Past Medical History:  Diagnosis Date  . AAA (abdominal aortic  aneurysm) ([place])    06/02/2003 old BPG  . Arthritis   . Carotid artery occlusion   . Carotid bruit   . COPD (chronic obstructive pulmonary disease) ([place])   . Coronary artery disease   . Diverticulosis of colon   . Erectile dysfunction   . Hemorrhoids, internal   . History of compression fracture of spine   . History of gout   . Hyperlipidemia   . Hypertension   . Knee pain   . Myocardial infarction ([place]) 2005  . Peripheral vascular disease [facility])     Social History Social History   Tobacco Use  . Smoking status: Former Smoker    Packs/day: 1.50    Years: 20.00    Pack years: 30.00    Types: Cigarettes    Last attempt to quit: 01/22/1982    Years since quitting: 34.9  . Smokeless tobacco: Never Used  Substance Use Topics  . Alcohol use: No  . Drug use: No    Family History Family History  Problem Relation Age of Onset  . Heart disease Father        Heart Disease before age 72  . Heart attack Father   . Cancer Mother        Colon  . Heart disease Mother   . Hyperlipidemia Mother   . Hypertension Mother   . Heart attack Mother   .  Colon cancer Mother   . Cancer Brother        lung cancer    Surgical History Past Surgical History:  Procedure Laterality Date  . CORONARY ANGIOPLASTY  2005    Allergies  Allergen Reactions  . Doxycycline Rash    REACTION: rash  . Penicillins Rash    REACTION: rash  . Sulfonamide Derivatives Rash    REACTION: rash    Current Outpatient Medications  Medication Sig Dispense Refill  . allopurinol (ZYLOPRIM) 300 MG tablet Take 1 tablet (300 mg total) by mouth daily. 90 tablet 3  . aspirin 325 MG tablet Take 325 mg by mouth daily.      . Cholecalciferol (VITAMIN D) 1000 UNITS capsule Take 1,000 Units by mouth daily.      . clopidogrel (PLAVIX) 75 MG tablet Take 1 tablet (75 mg total) by mouth daily. 90 tablet 3  . indomethacin (INDOCIN) 25 MG capsule Take 1 capsule (25 mg total) by mouth 3 (three) times daily with meals. As  needed for gout pain 90 capsule 5  . metoprolol (LOPRESSOR) 50 MG tablet Take 1 tablet by mouth  twice a day 180 tablet 3  . nitroGLYCERIN (NITROSTAT) 0.4 MG SL tablet Place 0.4 mg under the tongue every 5 (five) minutes as needed.      . pantoprazole (PROTONIX) 40 MG tablet Take 1 tablet (40 mg total) by mouth daily. 90 tablet 3  . simvastatin (ZOCOR) 40 MG tablet Take 1 tablet (40 mg total) by mouth every evening. 90 tablet 3   No current facility-administered medications for this visit.      REVIEW OF SYSTEMS: See HPI for pertinent positives and negatives.  Physical Examination Vitals:   01/04/17 0856 01/04/17 0857  BP: 127/69 127/75  Pulse: 71   Resp: 18   Temp: 99.2 F (37.3 C)   TempSrc: Oral   SpO2: 96%   Weight: 166 lb 9.6 oz (75.6 kg)    Body mass index is 23.9 kg/m.  General: WDWN male in NAD Gait: Normal HENT: WNL Eyes: Pupils equal, round, and react to light Pulmonary: normal non-labored breathing, good air movement in all fields, no rales, rhonchi, or wheezing Cardiac: Regular rhythm and rate, no murmur detected  Abdomen: soft, NT, no masses palpated Skin: no rashes, no ulcers, no cellulitis.   VASCULAR EXAM  Carotid Bruits Right Left   Negative Positive   Abdominal aortic pulse is palpable Radial pulses are 2+ palpable and =.   VASCULAR EXAM: Extremitieswithout ischemic changes,  without Gangrene, no open wounds.     LE Pulses Right Left   FEMORAL not palpable faintly palpable    POPLITEAL 2+ palpable  1+ palpable   POSTERIOR TIBIAL not palpable  not palpable    DORSALIS PEDIS  ANTERIOR TIBIAL 2+ palpable  2+ palpable     Musculoskeletal: no muscle wasting or atrophy; no peripheral  edema Neurologic:A&O X 3; appropriate affect,SENSATION: normal; MOTOR FUNCTION: 5/5 Symmetric, CN 2-12 intact, speech is fluent/normal     ASSESSMENT:  Justin Lyons is a 81 y.o. male who has a history of a stable asymptomatic AAA, mild asymptomatic carotid artery stenoses, and left sublcalvian artery stenosis. He has no steal symptoms in either upper extremity.  He denies back or abdominal pain.  He does not have DM and quit smoking in 1989.   DATA   Carotid Duplex (01-04-17): Bilateral ICA stenosis <40%. Right vertebral artery flow is antegrade, right subclavian artery waveforms are normal. Left vertebral  artery flow is retrograde, left subclavian artery waveforms are stenotic.  No significant change in comparison to exams on 12-24-14 and 12-30-15.   AAA Duplex (01-04-17): Patent abdominal aortic aneurysm measuring approximately 4.10 cm at the largest diameter. Right CIA: 1.76 cm; Left CIA: 1.31 cm No significant change compared to 4.12 cm AAA measured on 12-30-15.  Bilateral prominent popliteal pulses: duplex exam on 01-28-15: no aneurysmal dilatation of popliteal arteries.   PLAN:   Based on today's exam and non-invasive vascular lab results, the patient will follow up in 1 year with the following tests: AAA duplex and carotid duplex.  I discussed in depth with the patient the nature of atherosclerosis, and emphasized the importance of maximal medical management including strict control of blood pressure, blood glucose, and lipid levels, obtaining regular exercise, and cessation of smoking.  The patient is aware that without maximal medical management the underlying atherosclerotic disease process will progress, limiting the benefit of any interventions.  Consideration for repair of AAA would be made when the size approaches 4.8 or 5.0 cm, growth > 1 cm/yr, and symptomatic status. The patient was given information about AAA including signs, symptoms, treatment,   what symptoms should prompt the patient to seek immediate medical care, and how to minimize the risk of enlargement and rupture of aneurysms.   The patient was given information about stroke prevention and what symptoms should prompt the patient to seek immediate medical care.  Thank you for allowing [place] to participate in this patient's care.  Clemon Chambers, RN, MSN, FNP-C [facility] Office: [phone]  Clinic MD: Chen/Cain 01/04/2017 9:21 AM

## 2017-01-04 NOTE — Patient Instructions (Addendum)
Before your next abdominal ultrasound:  Take two Extra-Strength Gas-X capsules at bedtime the night before the test. Take another two Extra-Strength Gas-X capsules 3 hours before the test.  Avoid gas forming foods the day before the test.      Abdominal Aortic Aneurysm Blood pumps away from the heart through tubes (blood vessels) called arteries. Aneurysms are weak or damaged places in the wall of an artery. It bulges out like a balloon. An abdominal aortic aneurysm happens in the main artery of the body (aorta). It can burst or tear, causing bleeding inside the body. This is an emergency. It needs treatment right away. What are the causes? The exact cause is unknown. Things that could cause this problem include:  Fat and other substances building up in the lining of a tube.  Swelling of the walls of a blood vessel.  Certain tissue diseases.  Belly (abdominal) trauma.  An infection in the main artery of the body.  What increases the risk? There are things that make it more likely for you to have an aneurysm. These include:  Being over the age of 79 years old.  Having high blood pressure (hypertension).  Being a male.  Being white.  Being very overweight (obese).  Having a family history of aneurysm.  Using tobacco products.  What are the signs or symptoms? Symptoms depend on the size of the aneurysm and how fast it grows. There may not be symptoms. If symptoms occur, they can include:  Pain (belly, side, lower back, or groin).  Feeling full after eating a small amount of food.  Feeling sick to your stomach (nauseous), throwing up (vomiting), or both.  Feeling a lump in your belly that feels like it is beating (pulsating).  Feeling like you will pass out (faint).  How is this treated?  Medicine to control blood pressure and pain.  Imaging tests to see if the aneurysm gets bigger.  Surgery. How is this prevented? To lessen your chance of getting this  condition:  Stop smoking. Stop chewing tobacco.  Limit or avoid alcohol.  Keep your blood pressure, blood sugar, and cholesterol within normal limits.  Eat less salt.  Eat foods low in saturated fats and cholesterol. These are found in animal and whole dairy products.  Eat more fiber. Fiber is found in whole grains, vegetables, and fruits.  Keep a healthy weight.  Stay active and exercise often.  This information is not intended to replace advice given to you by your health care provider. Make sure you discuss any questions you have with your health care provider. Document Released: 05/05/2012 Document Revised: 06/16/2015 Document Reviewed: 02/08/2012 Elsevier Interactive Patient Education  2017 [employer].        Stroke Prevention Some health problems and behaviors may make it more likely for you to have a stroke. Below are ways to lessen your risk of having a stroke.  Be active for at least 30 minutes on most or all days.  Do not smoke. Try not to be around others who smoke.  Do not drink too much alcohol. ? Do not have more than 2 drinks a day if you are a man. ? Do not have more than 1 drink a day if you are a woman and are not pregnant.  Eat healthy foods, such as fruits and vegetables. If you were put on a specific diet, follow the diet as told.  Keep your cholesterol levels under control through diet and medicines. Look for foods that  are low in saturated fat, trans fat, cholesterol, and are high in fiber.  If you have diabetes, follow all diet plans and take your medicine as told.  Ask your doctor if you need treatment to lower your blood pressure. If you have high blood pressure (hypertension), follow all diet plans and take your medicine as told by your doctor.  If you are 78-69 years old, have your blood pressure checked every 3-5 years. If you are age 4 or older, have your blood pressure checked every year.  Keep a healthy weight. Eat foods that are  low in calories, salt, saturated fat, trans fat, and cholesterol.  Do not take drugs.  Avoid birth control pills, if this applies. Talk to your doctor about the risks of taking birth control pills.  Talk to your doctor if you have sleep problems (sleep apnea).  Take all medicine as told by your doctor. ? You may be told to take aspirin or blood thinner medicine. Take this medicine as told by your doctor. ? Understand your medicine instructions.  Make sure any other conditions you have are being taken care of.  Get help right away if:  You suddenly lose feeling (you feel numb) or have weakness in your face, arm, or leg.  Your face or eyelid hangs down to one side.  You suddenly feel confused.  You have trouble talking (aphasia) or understanding what people are saying.  You suddenly have trouble seeing in one or both eyes.  You suddenly have trouble walking.  You are dizzy.  You lose your balance or your movements are clumsy (uncoordinated).  You suddenly have a very bad headache and you do not know the cause.  You have new chest pain.  Your heart feels like it is fluttering or skipping a beat (irregular heartbeat). Do not wait to see if the symptoms above go away. Get help right away. Call your local emergency services (911 in [place] Do not drive yourself to the hospital. This information is not intended to replace advice given to you by your health care provider. Make sure you discuss any questions you have with your health care provider. Document Released: 07/10/2011 Document Revised: 06/16/2015 Document Reviewed: 07/11/2012 Elsevier Interactive Patient Education  [employer].

## 2017-01-12 DIAGNOSIS — J019 Acute sinusitis, unspecified: Secondary | ICD-10-CM | POA: Diagnosis not present

## 2017-01-25 DIAGNOSIS — J441 Chronic obstructive pulmonary disease with (acute) exacerbation: Secondary | ICD-10-CM | POA: Diagnosis not present

## 2017-01-25 DIAGNOSIS — Z6825 Body mass index (BMI) 25.0-25.9, adult: Secondary | ICD-10-CM | POA: Diagnosis not present

## 2017-01-25 DIAGNOSIS — J019 Acute sinusitis, unspecified: Secondary | ICD-10-CM | POA: Diagnosis not present

## 2017-03-18 DIAGNOSIS — H524 Presbyopia: Secondary | ICD-10-CM | POA: Diagnosis not present

## 2017-03-18 DIAGNOSIS — H52223 Regular astigmatism, bilateral: Secondary | ICD-10-CM | POA: Diagnosis not present

## 2017-03-18 DIAGNOSIS — I10 Essential (primary) hypertension: Secondary | ICD-10-CM | POA: Diagnosis not present

## 2017-03-18 DIAGNOSIS — H25813 Combined forms of age-related cataract, bilateral: Secondary | ICD-10-CM | POA: Diagnosis not present

## 2017-03-18 DIAGNOSIS — H5203 Hypermetropia, bilateral: Secondary | ICD-10-CM | POA: Diagnosis not present

## 2017-03-18 DIAGNOSIS — H35033 Hypertensive retinopathy, bilateral: Secondary | ICD-10-CM | POA: Diagnosis not present

## 2017-04-06 ENCOUNTER — Encounter (HOSPITAL_COMMUNITY): Payer: Self-pay

## 2017-04-06 ENCOUNTER — Other Ambulatory Visit: Payer: Self-pay

## 2017-04-06 ENCOUNTER — Emergency Department (HOSPITAL_COMMUNITY): Payer: Medicare HMO

## 2017-04-06 ENCOUNTER — Inpatient Hospital Stay (HOSPITAL_COMMUNITY)
Admission: EM | Admit: 2017-04-06 | Discharge: 2017-04-08 | DRG: 269 | Disposition: A | Discharge status: Home / Self Care | Payer: Medicare HMO | Attending: Surgery | Admitting: Surgery

## 2017-04-06 DIAGNOSIS — Z7902 Long term (current) use of antithrombotics/antiplatelets: Secondary | ICD-10-CM | POA: Diagnosis not present

## 2017-04-06 DIAGNOSIS — I708 Atherosclerosis of other arteries: Secondary | ICD-10-CM | POA: Diagnosis present

## 2017-04-06 DIAGNOSIS — I714 Abdominal aortic aneurysm, without rupture, unspecified: Secondary | ICD-10-CM | POA: Diagnosis present

## 2017-04-06 DIAGNOSIS — J449 Chronic obstructive pulmonary disease, unspecified: Secondary | ICD-10-CM | POA: Diagnosis present

## 2017-04-06 DIAGNOSIS — M545 Low back pain, unspecified: Secondary | ICD-10-CM

## 2017-04-06 DIAGNOSIS — Z8249 Family history of ischemic heart disease and other diseases of the circulatory system: Secondary | ICD-10-CM

## 2017-04-06 DIAGNOSIS — I700 Atherosclerosis of aorta: Secondary | ICD-10-CM | POA: Diagnosis not present

## 2017-04-06 DIAGNOSIS — Z800 Family history of malignant neoplasm of digestive organs: Secondary | ICD-10-CM

## 2017-04-06 DIAGNOSIS — I252 Old myocardial infarction: Secondary | ICD-10-CM | POA: Diagnosis not present

## 2017-04-06 DIAGNOSIS — M109 Gout, unspecified: Secondary | ICD-10-CM | POA: Diagnosis present

## 2017-04-06 DIAGNOSIS — Z8349 Family history of other endocrine, nutritional and metabolic diseases: Secondary | ICD-10-CM

## 2017-04-06 DIAGNOSIS — Z7982 Long term (current) use of aspirin: Secondary | ICD-10-CM | POA: Diagnosis not present

## 2017-04-06 DIAGNOSIS — E785 Hyperlipidemia, unspecified: Secondary | ICD-10-CM | POA: Diagnosis not present

## 2017-04-06 DIAGNOSIS — Z01811 Encounter for preprocedural respiratory examination: Secondary | ICD-10-CM

## 2017-04-06 DIAGNOSIS — Z801 Family history of malignant neoplasm of trachea, bronchus and lung: Secondary | ICD-10-CM | POA: Diagnosis not present

## 2017-04-06 DIAGNOSIS — Z9889 Other specified postprocedural states: Secondary | ICD-10-CM

## 2017-04-06 DIAGNOSIS — Z87891 Personal history of nicotine dependence: Secondary | ICD-10-CM | POA: Diagnosis not present

## 2017-04-06 DIAGNOSIS — I2510 Atherosclerotic heart disease of native coronary artery without angina pectoris: Secondary | ICD-10-CM | POA: Diagnosis present

## 2017-04-06 DIAGNOSIS — Z0131 Encounter for examination of blood pressure with abnormal findings: Secondary | ICD-10-CM | POA: Diagnosis not present

## 2017-04-06 DIAGNOSIS — Z9861 Coronary angioplasty status: Secondary | ICD-10-CM | POA: Diagnosis not present

## 2017-04-06 DIAGNOSIS — Z01818 Encounter for other preprocedural examination: Secondary | ICD-10-CM | POA: Diagnosis not present

## 2017-04-06 DIAGNOSIS — E7800 Pure hypercholesterolemia, unspecified: Secondary | ICD-10-CM | POA: Diagnosis present

## 2017-04-06 DIAGNOSIS — Z8679 Personal history of other diseases of the circulatory system: Secondary | ICD-10-CM

## 2017-04-06 DIAGNOSIS — I6523 Occlusion and stenosis of bilateral carotid arteries: Secondary | ICD-10-CM | POA: Diagnosis not present

## 2017-04-06 DIAGNOSIS — R109 Unspecified abdominal pain: Secondary | ICD-10-CM | POA: Diagnosis not present

## 2017-04-06 DIAGNOSIS — I10 Essential (primary) hypertension: Secondary | ICD-10-CM | POA: Diagnosis not present

## 2017-04-06 DIAGNOSIS — J9811 Atelectasis: Secondary | ICD-10-CM | POA: Diagnosis not present

## 2017-04-06 DIAGNOSIS — J439 Emphysema, unspecified: Secondary | ICD-10-CM | POA: Diagnosis not present

## 2017-04-06 LAB — I-STAT CHEM 8, ED
BUN: 13 mg/dL (ref 6–20)
CALCIUM ION: 1.16 mmol/L (ref 1.15–1.40)
Chloride: 102 mmol/L (ref 101–111)
Creatinine, Ser: 1 mg/dL (ref 0.61–1.24)
Glucose, Bld: 104 mg/dL — ABNORMAL HIGH (ref 65–99)
HCT: 46 % (ref 39.0–52.0)
Hemoglobin: 15.6 g/dL (ref 13.0–17.0)
Potassium: 4.2 mmol/L (ref 3.5–5.1)
SODIUM: 140 mmol/L (ref 135–145)
TCO2: 24 mmol/L (ref 22–32)

## 2017-04-06 LAB — CBC WITH DIFFERENTIAL/PLATELET
Basophils Absolute: 0 10*3/uL (ref 0.0–0.1)
Basophils Relative: 0 %
Eosinophils Absolute: 0.3 10*3/uL (ref 0.0–0.7)
Eosinophils Relative: 3 %
HCT: 44.1 % (ref 39.0–52.0)
HEMOGLOBIN: 14.7 g/dL (ref 13.0–17.0)
LYMPHS ABS: 2 10*3/uL (ref 0.7–4.0)
LYMPHS PCT: 19 %
MCH: 31.7 pg (ref 26.0–34.0)
MCHC: 33.3 g/dL (ref 30.0–36.0)
MCV: 95.2 fL (ref 78.0–100.0)
Monocytes Absolute: 0.5 10*3/uL (ref 0.1–1.0)
Monocytes Relative: 5 %
NEUTROS PCT: 73 %
Neutro Abs: 7.6 10*3/uL (ref 1.7–7.7)
Platelets: 212 10*3/uL (ref 150–400)
RBC: 4.63 MIL/uL (ref 4.22–5.81)
RDW: 13.7 % (ref 11.5–15.5)
WBC: 10.3 10*3/uL (ref 4.0–10.5)

## 2017-04-06 LAB — COMPREHENSIVE METABOLIC PANEL
ALK PHOS: 70 U/L (ref 38–126)
ALT: 23 U/L (ref 17–63)
AST: 24 U/L (ref 15–41)
Albumin: 3.8 g/dL (ref 3.5–5.0)
Anion gap: 11 (ref 5–15)
BILIRUBIN TOTAL: 0.8 mg/dL (ref 0.3–1.2)
BUN: 11 mg/dL (ref 6–20)
CALCIUM: 9.1 mg/dL (ref 8.9–10.3)
CO2: 23 mmol/L (ref 22–32)
CREATININE: 1.01 mg/dL (ref 0.61–1.24)
Chloride: 101 mmol/L (ref 101–111)
Glucose, Bld: 108 mg/dL — ABNORMAL HIGH (ref 65–99)
Potassium: 4.2 mmol/L (ref 3.5–5.1)
Sodium: 135 mmol/L (ref 135–145)
Total Protein: 7.6 g/dL (ref 6.5–8.1)

## 2017-04-06 LAB — LIPASE, BLOOD: LIPASE: 25 U/L (ref 11–51)

## 2017-04-06 LAB — I-STAT TROPONIN, ED: TROPONIN I, POC: 0.01 ng/mL (ref 0.00–0.08)

## 2017-04-06 LAB — PROTIME-INR
INR: 1.04
Prothrombin Time: 13.5 seconds (ref 11.4–15.2)

## 2017-04-06 MED ORDER — LABETALOL HCL 5 MG/ML IV SOLN: 10.0000 mg | Freq: Once | INTRAVENOUS | Status: DC

## 2017-04-06 MED ORDER — IOPAMIDOL (ISOVUE-370) INJECTION 76%
INTRAVENOUS | Status: AC
  Administered 2017-04-06: 100 mL
  Filled 2017-04-06: qty 100

## 2017-04-06 NOTE — ED Triage Notes (Addendum)
Pt presents to the ed with complaints of sudden back pain x 3 days.  The patient has an abdominal aortic aneurysm that he has followed every year and has been stable at "4 cm".  He went to urgent care today and the provider there states on the x ray it appears much larger than that and stated he needed to come straight to the ED. Pt is alert, oriented and ambulatory in no apparent distress.

## 2017-04-06 NOTE — ED Notes (Signed)
Pt to xray will wait until the pt returns from xray to give labetalol okd with dr tegeler

## 2017-04-06 NOTE — ED Notes (Signed)
Pt returned from c-t  bp down med not given  Dr tegeler aware

## 2017-04-06 NOTE — ED Notes (Signed)
Vascular team in the department

## 2017-04-06 NOTE — H&P (Signed)
[facility] [place]  Patient name: Justin Lyons MRN: [id number] DOB: [birth year] Sex: male   REQUESTING PROVIDER:    ER   REASON FOR CONSULT:    AAA  HISTORY OF PRESENT ILLNESS:   Justin Lyons is a 82 y.o. male, who is well known to our practice, having been followed for an abdominal aortic aneurysm.  He was most recently seen in December 2018 by our nurse practitioner.  He had an ultrasound which revealed a 4.1 cm aneurysm.  He came into the emergency department because of right-sided flank pain.  He had a CT scan that showed a 5.4 cm aneurysm.  He is not having any pain currently.  He does not endorse chest pain or shortness of breath.  Patient has a known left subclavian artery stenosis.  He suffers from COPD from chronic tobacco abuse having quit smoking in 1989.  He takes a statin for hypercholesterolemia.  He is on dual antiplatelet therapy with aspirin and Plavix.  His blood pressure is medically managed.  His most recent carotid duplex showed less than 40% stenosis bilaterally.  PAST MEDICAL HISTORY    Past Medical History:  Diagnosis Date  . AAA (abdominal aortic aneurysm) ([place])    06/02/2003 old BPG  . Arthritis   . Carotid artery occlusion   . Carotid bruit   . COPD (chronic obstructive pulmonary disease) ([place])   . Coronary artery disease   . Diverticulosis of colon   . Erectile dysfunction   . Hemorrhoids, internal   . History of compression fracture of spine   . History of gout   . Hyperlipidemia   . Hypertension   . Knee pain   . Myocardial infarction ([place]) 2005  . Peripheral vascular disease ([place])      FAMILY HISTORY   Family History  Problem Relation Age of Onset  . Heart disease Father        Heart Disease before age 14  . Heart attack Father   . Cancer Mother        Colon  . Heart disease Mother   . Hyperlipidemia Mother   . Hypertension Mother   . Heart attack Mother   . Colon cancer  Mother   . Cancer Brother        lung cancer    SOCIAL HISTORY:   Social History   Socioeconomic History  . Marital status: Single    Spouse name: Not on file  . Number of children: Not on file  . Years of education: Not on file  . Highest education level: Not on file  Social Needs  . Financial resource strain: Not on file  . Food insecurity - worry: Not on file  . Food insecurity - inability: Not on file  . Transportation needs - medical: Not on file  . Transportation needs - non-medical: Not on file  Occupational History  . Occupation: part time in a pharmacy    Employer: RETIRED  Tobacco Use  . Smoking status: Former Smoker    Packs/day: 1.50    Years: 20.00    Pack years: 30.00    Types: Cigarettes    Last attempt to quit: 01/22/1982    Years since quitting: 35.2  . Smokeless tobacco: Never Used  Substance and Sexual Activity  . Alcohol use: No  . Drug use: No  . Sexual activity: Not on file  Other Topics Concern  . Not on file  Social History Narrative  .  Not on file    ALLERGIES:    Allergies  Allergen Reactions  . Doxycycline Rash  . Penicillins Rash  . Sulfonamide Derivatives Rash    CURRENT MEDICATIONS:    Current Facility-Administered Medications  Medication Dose Route Frequency Provider Last Rate Last Dose  . labetalol (NORMODYNE,TRANDATE) injection 10 mg  10 mg Intravenous Once Tegeler, Gwenyth Allegra, MD   Stopped at 04/06/17 2203   Current Outpatient Medications  Medication Sig Dispense Refill  . allopurinol (ZYLOPRIM) 300 MG tablet Take 1 tablet (300 mg total) by mouth daily. 90 tablet 3  . aspirin 81 MG tablet Take 81 mg by mouth daily.     . Cholecalciferol (VITAMIN D) 1000 UNITS capsule Take 1,000 Units by mouth daily.      . clopidogrel (PLAVIX) 75 MG tablet Take 1 tablet (75 mg total) by mouth daily. 90 tablet 3  . indomethacin (INDOCIN) 25 MG capsule Take 1 capsule (25 mg total) by mouth 3 (three) times daily with meals. As needed  for gout pain 90 capsule 5  . metoprolol (LOPRESSOR) 50 MG tablet Take 1 tablet by mouth  twice a day 180 tablet 3  . nitroGLYCERIN (NITROSTAT) 0.4 MG SL tablet Place 0.4 mg under the tongue every 5 (five) minutes as needed.      . pantoprazole (PROTONIX) 40 MG tablet Take 1 tablet (40 mg total) by mouth daily. 90 tablet 3  . simvastatin (ZOCOR) 40 MG tablet Take 1 tablet (40 mg total) by mouth every evening. 90 tablet 3    REVIEW OF SYSTEMS:   [X]  denotes positive finding, [ ]  denotes negative finding Cardiac  Comments:  Chest pain or chest pressure:    Shortness of breath upon exertion:    Short of breath when lying flat:    Irregular heart rhythm:        Vascular    Pain in calf, thigh, or hip brought on by ambulation:    Pain in feet at night that wakes you up from your sleep:     Blood clot in your veins:    Leg swelling:         Pulmonary    Oxygen at home:    Productive cough:     Wheezing:         Neurologic    Sudden weakness in arms or legs:     Sudden numbness in arms or legs:     Sudden onset of difficulty speaking or slurred speech:    Temporary loss of vision in one eye:     Problems with dizziness:         Gastrointestinal    Blood in stool:      Vomited blood:         Genitourinary    Burning when urinating:     Blood in urine:        Psychiatric    Major depression:         Hematologic    Bleeding problems:    Problems with blood clotting too easily:        Skin    Rashes or ulcers:        Constitutional    Fever or chills:     PHYSICAL EXAM:   Vitals:   04/06/17 2046 04/06/17 2100 04/06/17 2130 04/06/17 2200  BP: (!) 147/74 (!) 150/69 (!) 159/68 (!) 166/74  Pulse: 76 75 74 79  Resp: (!) 21 (!) 23 16 19   Temp:  SpO2: 93% 95% 96% 95%  Weight:        GENERAL: The patient is a well-nourished male, in no acute distress. The vital signs are documented above. CARDIAC: There is a regular rate and rhythm.  VASCULAR: Palpable pedal  pulses PULMONARY: Nonlabored respirations ABDOMEN: His abdominal aneurysm is easily palpable and nontender.  MUSCULOSKELETAL: There are no major deformities or cyanosis. NEUROLOGIC: No focal weakness or paresthesias are detected. SKIN: There are no ulcers or rashes noted. PSYCHIATRIC: The patient has a normal affect.  STUDIES:   I have reviewed the CTA with the following findings:  1. Infrarenal 5.4 cm abdominal aortic aneurysm, increased. Increasingly irregular outer aneurysm sac contour. New eccentric ill-defined fluid and haziness of the para-aortic fat. These findings are indicative of an unstable aneurysm with potential imminent rupture. 2. Aortic Atherosclerosis (ICD10-I70.0) and Emphysema (ICD10-J43.9). 3. Three-vessel coronary atherosclerosis. 4. 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 5. 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 6. Apical left upper lobe 6 mm solid pulmonary nodule, new since 2005 CT. Non-contrast chest CT at 6-12 months is recommended. If the nodule is stable at time of repeat CT, then future CT at 18-24 months (from today's scan) is considered optional for low-risk patients, but is recommended for high-risk patients. This recommendation follows the consensus statement: Guidelines for Management of Incidental Pulmonary Nodules Detected on CT Images:From the Fleischner Society 2017; published online before print (10.1148/radiol.[id number]). 7. Several additional chronic findings as above.  ASSESSMENT and PLAN   AAA: I discussed the CT scan findings with the patient and his wife.  I do not feel that he is currently asymptomatic however when comparing his CT scan today from his ultrasound in December, there has been a significant increase in the size of his aneurysm.  Radiologist comments that the aneurysm is at risk for rupture.  Because of the size change and CT scan findings, I have recommended that we proceed  with endovascular repair tomorrow morning.  I discussed the risks and benefits of the procedure with the patient and his wife which include but are not limited to the risk of bleeding, stroke, intestinal complications, lower extremity ischemia, and death.  He understands these risks and wishes to proceed.  He will be n.p.o. after midnight   Annamarie Major, MD [facility] [phone] Pager [phone]

## 2017-04-06 NOTE — ED Notes (Signed)
Food given before midnight

## 2017-04-06 NOTE — ED Provider Notes (Signed)
[facility] EMERGENCY DEPARTMENT Provider Note   CSN: [id number] Arrival date & time: 04/06/17  [place]     History   Chief Complaint Chief Complaint  Patient presents with  . Back Pain    HPI Justin Lyons is a 82 y.o. male.  The history is provided by the patient, the spouse and medical records. No language interpreter was used.  Back Pain   This is a new problem. The current episode started more than 2 days ago. The problem occurs constantly. The problem has not changed since onset.The pain is associated with no known injury. The pain is present in the lumbar spine and thoracic spine. The quality of the pain is described as aching. The pain does not radiate. The pain is at a severity of 6/10. The pain is moderate. The pain is the same all the time. Pertinent negatives include no chest pain, no fever, no numbness, no headaches, no abdominal pain, no bowel incontinence, no perianal numbness, no dysuria, no paresis and no weakness. He has tried nothing for the symptoms. The treatment provided no relief.    Past Medical History:  Diagnosis Date  . AAA (abdominal aortic aneurysm) ([place])    06/02/2003 old BPG  . Arthritis   . Carotid artery occlusion   . Carotid bruit   . COPD (chronic obstructive pulmonary disease) ([place])   . Coronary artery disease   . Diverticulosis of colon   . Erectile dysfunction   . Hemorrhoids, internal   . History of compression fracture of spine   . History of gout   . Hyperlipidemia   . Hypertension   . Knee pain   . Myocardial infarction ([place]) 2005  . Peripheral vascular disease [facility])     Patient Active Problem List   Diagnosis Date Noted  . Prominent popliteal pulse 01/28/2015  . Family history of malignant neoplasm of gastrointestinal tract 12/29/2013  . Abdominal aneurysm without mention of rupture 12/05/2011  . Acute bronchitis 07/12/2010  . KNEE PAIN 05/31/2009  . CAROTID BRUIT 11/02/2007  . ERECTILE DYSFUNCTION  05/02/2007  . COPD 05/02/2007  . GOUT 04/29/2007  . DIVERTICULOSIS OF COLON 04/29/2007  . COMPRESSION FRACTURE 04/29/2007  . HYPERLIPIDEMIA 11/05/2006  . HYPERTENSION 11/05/2006  . CORONARY ARTERY DISEASE 11/05/2006  . PERIPHERAL VASCULAR DISEASE 11/05/2006  . HEMORRHOIDS, INTERNAL 11/05/2006    Past Surgical History:  Procedure Laterality Date  . CORONARY ANGIOPLASTY  2005       Home Medications    Prior to Admission medications   Medication Sig Start Date End Date Taking? Authorizing Provider  allopurinol (ZYLOPRIM) 300 MG tablet Take 1 tablet (300 mg total) by mouth daily. 03/24/13   Noralee Space, MD  aspirin 325 MG tablet Take 325 mg by mouth daily.      [provider]  Cholecalciferol (VITAMIN D) 1000 UNITS capsule Take 1,000 Units by mouth daily.      [provider]  clopidogrel (PLAVIX) 75 MG tablet Take 1 tablet (75 mg total) by mouth daily. 03/24/13   Noralee Space, MD  indomethacin (INDOCIN) 25 MG capsule Take 1 capsule (25 mg total) by mouth 3 (three) times daily with meals. As needed for gout pain 03/21/12   Noralee Space, MD  metoprolol (LOPRESSOR) 50 MG tablet Take 1 tablet by mouth  twice a day 03/16/13   Noralee Space, MD  nitroGLYCERIN (NITROSTAT) 0.4 MG SL tablet Place 0.4 mg under the tongue every 5 (five) minutes as  needed.      [provider]  pantoprazole (PROTONIX) 40 MG tablet Take 1 tablet (40 mg total) by mouth daily. 03/24/13   Noralee Space, MD  simvastatin (ZOCOR) 40 MG tablet Take 1 tablet (40 mg total) by mouth every evening. 03/24/13 01/28/15  Noralee Space, MD    Family History Family History  Problem Relation Age of Onset  . Heart disease Father        Heart Disease before age 69  . Heart attack Father   . Cancer Mother        Colon  . Heart disease Mother   . Hyperlipidemia Mother   . Hypertension Mother   . Heart attack Mother   . Colon cancer Mother   . Cancer Brother        lung cancer    Social  History Social History   Tobacco Use  . Smoking status: Former Smoker    Packs/day: 1.50    Years: 20.00    Pack years: 30.00    Types: Cigarettes    Last attempt to quit: 01/22/1982    Years since quitting: 35.2  . Smokeless tobacco: Never Used  Substance Use Topics  . Alcohol use: No  . Drug use: No     Allergies   Doxycycline; Penicillins; and Sulfonamide derivatives   Review of Systems Review of Systems  Constitutional: Negative for chills, diaphoresis, fatigue and fever.  HENT: Negative for congestion.   Respiratory: Negative for cough, chest tightness, shortness of breath, wheezing and stridor.   Cardiovascular: Negative for chest pain.  Gastrointestinal: Negative for abdominal pain, bowel incontinence, constipation, diarrhea, nausea and vomiting.  Genitourinary: Negative for dysuria and flank pain.  Musculoskeletal: Positive for back pain. Negative for neck pain and neck stiffness.  Skin: Negative for rash and wound.  Neurological: Negative for weakness, light-headedness, numbness and headaches.  Psychiatric/Behavioral: Negative for agitation.  All other systems reviewed and are negative.    Physical Exam Updated Vital Signs BP (!) 186/79   Pulse 78   Temp 97.9 F (36.6 C)   Resp 18   Wt 75.3 kg (166 lb)   SpO2 98%   BMI 23.82 kg/m   Physical Exam  Constitutional: He is oriented to person, place, and time. He appears well-developed and well-nourished. No distress.  HENT:  Head: Normocephalic and atraumatic.  Mouth/Throat: Oropharynx is clear and moist. No oropharyngeal exudate.  Eyes: EOM are normal. Pupils are equal, round, and reactive to light.  Cardiovascular: Normal rate and intact distal pulses.  No murmur heard. Pulmonary/Chest: Effort normal and breath sounds normal. No stridor. No respiratory distress. He exhibits no tenderness.  Abdominal: Soft. He exhibits no distension. There is no tenderness. There is no rebound.  Musculoskeletal: He  exhibits no edema or tenderness.  Neurological: He is alert and oriented to person, place, and time. No sensory deficit. He exhibits normal muscle tone.  Skin: Skin is warm. Capillary refill takes less than 2 seconds. He is not diaphoretic. No erythema. No pallor.  Psychiatric: He has a normal mood and affect.  Nursing note and vitals reviewed.    ED Treatments / Results  Labs (all labs ordered are listed, but only abnormal results are displayed) Labs Reviewed  COMPREHENSIVE METABOLIC PANEL - Abnormal; Notable for the following components:      Result Value   Glucose, Bld 108 (*)    All other components within normal limits  I-STAT CHEM 8, ED - Abnormal; Notable for the  following components:   Glucose, Bld 104 (*)    All other components within normal limits  CBC WITH DIFFERENTIAL/PLATELET  LIPASE, BLOOD  PROTIME-INR  URINALYSIS, ROUTINE W REFLEX MICROSCOPIC  I-STAT TROPONIN, ED    EKG  EKG Interpretation None       Radiology Ct Angio Chest/abd/pel For Dissection W And/or Wo Contrast  Result Date: 04/06/2017 CLINICAL DATA:  Acute back pain.  History of AAA. EXAM: CT ANGIOGRAPHY CHEST, ABDOMEN AND PELVIS TECHNIQUE: Multidetector CT imaging through the chest, abdomen and pelvis was performed using the standard protocol during bolus administration of intravenous contrast. Multiplanar reconstructed images and MIPs were obtained and reviewed to evaluate the vascular anatomy. CONTRAST:  181mL ISOVUE-370 IOPAMIDOL (ISOVUE-370) INJECTION 76% COMPARISON:  12/07/2003 CT chest and abdomen. FINDINGS: CTA CHEST FINDINGS Cardiovascular: Normal heart size. No significant pericardial fluid/thickening. Three-vessel coronary atherosclerosis. Atherosclerotic nonaneurysmal thoracic aorta with extensive ulcerated plaque throughout descending thoracic aorta. No acute intramural hematoma, dissection, pseudoaneurysm or penetrating atherosclerotic ulcer in the thoracic aorta. High-grade stenosis of  proximal left subclavian artery (series 7/image 33), probably unchanged since 2005. Otherwise patent atherosclerotic aortic arch branch vessels. Normal caliber pulmonary arteries. No central pulmonary emboli. Mediastinum/Nodes: No discrete thyroid nodules. Unremarkable esophagus. No pathologically enlarged axillary, mediastinal or hilar lymph nodes. Lungs/Pleura: No pneumothorax. No pleural effusion. Moderate to severe centrilobular emphysema with mild diffuse bronchial wall thickening. Apical left upper lobe 6 mm solid pulmonary nodule (series 11/image 19), new since 2005 CT. No acute consolidative airspace disease, lung masses or additional significant pulmonary nodules. Musculoskeletal: No aggressive appearing focal osseous lesions. Chronic severe T8 vertebral compression fracture with associated focal kyphotic angulation, unchanged since 09/19/2012 lateral chest radiograph. Marked thoracic spondylosis. Review of the MIP images confirms the above findings. CTA ABDOMEN AND PELVIS FINDINGS VASCULAR Aorta: Markedly atherosclerotic abdominal aorta. Infrarenal 5.4 cm abdominal aortic aneurysm, increased from 4.1 cm on 12/07/2003 CT. Increasingly irregular outer contour of the aneurysm sac. New eccentric ill-defined fluid and haziness of para-aortic fat anteriorly (series 7/image 197). Celiac: Moderate (50%) ostial stenosis. SMA: Patent without evidence of aneurysm, dissection, vasculitis or significant stenosis. Renals: Both single renal arteries are patent without evidence of aneurysm, dissection, vasculitis, fibromuscular dysplasia or significant stenosis. IMA: Probably occluded proximally with distal reconstitution. Inflow: Markedly atherosclerotic common iliac arteries without high-grade stenosis. Ectatic 1.6 cm right common iliac artery. Ectatic 1.5 cm left common iliac artery. Veins: Narrow caliber of the right external iliac vein with central calcification, suggesting chronic thrombosis. Review of the MIP  images confirms the above findings. NON-VASCULAR Hepatobiliary: Normal liver with no liver mass. Normal gallbladder with no radiopaque cholelithiasis. No biliary ductal dilatation. Pancreas: Normal, with no mass or duct dilation. Spleen: Normal size. No mass. Adrenals/Urinary Tract: Normal adrenals. Simple bilateral renal cysts, largest 2.4 cm in the upper right kidney. No hydronephrosis. Normal bladder. Stomach/Bowel: Small hiatal hernia. Otherwise normal nondistended stomach. Normal caliber small bowel with no small bowel wall thickening. Normal appendix. Moderate diffuse colonic diverticulosis, with no large bowel wall thickening or pericolonic fat stranding. Vascular/Lymphatic: See above for vascular findings. No pathologically enlarged lymph nodes in the abdomen or pelvis. Reproductive: Top-normal size prostate. Nonspecific punctate internal prostatic calcifications. Other: No pneumoperitoneum, ascites or focal fluid collection. Small fat containing left inguinal hernia. Musculoskeletal: No aggressive appearing focal osseous lesions. Moderate lumbar spondylosis. Review of the MIP images confirms the above findings. IMPRESSION: 1. Infrarenal 5.4 cm abdominal aortic aneurysm, increased. Increasingly irregular outer aneurysm sac contour. New eccentric ill-defined fluid and haziness of the  para-aortic fat. These findings are indicative of an unstable aneurysm with potential imminent rupture. 2. Aortic Atherosclerosis (ICD10-I70.0) and Emphysema (ICD10-J43.9). 3. Three-vessel coronary atherosclerosis. 4. 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 5. 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 6. Apical left upper lobe 6 mm solid pulmonary nodule, new since 2005 CT. Non-contrast chest CT at 6-12 months is recommended. If the nodule is stable at time of repeat CT, then future CT at 18-24 months (from today's scan) is considered optional for low-risk patients, but is recommended  for high-risk patients. This recommendation follows the consensus statement: Guidelines for Management of Incidental Pulmonary Nodules Detected on CT Images:From the Fleischner Society 2017; published online before print (10.1148/radiol.[id number]). 7. Several additional chronic findings as above. These results were called by telephone at the time of interpretation on 04/06/2017 at 9:14 pm to Dr. Marda Stalker , who verbally acknowledged these results. Electronically Signed   By: Ilona Sorrel M.D.   On: 04/06/2017 21:16    Procedures Procedures (including critical care time)  Medications Ordered in ED Medications  labetalol (NORMODYNE,TRANDATE) injection 10 mg (0 mg Intravenous Hold 04/06/17 2203)  iopamidol (ISOVUE-370) 76 % injection (100 mLs  Contrast Given 04/06/17 2011)     Initial Impression / Assessment and Plan / ED Course  I have reviewed the triage vital signs and the nursing notes.  Pertinent labs & imaging results that were available during my care of the patient were reviewed by me and considered in my medical decision making (see chart for details).     Justin Lyons is a 82 y.o. male with a past medical history significant for known AAA, hypertension, COPD, CAD, hyperlipidemia, and PVD who presents from urgent care for back pain concerning for aortic etiology.  Patient says that for the last few days he has had moderate to severe back pain.  It is aching and does not seem to be related to moving twisting or walking.  He says that he did help lift some heavy vinyl several days ago but did not have onset of pain related to this.  He reports that the pain began suddenly and is located across his mid back.  He denies any abdominal pain, chest pain, lightheadedness.  He denies any shortness of breath, nausea, vomiting.  He denies any urinary symptoms or GI symptoms.  He denies leg pain, leg swelling, leg tingling, or color change in his legs.  He reports that he went to an urgent  care where a KUB was performed showing concern for large AAA that was larger than the reported 4 cm that patient says it was back in December.  On exam, patient is  Hypertensive with a blood pressure around 962 systolic.  Patient is continued to have back pain.  Abdomen is nontender.  Lungs are clear and chest is nontender.  Patient has bilateral 2+ DP pulses and has normal sensation and strength in legs.  No discoloration seen.  No pulsatile mass palpated in abdomen.  Next  Bedside ultrasound was attempted however due to bowel gas good views of the aorta were not seen.  Clinically given the report of large AAA on x-ray and the patient's back pain, in the setting of his elevated blood pressure, patient will have CT imaging to look for dissection or worsened aneurysm.  Patient will be given IV blood pressure medicine and have screening laboratory testing in the event he needs to go to the operating room.  Anticipate reassessment after  CT imaging and lab work.  9:16 PM Radiology was called to report the patient's CT scan showed the aneurysm is now 5.4 cm and appears hazy around it with some fluid.  They are concerned it is unstable at this time.  They recommended speaking with vascular surgery although they do not feel it has already ruptured.  Vascular surgery was called.  They came to see the patient and will admit for further management. They suspect that they will need to repair his AAA.    Patient's blood pressure improved without having to receive the blood pressure medications however will keep a close eye on his blood pressure given the expanding AAA.    Patient admitted to vascular surgery.   Final Clinical Impressions(s) / ED Diagnoses   Final diagnoses:  Low back pain without sciatica, unspecified back pain laterality, unspecified chronicity  Abdominal aortic aneurysm (AAA) without rupture ([facility])     Clinical Impression: 1. Low back pain without sciatica, unspecified back pain  laterality, unspecified chronicity   2. Abdominal aortic aneurysm (AAA) without rupture ([place])     Disposition: Admit  This note was prepared with assistance of Dragon voice recognition software. Occasional wrong-word or sound-a-like substitutions may have occurred due to the inherent limitations of voice recognition software.      Tegeler, Gwenyth Allegra, MD 04/07/17 [phone]

## 2017-04-07 ENCOUNTER — Inpatient Hospital Stay (HOSPITAL_COMMUNITY): Payer: Medicare HMO

## 2017-04-07 ENCOUNTER — Inpatient Hospital Stay (HOSPITAL_COMMUNITY): Payer: Medicare HMO | Admitting: Certified Registered Nurse Anesthetist

## 2017-04-07 ENCOUNTER — Encounter (HOSPITAL_COMMUNITY)
Admission: EM | Disposition: A | Discharge status: Home / Self Care | Payer: Self-pay | Source: Home / Self Care | Attending: Surgery

## 2017-04-07 ENCOUNTER — Encounter (HOSPITAL_COMMUNITY): Payer: Self-pay | Admitting: Certified Registered Nurse Anesthetist

## 2017-04-07 DIAGNOSIS — I714 Abdominal aortic aneurysm, without rupture: Secondary | ICD-10-CM

## 2017-04-07 HISTORY — PX: ABDOMINAL AORTIC ENDOVASCULAR STENT GRAFT: SHX5707

## 2017-04-07 LAB — COMPREHENSIVE METABOLIC PANEL
ALBUMIN: 3.5 g/dL (ref 3.5–5.0)
ALK PHOS: 64 U/L (ref 38–126)
ALT: 21 U/L (ref 17–63)
ANION GAP: 11 (ref 5–15)
AST: 24 U/L (ref 15–41)
BUN: 10 mg/dL (ref 6–20)
CALCIUM: 9 mg/dL (ref 8.9–10.3)
CO2: 24 mmol/L (ref 22–32)
Chloride: 101 mmol/L (ref 101–111)
Creatinine, Ser: 1.03 mg/dL (ref 0.61–1.24)
GFR calc non Af Amer: >60 mL/min (ref >60)
Glucose, Bld: 163 mg/dL — ABNORMAL HIGH (ref 65–99)
POTASSIUM: 3.6 mmol/L (ref 3.5–5.1)
Sodium: 136 mmol/L (ref 135–145)
TOTAL PROTEIN: 6.7 g/dL (ref 6.5–8.1)
Total Bilirubin: 1 mg/dL (ref 0.3–1.2)

## 2017-04-07 LAB — CBC
HCT: 42.1 % (ref 39.0–52.0)
HEMATOCRIT: 40.3 % (ref 39.0–52.0)
HEMOGLOBIN: 13.6 g/dL (ref 13.0–17.0)
Hemoglobin: 13.5 g/dL (ref 13.0–17.0)
MCH: 30.8 pg (ref 26.0–34.0)
MCH: 31.8 pg (ref 26.0–34.0)
MCHC: 32.3 g/dL (ref 30.0–36.0)
MCHC: 33.5 g/dL (ref 30.0–36.0)
MCV: 95.5 fL (ref 78.0–100.0)
MCV: 94.8 fL (ref 78.0–100.0)
Platelets: 187 10*3/uL (ref 150–400)
Platelets: 193 10*3/uL (ref 150–400)
RBC: 4.25 MIL/uL (ref 4.22–5.81)
RBC: 4.41 MIL/uL (ref 4.22–5.81)
RDW: 13.5 % (ref 11.5–15.5)
RDW: 13.6 % (ref 11.5–15.5)
WBC: 8.3 10*3/uL (ref 4.0–10.5)
WBC: 9.1 10*3/uL (ref 4.0–10.5)

## 2017-04-07 LAB — BASIC METABOLIC PANEL
ANION GAP: 9 (ref 5–15)
BUN: 10 mg/dL (ref 6–20)
CO2: 25 mmol/L (ref 22–32)
Calcium: 9 mg/dL (ref 8.9–10.3)
Chloride: 105 mmol/L (ref 101–111)
Creatinine, Ser: 0.96 mg/dL (ref 0.61–1.24)
GLUCOSE: 89 mg/dL (ref 65–99)
POTASSIUM: 3.5 mmol/L (ref 3.5–5.1)
Sodium: 139 mmol/L (ref 135–145)

## 2017-04-07 LAB — MRSA PCR SCREENING: MRSA by PCR: NEGATIVE

## 2017-04-07 LAB — URINALYSIS, COMPLETE (UACMP) WITH MICROSCOPIC
Bacteria, UA: NONE SEEN
Bilirubin Urine: NEGATIVE
GLUCOSE, UA: NEGATIVE mg/dL
Ketones, ur: 5 mg/dL — AB
Leukocytes, UA: NEGATIVE
NITRITE: NEGATIVE
PROTEIN: NEGATIVE mg/dL
Squamous Epithelial / LPF: NONE SEEN
pH: 6 (ref 5.0–8.0)

## 2017-04-07 LAB — TYPE AND SCREEN
ABO/RH(D): A POS
Antibody Screen: NEGATIVE

## 2017-04-07 LAB — PROTIME-INR
INR: 1.04
INR: 1.07
Prothrombin Time: 13.8 seconds (ref 11.4–15.2)
Prothrombin Time: 13.5 seconds (ref 11.4–15.2)

## 2017-04-07 LAB — ABO/RH: ABO/RH(D): A POS

## 2017-04-07 LAB — POCT ACTIVATED CLOTTING TIME: ACTIVATED CLOTTING TIME: 329 s

## 2017-04-07 SURGERY — INSERTION, ENDOVASCULAR STENT GRAFT, AORTA, ABDOMINAL
Anesthesia: General

## 2017-04-07 MED ORDER — SUGAMMADEX SODIUM 200 MG/2ML IV SOLN
INTRAVENOUS | Status: DC | PRN
  Administered 2017-04-07: 200 mg via INTRAVENOUS

## 2017-04-07 MED ORDER — FENTANYL CITRATE (PF) 100 MCG/2ML IJ SOLN
25.0000 ug | INTRAMUSCULAR | Status: DC | PRN
  Administered 2017-04-07: 25 ug via INTRAVENOUS
  Administered 2017-04-07: 50 ug via INTRAVENOUS
  Administered 2017-04-07: 25 ug via INTRAVENOUS

## 2017-04-07 MED ORDER — ASPIRIN EC 81 MG PO TBEC
81.0000 mg | DELAYED_RELEASE_TABLET | Freq: Every day | ORAL | Status: DC
  Administered 2017-04-08: 81 mg via ORAL
  Filled 2017-04-07: qty 1

## 2017-04-07 MED ORDER — SODIUM CHLORIDE 0.9 % IV SOLN
INTRAVENOUS | Status: DC | PRN
  Administered 2017-04-07: 1000 mL

## 2017-04-07 MED ORDER — ONDANSETRON HCL 4 MG/2ML IJ SOLN: 4.0000 mg | Freq: Four times a day (QID) | INTRAMUSCULAR | Status: DC | PRN

## 2017-04-07 MED ORDER — GUAIFENESIN-DM 100-10 MG/5ML PO SYRP: 15.0000 mL | ORAL_SOLUTION | ORAL | Status: DC | PRN

## 2017-04-07 MED ORDER — VANCOMYCIN HCL IN DEXTROSE 1-5 GM/200ML-% IV SOLN
INTRAVENOUS | Status: AC
  Filled 2017-04-07: qty 200

## 2017-04-07 MED ORDER — FENTANYL CITRATE (PF) 100 MCG/2ML IJ SOLN
INTRAMUSCULAR | Status: AC
  Administered 2017-04-07: 25 ug via INTRAVENOUS
  Filled 2017-04-07: qty 2

## 2017-04-07 MED ORDER — DEXAMETHASONE SODIUM PHOSPHATE 10 MG/ML IJ SOLN
INTRAMUSCULAR | Status: AC
  Filled 2017-04-07: qty 1

## 2017-04-07 MED ORDER — DOCUSATE SODIUM 100 MG PO CAPS
100.0000 mg | ORAL_CAPSULE | Freq: Every day | ORAL | Status: DC
  Administered 2017-04-08: 100 mg via ORAL
  Filled 2017-04-07: qty 1

## 2017-04-07 MED ORDER — PHENOL 1.4 % MT LIQD: 1.0000 | OROMUCOSAL | Status: DC | PRN

## 2017-04-07 MED ORDER — OXYCODONE HCL 5 MG/5ML PO SOLN: 5.0000 mg | Freq: Once | ORAL | Status: DC | PRN

## 2017-04-07 MED ORDER — PHENYLEPHRINE HCL 10 MG/ML IJ SOLN
INTRAVENOUS | Status: DC | PRN
  Administered 2017-04-07: 40 ug/min via INTRAVENOUS

## 2017-04-07 MED ORDER — METOPROLOL TARTRATE 5 MG/5ML IV SOLN
INTRAVENOUS | Status: AC
  Filled 2017-04-07: qty 5

## 2017-04-07 MED ORDER — POTASSIUM CHLORIDE CRYS ER 20 MEQ PO TBCR
20.0000 meq | EXTENDED_RELEASE_TABLET | Freq: Once | ORAL | Status: DC
  Filled 2017-04-07: qty 2

## 2017-04-07 MED ORDER — ACETAMINOPHEN 160 MG/5ML PO SOLN: 325.0000 mg | ORAL | Status: DC | PRN

## 2017-04-07 MED ORDER — FENTANYL CITRATE (PF) 250 MCG/5ML IJ SOLN
INTRAMUSCULAR | Status: AC
  Filled 2017-04-07: qty 5

## 2017-04-07 MED ORDER — PHENYLEPHRINE 40 MCG/ML (10ML) SYRINGE FOR IV PUSH (FOR BLOOD PRESSURE SUPPORT)
PREFILLED_SYRINGE | INTRAVENOUS | Status: AC
  Filled 2017-04-07: qty 10

## 2017-04-07 MED ORDER — SIMVASTATIN 40 MG PO TABS
40.0000 mg | ORAL_TABLET | Freq: Every evening | ORAL | Status: DC
  Administered 2017-04-07: 40 mg via ORAL
  Filled 2017-04-07: qty 1

## 2017-04-07 MED ORDER — ONDANSETRON HCL 4 MG/2ML IJ SOLN
INTRAMUSCULAR | Status: DC | PRN
  Administered 2017-04-07: 4 mg via INTRAVENOUS

## 2017-04-07 MED ORDER — ALLOPURINOL 300 MG PO TABS
300.0000 mg | ORAL_TABLET | Freq: Every day | ORAL | Status: DC
  Administered 2017-04-08: 300 mg via ORAL
  Filled 2017-04-07 (×2): qty 1

## 2017-04-07 MED ORDER — LACTATED RINGERS IV SOLN
INTRAVENOUS | Status: DC | PRN
  Administered 2017-04-07: 09:00:00 via INTRAVENOUS

## 2017-04-07 MED ORDER — SUGAMMADEX SODIUM 200 MG/2ML IV SOLN
INTRAVENOUS | Status: AC
  Filled 2017-04-07: qty 2

## 2017-04-07 MED ORDER — SODIUM CHLORIDE 0.9 % IV SOLN
INTRAVENOUS | Status: DC
  Administered 2017-04-07: 03:00:00 via INTRAVENOUS

## 2017-04-07 MED ORDER — LABETALOL HCL 5 MG/ML IV SOLN: 10.0000 mg | INTRAVENOUS | Status: DC | PRN

## 2017-04-07 MED ORDER — ACETAMINOPHEN 325 MG PO TABS: 325.0000 mg | ORAL_TABLET | ORAL | Status: DC | PRN

## 2017-04-07 MED ORDER — HEPARIN SODIUM (PORCINE) 5000 UNIT/ML IJ SOLN
5000.0000 [IU] | Freq: Three times a day (TID) | INTRAMUSCULAR | Status: DC
  Administered 2017-04-07 – 2017-04-08 (×2): 5000 [IU] via SUBCUTANEOUS
  Filled 2017-04-07 (×3): qty 1

## 2017-04-07 MED ORDER — MAGNESIUM SULFATE 2 GM/50ML IV SOLN: 2.0000 g | Freq: Every day | INTRAVENOUS | Status: DC | PRN

## 2017-04-07 MED ORDER — 0.9 % SODIUM CHLORIDE (POUR BTL) OPTIME
TOPICAL | Status: DC | PRN
  Administered 2017-04-07: 1000 mL

## 2017-04-07 MED ORDER — HEPARIN SODIUM (PORCINE) 1000 UNIT/ML IJ SOLN
INTRAMUSCULAR | Status: AC
  Filled 2017-04-07: qty 1

## 2017-04-07 MED ORDER — PROTAMINE SULFATE 10 MG/ML IV SOLN
INTRAVENOUS | Status: DC | PRN
  Administered 2017-04-07 (×2): 20 mg via INTRAVENOUS
  Administered 2017-04-07: 10 mg via INTRAVENOUS

## 2017-04-07 MED ORDER — POTASSIUM CHLORIDE CRYS ER 20 MEQ PO TBCR: 20.0000 meq | EXTENDED_RELEASE_TABLET | Freq: Every day | ORAL | Status: DC | PRN

## 2017-04-07 MED ORDER — PANTOPRAZOLE SODIUM 40 MG PO TBEC
40.0000 mg | DELAYED_RELEASE_TABLET | Freq: Every day | ORAL | Status: DC
  Filled 2017-04-07: qty 1

## 2017-04-07 MED ORDER — METOPROLOL TARTRATE 5 MG/5ML IV SOLN
2.0000 mg | INTRAVENOUS | Status: AC | PRN
  Administered 2017-04-07 (×2): 2.5 mg via INTRAVENOUS

## 2017-04-07 MED ORDER — MORPHINE SULFATE (PF) 2 MG/ML IV SOLN: 2.0000 mg | INTRAVENOUS | Status: DC | PRN

## 2017-04-07 MED ORDER — SODIUM CHLORIDE 0.9 % IV SOLN
INTRAVENOUS | Status: DC
  Administered 2017-04-07: 13:00:00 via INTRAVENOUS

## 2017-04-07 MED ORDER — ROCURONIUM BROMIDE 100 MG/10ML IV SOLN
INTRAVENOUS | Status: DC | PRN
  Administered 2017-04-07: 60 mg via INTRAVENOUS

## 2017-04-07 MED ORDER — DEXAMETHASONE SODIUM PHOSPHATE 10 MG/ML IJ SOLN
INTRAMUSCULAR | Status: DC | PRN
  Administered 2017-04-07: 10 mg via INTRAVENOUS

## 2017-04-07 MED ORDER — ROCURONIUM BROMIDE 10 MG/ML (PF) SYRINGE
PREFILLED_SYRINGE | INTRAVENOUS | Status: AC
  Filled 2017-04-07: qty 5

## 2017-04-07 MED ORDER — HEPARIN SODIUM (PORCINE) 1000 UNIT/ML IJ SOLN
INTRAMUSCULAR | Status: DC | PRN
  Administered 2017-04-07: 7500 [IU] via INTRAVENOUS

## 2017-04-07 MED ORDER — METOPROLOL TARTRATE 25 MG PO TABS
25.0000 mg | ORAL_TABLET | Freq: Two times a day (BID) | ORAL | Status: DC
  Administered 2017-04-07 – 2017-04-08 (×2): 25 mg via ORAL
  Filled 2017-04-07 (×2): qty 1

## 2017-04-07 MED ORDER — PHENYLEPHRINE HCL 10 MG/ML IJ SOLN
INTRAMUSCULAR | Status: DC | PRN
  Administered 2017-04-07: 40 ug via INTRAVENOUS
  Administered 2017-04-07 (×3): 80 ug via INTRAVENOUS

## 2017-04-07 MED ORDER — ALUM & MAG HYDROXIDE-SIMETH 200-200-20 MG/5ML PO SUSP: 15.0000 mL | ORAL | Status: DC | PRN

## 2017-04-07 MED ORDER — CLINDAMYCIN PHOSPHATE 300 MG/50ML IV SOLN
300.0000 mg | Freq: Three times a day (TID) | INTRAVENOUS | Status: AC
  Administered 2017-04-07 (×2): 300 mg via INTRAVENOUS
  Filled 2017-04-07 (×2): qty 50

## 2017-04-07 MED ORDER — PROTAMINE SULFATE 10 MG/ML IV SOLN
INTRAVENOUS | Status: AC
  Filled 2017-04-07: qty 5

## 2017-04-07 MED ORDER — OXYCODONE HCL 5 MG PO TABS: 5.0000 mg | ORAL_TABLET | Freq: Once | ORAL | Status: DC | PRN

## 2017-04-07 MED ORDER — PROPOFOL 10 MG/ML IV BOLUS
INTRAVENOUS | Status: AC
  Filled 2017-04-07: qty 20

## 2017-04-07 MED ORDER — HYDRALAZINE HCL 20 MG/ML IJ SOLN: 5.0000 mg | INTRAMUSCULAR | Status: DC | PRN

## 2017-04-07 MED ORDER — VANCOMYCIN HCL IN DEXTROSE 1-5 GM/200ML-% IV SOLN
1000.0000 mg | INTRAVENOUS | Status: AC
Start: 2017-04-07 — End: 2017-04-07
  Administered 2017-04-07: 1000 mg via INTRAVENOUS

## 2017-04-07 MED ORDER — PANTOPRAZOLE SODIUM 40 MG PO TBEC: 40.0000 mg | DELAYED_RELEASE_TABLET | Freq: Every day | ORAL | Status: DC

## 2017-04-07 MED ORDER — VITAMIN D 1000 UNITS PO TABS
1000.0000 [IU] | ORAL_TABLET | Freq: Every day | ORAL | Status: DC
  Administered 2017-04-08: 1000 [IU] via ORAL
  Filled 2017-04-07: qty 1

## 2017-04-07 MED ORDER — ONDANSETRON HCL 4 MG/2ML IJ SOLN
INTRAMUSCULAR | Status: AC
  Filled 2017-04-07: qty 2

## 2017-04-07 MED ORDER — PROPOFOL 10 MG/ML IV BOLUS
INTRAVENOUS | Status: DC | PRN
  Administered 2017-04-07 (×2): 20 mg via INTRAVENOUS
  Administered 2017-04-07: 30 mg via INTRAVENOUS
  Administered 2017-04-07: 40 mg via INTRAVENOUS

## 2017-04-07 MED ORDER — IODIXANOL 320 MG/ML IV SOLN
INTRAVENOUS | Status: DC | PRN
  Administered 2017-04-07: 150 mL via INTRAVENOUS

## 2017-04-07 MED ORDER — METOPROLOL TARTRATE 5 MG/5ML IV SOLN
2.0000 mg | INTRAVENOUS | Status: DC | PRN
  Filled 2017-04-07: qty 5

## 2017-04-07 MED ORDER — OXYCODONE-ACETAMINOPHEN 5-325 MG PO TABS: 1.0000 | ORAL_TABLET | ORAL | Status: DC | PRN

## 2017-04-07 MED ORDER — ACETAMINOPHEN 325 MG RE SUPP: 325.0000 mg | RECTAL | Status: DC | PRN

## 2017-04-07 MED ORDER — LIDOCAINE HCL (CARDIAC) 20 MG/ML IV SOLN
INTRAVENOUS | Status: AC
  Filled 2017-04-07: qty 5

## 2017-04-07 MED ORDER — FENTANYL CITRATE (PF) 100 MCG/2ML IJ SOLN
INTRAMUSCULAR | Status: DC | PRN
  Administered 2017-04-07: 150 ug via INTRAVENOUS

## 2017-04-07 MED ORDER — SODIUM CHLORIDE 0.9 % IV SOLN: 500.0000 mL | Freq: Once | INTRAVENOUS | Status: DC | PRN

## 2017-04-07 MED ORDER — NITROGLYCERIN 0.4 MG SL SUBL: 0.4000 mg | SUBLINGUAL_TABLET | SUBLINGUAL | Status: DC | PRN

## 2017-04-07 MED ORDER — LIDOCAINE HCL (CARDIAC) 20 MG/ML IV SOLN
INTRAVENOUS | Status: DC | PRN
  Administered 2017-04-07: 60 mg via INTRAVENOUS

## 2017-04-07 SURGICAL SUPPLY — 63 items
ADH SKN CLS APL DERMABOND .7 (GAUZE/BANDAGES/DRESSINGS) ×2
BLADE CLIPPER SURG (BLADE) ×3 IMPLANT
CANISTER SUCT 3000ML PPV (MISCELLANEOUS) ×3 IMPLANT
CATH ANGIO 5F BER2 65CM (CATHETERS) IMPLANT
CATH BEACON 5.038 65CM KMP-01 (CATHETERS) ×2 IMPLANT
CATH OMNI FLUSH .035X70CM (CATHETERS) ×2 IMPLANT
COVER PROBE W GEL 5X96 (DRAPES) ×1 IMPLANT
DERMABOND ADVANCED (GAUZE/BANDAGES/DRESSINGS) ×4
DERMABOND ADVANCED .7 DNX12 (GAUZE/BANDAGES/DRESSINGS) ×1 IMPLANT
DEVICE CLOSURE PERCLS PRGLD 6F (VASCULAR PRODUCTS) IMPLANT
DRAPE ZERO GRAVITY STERILE (DRAPES) ×1 IMPLANT
DRSG TEGADERM 2-3/8X2-3/4 SM (GAUZE/BANDAGES/DRESSINGS) ×6 IMPLANT
DRYSEAL FLEXSHEATH 12FR 33CM (SHEATH) ×2
DRYSEAL FLEXSHEATH 18FR 33CM (SHEATH) ×2
ELECT CAUTERY BLADE 6.4 (BLADE) ×3 IMPLANT
ELECT REM PT RETURN 9FT ADLT (ELECTROSURGICAL) ×6
ELECTRODE REM PT RTRN 9FT ADLT (ELECTROSURGICAL) ×2 IMPLANT
EXCLUDER TNK 28X14.5MMX12CM (Endovascular Graft) IMPLANT
EXCLUDER TRUNK 28X14.5MMX12CM (Endovascular Graft) ×3 IMPLANT
GAUZE SPONGE 2X2 8PLY NS (GAUZE/BANDAGES/DRESSINGS) ×4 IMPLANT
GAUZE SPONGE 2X2 8PLY STRL LF (GAUZE/BANDAGES/DRESSINGS) ×2 IMPLANT
GLOVE BIOGEL M STRL SZ7.5 (GLOVE) ×2 IMPLANT
GLOVE BIOGEL PI IND STRL 7.5 (GLOVE) ×1 IMPLANT
GLOVE BIOGEL PI INDICATOR 7.5 (GLOVE) ×6
GLOVE SURG SS PI 7.5 STRL IVOR (GLOVE) ×3 IMPLANT
GOWN STRL REUS W/ TWL LRG LVL3 (GOWN DISPOSABLE) ×2 IMPLANT
GOWN STRL REUS W/ TWL XL LVL3 (GOWN DISPOSABLE) ×1 IMPLANT
GOWN STRL REUS W/TWL LRG LVL3 (GOWN DISPOSABLE) ×6
GOWN STRL REUS W/TWL XL LVL3 (GOWN DISPOSABLE) ×3
GRAFT BALLN CATH 65CM (STENTS) IMPLANT
HEMOSTAT SNOW SURGICEL 2X4 (HEMOSTASIS) IMPLANT
KIT BASIN OR (CUSTOM PROCEDURE TRAY) ×3 IMPLANT
KIT ROOM TURNOVER OR (KITS) ×3 IMPLANT
LEG CONTRALATERAL 16X12X12 (Vascular Products) ×2 IMPLANT
LEG CONTRALATERAL 16X16X9.5 (Endovascular Graft) ×2 IMPLANT
NDL PERC 18GX7CM (NEEDLE) ×1 IMPLANT
NEEDLE PERC 18GX7CM (NEEDLE) ×3 IMPLANT
NS IRRIG 1000ML POUR BTL (IV SOLUTION) ×3 IMPLANT
PACK ENDOVASCULAR (PACKS) ×3 IMPLANT
PAD ARMBOARD 7.5X6 YLW CONV (MISCELLANEOUS) ×6 IMPLANT
PENCIL BUTTON HOLSTER BLD 10FT (ELECTRODE) ×3 IMPLANT
PERCLOSE PROGLIDE 6F (VASCULAR PRODUCTS) ×12
SHEATH AVANTI 11CM 8FR (SHEATH) ×2 IMPLANT
SHEATH BRITE TIP 8FR 23CM (SHEATH) ×2 IMPLANT
SHEATH DRYSEAL FLEX 12FR 33CM (SHEATH) IMPLANT
SHEATH DRYSEAL FLEX 18FR 33CM (SHEATH) IMPLANT
SHIELD RADPAD SCOOP 12X17 (MISCELLANEOUS) ×6 IMPLANT
SPONGE GAUZE 2X2 STER 10/PKG (GAUZE/BANDAGES/DRESSINGS) ×2
STENT GRAFT BALLN CATH 65CM (STENTS) ×2
STOPCOCK MORSE 400PSI 3WAY (MISCELLANEOUS) ×3 IMPLANT
SUT ETHILON 3 0 PS 1 (SUTURE) IMPLANT
SUT PROLENE 5 0 C 1 24 (SUTURE) IMPLANT
SUT VIC AB 2-0 CT1 27 (SUTURE)
SUT VIC AB 2-0 CT1 TAPERPNT 27 (SUTURE) IMPLANT
SUT VIC AB 3-0 SH 27 (SUTURE)
SUT VIC AB 3-0 SH 27X BRD (SUTURE) IMPLANT
SUT VICRYL 4-0 PS2 18IN ABS (SUTURE) IMPLANT
SYR 30ML LL (SYRINGE) ×3 IMPLANT
TOWEL GREEN STERILE (TOWEL DISPOSABLE) ×3 IMPLANT
TRAY FOLEY MTR SLVR 16FR STAT (CATHETERS) ×3 IMPLANT
TUBING HIGH PRESSURE 120CM (CONNECTOR) ×3 IMPLANT
WIRE AMPLATZ SS-J .035X180CM (WIRE) ×4 IMPLANT
WIRE BENTSON .035X145CM (WIRE) ×4 IMPLANT

## 2017-04-07 NOTE — Interval H&P Note (Signed)
History and Physical Interval Note:  04/07/2017 8:13 AM  Justin Lyons  has presented today for surgery, with the diagnosis of ABDOMINAL ANEURYSM  The various methods of treatment have been discussed with the patient and family. After consideration of risks, benefits and other options for treatment, the patient has consented to  Procedure(s): ENDOVASCULAR ABDOMINAL ANEURYSM REPAIR (N/A) as a surgical intervention .  The patient's history has been reviewed, patient examined, no change in status, stable for surgery.  I have reviewed the patient's chart and labs.  Questions were answered to the patient's satisfaction.     Annamarie Major

## 2017-04-07 NOTE — Op Note (Signed)
Patient name: Justin Lyons MRN: [id number] DOB: [birth year] Sex: male  04/07/2017 Pre-operative Diagnosis: symptomatic AAA Post-operative diagnosis:  Same Surgeon:  Annamarie Major Assistants:  Arlee Muslim Procedure:   #1: Endovascular repair of abdominal aortic aneurysm   #2: Bilateral ultrasound-guided common femoral artery access   #3: Catheter and aorta x2   #4: Abdominal aortogram Anesthesia: General Blood Loss: Minimal Specimens: None  Findings: Complete exclusion  Indications: The patient presented to the emergency department last night with right flank pain.  He underwent a CT scan which showed a 5.4 cm infrarenal aneurysm.  Previously in December 2018 it measured 4.1 cm by ultrasound.  We discussed proceeding with repair given the changes identified on CT scan.  Devices used: Main body was primary right Gore 28 x 14 x 12.  Ipsilateral right extension was a Gore 16 x 9.5.  Contralateral left was a Gouru 12 x 12.  Procedure:  The patient was identified in the holding area and taken to [facility]  The patient was then placed supine on the table. general anesthesia was administered.  The patient was prepped and draped in the usual sterile fashion.  A time out was called and antibiotics were administered.  Ultrasound was used to evaluate bilateral common femoral arteries which were mildly calcified but patent.  A #11 blade was used to make a skin nick bilaterally.  Bilateral common femoral arteries were then cannulated under ultrasound guidance with an 18-gauge needle.  An 035 wire was advanced into the aorta without resistance.  Sub-cutaneous tract was dilated with an 8 French dilator and pro-glide devices were placed at the 11:00 and 1 o'clock position for pre-closure.  8 French sheaths were placed bilaterally.  The patient was fully heparinized.  An Omni Flush catheter was advanced to the level of L1 to the left side.  Using a Kumpe catheter, a Amplatz superstiff wire was advanced up  the right side.  The 8 French sheath was removed and a 18 French sheath was placed.  The main body was prepared on the back table.  This was a Gore 28 x 14 x 12 device.  It was then advanced into the aorta through the 18 French sheath.  A 10 degree cranial position, an abdominal aortogram was performed locating the renal arteries.  The device was then deployed landing at the level of the renal arteries down to the contralateral gate.  Next, the contralateral gate was cannulated with a Kumpe catheter and a Bentson wire.  Once again was cannulated, the Kumpe catheter was removed and a Omni Flush catheter was advanced into the main body, which was able to be freely rotated, confirming successful cannulation.  An Amplatz superstiff wire was then placed.  The image detector was then brought down to a right anterior oblique position and a retrograde injection was performed to the sheath located in the left hypogastric artery which was stenotic.  The 8 French sheath was exchanged out for a 12 [place] sheath which was advanced into the contralateral gate.  The contralateral limb was prepared on the back table and inserted.  This was a Gouru 12 x 12 device.  It was deployed landing short of the left hypogastric artery.  Next the remaining portion of the ipsilateral device was deployed.  At retrograde injection was performed through the sheath in the right groin with the image detector and a left anterior oblique position.  This identified a small dissection in the distal common iliac  artery.  I then inserted a Gore 16 x 9.5 device and deployed it landing just above the hypogastric artery, covering a dissection flap.  Next, a MOB - 37 completion arteriogram was then performed which showed complete exclusion of the aneurysm sac with continued patency of bilateral renal arteries.  The left hypogastric artery remained patent.  There appeared to be occlusion of the right hypogastric artery.  No further intervention was  recommended.  The Amplatz superstiff wires were exchanged for a Bentson wires.  Both sheaths were removed and the pro glide devices were used to close the arteriotomy sites bilaterally.  50 mg of protamine was given.  Manual pressure was held on the groins for 5 minutes.  Cautery was used to reapproximate the skin edges.  Dermabond was applied.  Patient had palpable pedal pulses after the case.  He was successfully extubated and taken to recovery room in stable condition. Balloon was inserted to mold the proximal and distal attachment sites as well as device overlap.  Disposition: To PACU stable   V. Annamarie Major, M.D. [facility] Office: [phone] Pager:  [phone]

## 2017-04-07 NOTE — ED Notes (Signed)
Requested hospital bed from [facility] 

## 2017-04-07 NOTE — Progress Notes (Signed)
Attempt to call report.

## 2017-04-07 NOTE — ED Notes (Signed)
Bilat pulses to feet WNL. Good color in both legs and denies pain at this time.

## 2017-04-07 NOTE — Anesthesia Procedure Notes (Signed)
Procedure Name: Intubation Date/Time: 04/07/2017 9:20 AM Performed by: Candis Shine, CRNA Pre-anesthesia Checklist: Patient identified, Emergency Drugs available, Suction available and Patient being monitored Patient Re-evaluated:Patient Re-evaluated prior to induction Oxygen Delivery Method: Circle System Utilized Preoxygenation: Pre-oxygenation with 100% oxygen Induction Type: IV induction Ventilation: Mask ventilation without difficulty Laryngoscope Size: Mac and 3 Grade View: Grade I Tube type: Oral Tube size: 7.5 mm Number of attempts: 1 Airway Equipment and Method: Stylet Placement Confirmation: ETT inserted through vocal cords under direct vision,  positive ETCO2 and breath sounds checked- equal and bilateral Secured at: 23 cm Tube secured with: Tape Dental Injury: Teeth and Oropharynx as per pre-operative assessment

## 2017-04-07 NOTE — ED Notes (Addendum)
Patient denies pain anywhere and is resting comfortably. NAD. Pt pleasant and states that he feels fine currently. Wife sleeping in chair next to pts bed. Will continue to monitor.

## 2017-04-07 NOTE — ED Notes (Signed)
Moved to a regular hosp bed for comfort

## 2017-04-07 NOTE — ED Notes (Addendum)
Patient denies pain anywhere and is resting comfortably. NAD. VSS. Pt resting comfortably. Will continue to closely monitor. Legs have good color bilat with good pulses. Warm and dry.

## 2017-04-07 NOTE — ED Notes (Addendum)
Patient denies pain and is resting comfortably. Bilat legs good color and good pulses. No complaints at this time.

## 2017-04-07 NOTE — Anesthesia Preprocedure Evaluation (Addendum)
Anesthesia Evaluation  Patient identified by MRN, date of birth, ID band Patient awake    Reviewed: Allergy & Precautions, NPO status , Patient's Chart, lab work & pertinent test results, reviewed documented beta blocker date and time   History of Anesthesia Complications Negative for: history of anesthetic complications  Airway Mallampati: II  TM Distance: >3 FB Neck ROM: Full    Dental  (+) Lower Dentures, Teeth Intact   Pulmonary COPD, former smoker,    breath sounds clear to auscultation       Cardiovascular hypertension, Pt. on medications and Pt. on home beta blockers (-) angina+ CAD, + Past MI, + Cardiac Stents and + Peripheral Vascular Disease   Rhythm:Regular     Neuro/Psych negative neurological ROS  negative psych ROS   GI/Hepatic negative GI ROS, Neg liver ROS,   Endo/Other  negative endocrine ROS  Renal/GU negative Renal ROS     Musculoskeletal  (+) Arthritis ,   Abdominal   Peds  Hematology negative hematology ROS (+)   Anesthesia Other Findings Cardiac stent 2005, no recent visits to cardiologist, denies CP/SOB and wife expresses "he is very active."  Reproductive/Obstetrics                            Anesthesia Physical Anesthesia Plan  ASA: III  Anesthesia Plan: General   Post-op Pain Management:    Induction: Intravenous  PONV Risk Score and Plan: 2 and Ondansetron and Dexamethasone  Airway Management Planned: Oral ETT  Additional Equipment: Arterial line  Intra-op Plan:   Post-operative Plan: Extubation in OR  Informed Consent: I have reviewed the patients History and Physical, chart, labs and discussed the procedure including the risks, benefits and alternatives for the proposed anesthesia with the patient or authorized representative who has indicated his/her understanding and acceptance.   Dental advisory given  Plan Discussed with: CRNA and  Surgeon  Anesthesia Plan Comments:         Anesthesia Quick Evaluation

## 2017-04-07 NOTE — Transfer of Care (Signed)
Immediate Anesthesia Transfer of Care Note  Patient: Justin Lyons  Procedure(s) Performed: ENDOVASCULAR ABDOMINAL ANEURYSM REPAIR (N/A )  Patient Location: PACU  Anesthesia Type:General  Level of Consciousness: awake, alert  and oriented  Airway & Oxygen Therapy: Patient Spontanous Breathing and Patient connected to nasal cannula oxygen  Post-op Assessment: Report given to RN and Post -op Vital signs reviewed and stable  Post vital signs: Reviewed and stable  Last Vitals:  Vitals:   04/07/17 0730 04/07/17 0800  BP: (!) 143/59 136/65  Pulse: 79 79  Resp: 17 18  Temp:    SpO2: 95% 95%    Last Pain:  Vitals:   04/07/17 0733  PainSc: 0-No pain         Complications: No apparent anesthesia complications

## 2017-04-07 NOTE — ED Notes (Signed)
No pain

## 2017-04-07 NOTE — ED Notes (Addendum)
Pt. Gave a sample earlier, but was dumped out. Pt. Stated that he could pee again, so currently working on another sample for UA testing.

## 2017-04-07 NOTE — ED Notes (Signed)
Patient transported to X-ray 

## 2017-04-07 NOTE — Anesthesia Procedure Notes (Signed)
Arterial Line Insertion Start/End3/17/2019 8:40 AM Performed by: Candis Shine, CRNA, CRNA  Patient location: Pre-op. Preanesthetic checklist: patient identified, IV checked, site marked, risks and benefits discussed, surgical consent, monitors and equipment checked, pre-op evaluation, timeout performed and anesthesia consent Lidocaine 1% used for infiltration Right, radial was placed Catheter size: 20 G Hand hygiene performed  and maximum sterile barriers used   Attempts: 1 Procedure performed without using ultrasound guided technique. Following insertion, dressing applied and Biopatch. Post procedure assessment: normal and unchanged  Patient tolerated the procedure well with no immediate complications.

## 2017-04-07 NOTE — ED Notes (Signed)
No pain anywhere.  Pt placed in regular hospital bed for comfort

## 2017-04-07 NOTE — ED Notes (Signed)
Saline lok irrigated

## 2017-04-08 ENCOUNTER — Telehealth: Payer: Self-pay | Admitting: Surgery

## 2017-04-08 ENCOUNTER — Encounter (HOSPITAL_COMMUNITY): Payer: Self-pay | Admitting: Surgery

## 2017-04-08 LAB — BASIC METABOLIC PANEL
ANION GAP: 8 (ref 5–15)
BUN: 9 mg/dL (ref 6–20)
CHLORIDE: 104 mmol/L (ref 101–111)
CO2: 26 mmol/L (ref 22–32)
Calcium: 8.7 mg/dL — ABNORMAL LOW (ref 8.9–10.3)
Creatinine, Ser: 0.91 mg/dL (ref 0.61–1.24)
GFR calc Af Amer: >60 mL/min (ref >60)
GLUCOSE: 120 mg/dL — AB (ref 65–99)
POTASSIUM: 4.4 mmol/L (ref 3.5–5.1)
Sodium: 138 mmol/L (ref 135–145)

## 2017-04-08 LAB — CBC
HCT: 40.2 % (ref 39.0–52.0)
HEMOGLOBIN: 13.3 g/dL (ref 13.0–17.0)
MCH: 31.6 pg (ref 26.0–34.0)
MCHC: 33.1 g/dL (ref 30.0–36.0)
MCV: 95.5 fL (ref 78.0–100.0)
PLATELETS: 174 10*3/uL (ref 150–400)
RBC: 4.21 MIL/uL — AB (ref 4.22–5.81)
RDW: 13.5 % (ref 11.5–15.5)
WBC: 13.4 10*3/uL — AB (ref 4.0–10.5)

## 2017-04-08 MED ORDER — OXYCODONE-ACETAMINOPHEN 5-325 MG PO TABS: 1.0000 | ORAL_TABLET | Freq: Four times a day (QID) | ORAL | 0 refills | Status: DC | PRN

## 2017-04-08 NOTE — Progress Notes (Addendum)
  Progress Note    04/08/2017 7:52 AM 1 Day Post-Op  Subjective:  Pain controlled; patient ambulating through hallway without difficulty   Vitals:   04/08/17 0552 04/08/17 0600  BP: 121/62 115/64  Pulse:    Resp: 14 15  Temp:    SpO2: 98% 96%   Physical Exam: Cardiac:  Regular Lungs:  Non labored Incisions:  B groin cath sites soft, no hematoma, no bleeding Extremities:  Palpable and symmetrical DP pulses Abdomen:  Soft Neurologic: A&O  CBC    Component Value Date/Time   WBC 13.4 (H) 04/08/2017 0253   RBC 4.21 (L) 04/08/2017 0253   HGB 13.3 04/08/2017 0253   HCT 40.2 04/08/2017 0253   PLT 174 04/08/2017 0253   MCV 95.5 04/08/2017 0253   MCH 31.6 04/08/2017 0253   MCHC 33.1 04/08/2017 0253   RDW 13.5 04/08/2017 0253   LYMPHSABS 2.0 04/06/2017 1935   MONOABS 0.5 04/06/2017 1935   EOSABS 0.3 04/06/2017 1935   BASOSABS 0.0 04/06/2017 1935    BMET    Component Value Date/Time   NA 138 04/08/2017 0253   K 4.4 04/08/2017 0253   CL 104 04/08/2017 0253   CO2 26 04/08/2017 0253   GLUCOSE 120 (H) 04/08/2017 0253   BUN 9 04/08/2017 0253   CREATININE 0.91 04/08/2017 0253   CALCIUM 8.7 (L) 04/08/2017 0253   GFRNONAA >60 04/08/2017 0253   GFRAA >60 04/08/2017 0253    INR    Component Value Date/Time   INR 1.04 04/07/2017 0504     Intake/Output Summary (Last 24 hours) at 04/08/2017 0752 Last data filed at 04/08/2017 0600 Gross per 24 hour  Intake 3801.25 ml  Output 2020 ml  Net 1781.25 ml     Assessment/Plan:  82 y.o. male is s/p endovascular repair of AAA 1 Day Post-Op   Palpable pedal pulses; back pain relieved Ok for discharge home today s/p AAA repair CTA abd/pelvis in 3-4 weeks; office will call to arrange appt   Dagoberto Ligas, PA-C [facility] [phone] 04/08/2017 7:52 AM  Agree with the above D/c today   Annamarie Major

## 2017-04-08 NOTE — Discharge Instructions (Signed)
   [facility] [place]   Discharge Instructions  Endovascular Aortic Aneurysm Repair  Please refer to the following instructions for your post-procedure care. Your surgeon or Physician Assistant will discuss any changes with you.  Activity  You are encouraged to walk as much as you can. You can slowly return to normal activities but must avoid strenuous activity and heavy lifting until your doctor tells you it's OK. Avoid activities such as vacuuming or swinging a gold club. It is normal to feel tired for several weeks after your surgery. Do not drive until your doctor gives the OK and you are no longer taking prescription pain medications. It is also normal to have difficulty with sleep habits, eating, and bowel movements after surgery. These will go away with time.  Bathing/Showering  You may shower after you go home. If you have an incision, do not soak in a bathtub, hot tub, or swim until the incision heals completely.  Incision Care  Shower every day. Clean your incision with mild soap and water. Pat the area dry with a clean towel. You do not need a bandage unless otherwise instructed. Do not apply any ointments or creams to your incision. If you clothing is irritating, you may cover your incision with a dry gauze pad.  Diet  Resume your normal diet. There are no special food restrictions following this procedure. A low fat/low cholesterol diet is recommended for all patients with vascular disease. In order to heal from your surgery, it is CRITICAL to get adequate nutrition. Your body requires vitamins, minerals, and protein. Vegetables are the best source of vitamins and minerals. Vegetables also provide the perfect balance of protein. Processed food has little nutritional value, so try to avoid this.  Medications  Resume taking all of your medications unless your doctor or nurse practitioner tells you not to. If your incision is causing pain, you may take  over-the-counter pain relievers such as acetaminophen (Tylenol). If you were prescribed a stronger pain medication, please be aware these medications can cause nausea and constipation. Prevent nausea by taking the medication with a snack or meal. Avoid constipation by drinking plenty of fluids and eating foods with a high amount of fiber, such as fruits, vegetables, and grains. Do not take Tylenol if you are taking prescription pain medications.   Follow up  Our office will schedule a follow-up appointment with a C.T. scan 3-4 weeks after your surgery.  Please call us immediately for any of the following conditions  Severe or worsening pain in your legs or feet or in your abdomen back or chest. Increased pain, redness, drainage (pus) from your incision sit. Increased abdominal pain, bloating, nausea, vomiting or persistent diarrhea. Fever of 101 degrees or higher. Swelling in your leg (s),  Reduce your risk of vascular disease  Stop smoking. If you would like help call [facility] at 1-800-QUIT-NOW (1-800-784-8669) or [place] at [phone]. Manage your cholesterol Maintain a desired weight Control your diabetes Keep your blood pressure down  If you have questions, please call the office at [phone].   

## 2017-04-08 NOTE — Anesthesia Postprocedure Evaluation (Signed)
Anesthesia Post Note  Patient: Justin Lyons  Procedure(s) Performed: ENDOVASCULAR ABDOMINAL ANEURYSM REPAIR (N/A )     Patient location during evaluation: PACU Anesthesia Type: General Level of consciousness: awake and alert Pain management: pain level controlled Vital Signs Assessment: post-procedure vital signs reviewed and stable Respiratory status: spontaneous breathing, nonlabored ventilation, respiratory function stable and patient connected to nasal cannula oxygen Cardiovascular status: blood pressure returned to baseline and stable Postop Assessment: no apparent nausea or vomiting Anesthetic complications: no    Last Vitals:  Vitals:   04/07/17 2341 04/08/17 0000  BP: (!) 114/94 (!) 120/56  Pulse:    Resp: 14 13  Temp: 36.5 C   SpO2: 96% 98%    Last Pain:  Vitals:   04/07/17 2341  TempSrc: Oral  PainSc:                  Domenic Schoenberger

## 2017-04-08 NOTE — Progress Notes (Signed)
Patient given discharge packet. Education given on medication regimen and follow up appointments. Patient and family have no further questions at this time. Patient to DC home with his son. Will continue to monitor.

## 2017-04-08 NOTE — Telephone Encounter (Signed)
-----   Message from Mena Goes, RN sent at 04/08/2017  8:38 AM EDT ----- Regarding: 3-4 weeks with CTA postop EVAR   ----- Message ----- From: Iline Oven Sent: 04/08/2017   7:23 AM To: [facility] Charge Pool  Can you schedule an office visit and CTA abd/pelvis in 3-4 weeks and to see Dr. Trula Slade.  PO [facility]. Thanks, [facility]

## 2017-04-09 NOTE — Discharge Summary (Signed)
[facility] Discharge Summary   Justin Lyons 05/11/1935 82 y.o. male  MRN: [id number]  Admission Date: 04/06/2017  Discharge Date: 04/08/17  Physician: Dr. Trula Slade  Admission Diagnosis: Pre-op chest exam [V95.638] Abdominal aortic aneurysm (AAA) without rupture [facility]) [I71.4] Low back pain without sciatica, unspecified back pain laterality, unspecified chronicity [M54.5]  Discharge Day services:    see progress note 04/08/17 Physical Exam: Vitals:   04/08/17 0900 04/08/17 1000  BP: (!) 125/49 138/60  Pulse:    Resp: 17 (!) 22  Temp:    SpO2: 95% 96%    Hospital Course:  The patient was admitted to the hospital on 04/06/17 due to CT scan demonstrating possible imminent rupture of 5.4cm AAA.  He had been experiencing some flank pain over the last several days.  He was taken to the operating room on 04/07/2017 and underwent: Endovascular repair of abdominal aortic aneurysm by Dr. Trula Slade.    The pt tolerated the procedure well and was transported to the PACU in good condition.   POD#1 patient was ambulating without difficulty, tolerating a home diet, and feeling fit for discharge home.  The remainder of the hospital course consisted of increasing mobilization and increasing intake of solids without difficulty.  He will be prescribed 2 days of narcotic pain medication for continued post operative pain control.  He will follow up in office in about 4 weeks with CTA abd/pelvis.  Discharge instructions were reviewed with the patient and he voices his understanding.  He will be discharged this morning in stable condition.  CBC    Component Value Date/Time   WBC 13.4 (H) 04/08/2017 0253   RBC 4.21 (L) 04/08/2017 0253   HGB 13.3 04/08/2017 0253   HCT 40.2 04/08/2017 0253   PLT 174 04/08/2017 0253   MCV 95.5 04/08/2017 0253   MCH 31.6 04/08/2017 0253   MCHC 33.1 04/08/2017 0253   RDW 13.5 04/08/2017 0253   LYMPHSABS 2.0 04/06/2017 1935   MONOABS 0.5 04/06/2017 1935   EOSABS 0.3  04/06/2017 1935   BASOSABS 0.0 04/06/2017 1935    BMET    Component Value Date/Time   NA 138 04/08/2017 0253   K 4.4 04/08/2017 0253   CL 104 04/08/2017 0253   CO2 26 04/08/2017 0253   GLUCOSE 120 (H) 04/08/2017 0253   BUN 9 04/08/2017 0253   CREATININE 0.91 04/08/2017 0253   CALCIUM 8.7 (L) 04/08/2017 0253   GFRNONAA >60 04/08/2017 0253   GFRAA >60 04/08/2017 0253         Discharge Diagnosis:  Pre-op chest exam [V56.433] Abdominal aortic aneurysm (AAA) without rupture (HCC) [I71.4] Low back pain without sciatica, unspecified back pain laterality, unspecified chronicity [M54.5]  Secondary Diagnosis: Patient Active Problem List   Diagnosis Date Noted  . AAA (abdominal aortic aneurysm) ([place]) 04/06/2017  . Prominent popliteal pulse 01/28/2015  . Family history of malignant neoplasm of gastrointestinal tract 12/29/2013  . Abdominal aneurysm without mention of rupture 12/05/2011  . Acute bronchitis 07/12/2010  . KNEE PAIN 05/31/2009  . CAROTID BRUIT 11/02/2007  . ERECTILE DYSFUNCTION 05/02/2007  . COPD 05/02/2007  . GOUT 04/29/2007  . DIVERTICULOSIS OF COLON 04/29/2007  . COMPRESSION FRACTURE 04/29/2007  . HYPERLIPIDEMIA 11/05/2006  . HYPERTENSION 11/05/2006  . CORONARY ARTERY DISEASE 11/05/2006  . PERIPHERAL VASCULAR DISEASE 11/05/2006  . HEMORRHOIDS, INTERNAL 11/05/2006   Past Medical History:  Diagnosis Date  . AAA (abdominal aortic aneurysm) ([place])    06/02/2003 old BPG  . Arthritis   . Carotid artery occlusion   .  Carotid bruit   . COPD (chronic obstructive pulmonary disease) ([place])   . Coronary artery disease   . Diverticulosis of colon   . Erectile dysfunction   . Hemorrhoids, internal   . History of compression fracture of spine   . History of gout   . Hyperlipidemia   . Hypertension   . Knee pain   . Myocardial infarction ([place]) 2005  . Peripheral vascular disease (HCC)      Allergies as of 04/08/2017      Reactions   Doxycycline Rash    Penicillins Rash   Sulfonamide Derivatives Rash      Medication List    TAKE these medications   allopurinol 300 MG tablet Commonly known as:  ZYLOPRIM Take 1 tablet (300 mg total) by mouth daily.   aspirin 81 MG tablet Take 81 mg by mouth daily.   clopidogrel 75 MG tablet Commonly known as:  PLAVIX Take 1 tablet (75 mg total) by mouth daily.   indomethacin 25 MG capsule Commonly known as:  INDOCIN Take 1 capsule (25 mg total) by mouth 3 (three) times daily with meals. As needed for gout pain   metoprolol tartrate 50 MG tablet Commonly known as:  LOPRESSOR Take 1 tablet by mouth  twice a day   NITROSTAT 0.4 MG SL tablet Generic drug:  nitroGLYCERIN Place 0.4 mg under the tongue every 5 (five) minutes as needed.   oxyCODONE-acetaminophen 5-325 MG tablet Commonly known as:  PERCOCET/ROXICET Take 1 tablet by mouth every 6 (six) hours as needed for moderate pain.   pantoprazole 40 MG tablet Commonly known as:  PROTONIX Take 1 tablet (40 mg total) by mouth daily.   simvastatin 40 MG tablet Commonly known as:  ZOCOR Take 1 tablet (40 mg total) by mouth every evening.   Vitamin D 1000 units capsule Take 1,000 Units by mouth daily.       Discharge Instructions:   [facility]  Discharge Instructions Endovascular Aortic Aneurysm Repair  Please refer to the following instructions for your post-procedure care. Your surgeon or Physician Assistant will discuss any changes with you.  Activity  You are encouraged to walk as much as you can. You can slowly return to normal activities but must avoid strenuous activity and heavy lifting until your doctor tells you it's OK. Avoid activities such as vacuuming or swinging a gold club. It is normal to feel tired for several weeks after your surgery. Do not drive until your doctor gives the OK and you are no longer taking prescription pain medications. It is also normal to have difficulty with sleep  habits, eating, and bowel movements after surgery. These will go away with time.  Bathing/Showering  You may shower after you go home. If you have an incision, do not soak in a bathtub, hot tub, or swim until the incision heals completely.  Incision Care  Shower every day. Clean your incision with mild soap and water. Pat the area dry with a clean towel. You do not need a bandage unless otherwise instructed. Do not apply any ointments or creams to your incision. If you clothing is irritating, you may cover your incision with a dry gauze pad.  Diet  Resume your normal diet. There are no special food restrictions following this procedure. A low fat/low cholesterol diet is recommended for all patients with vascular disease. In order to heal from your surgery, it is CRITICAL to get adequate nutrition. Your body requires vitamins, minerals, and  protein. Vegetables are the best source of vitamins and minerals. Vegetables also provide the perfect balance of protein. Processed food has little nutritional value, so try to avoid this.  Medications  Resume taking all of your medications unless your doctor or Physician Assistnat tells you not to. If your incision is causing pain, you may take over-the-counter pain relievers such as acetaminophen (Tylenol). If you were prescribed a stronger pain medication, please be aware these medications can cause nausea and constipation. Prevent nausea by taking the medication with a snack or meal. Avoid constipation by drinking plenty of fluids and eating foods with a high amount of fiber, such as fruits, vegetables, and grains. Do not take Tylenol if you are taking prescription pain medications.   Follow up  [place] office will schedule a follow-up appointment with a C.T. scan 3-4 weeks after your surgery.  Please call us immediately for any of the following conditions  Severe or worsening pain in your legs or feet or in your abdomen back or chest. Increased pain,  redness, drainage (pus) from your incision sit. Increased abdominal pain, bloating, nausea, vomiting or persistent diarrhea. Fever of 101 degrees or higher. Swelling in your leg (s),  Reduce your risk of vascular disease  .Stop smoking. If you would like help call [facility] at 1-800-QUIT-NOW (413)527-4677) or [place] at 580-146-7497. .Manage your cholesterol .Maintain a desired weight .Control your diabetes .Keep your blood pressure down  If you have questions, please call the office at [phone].   Disposition: home  Patient's condition: is Good  Follow up: 1. Dr. Trula Slade in 4 weeks with CTA protocol   Dagoberto Ligas, PA-C [facility] [phone] 04/09/2017  3:47 PM   - For VQI Registry use - Post-op:  Time to Extubation: [x]  In OR, [ ]  < 12 hrs, [ ]  12-24 hrs, [ ]  >=24 hrs Vasopressors Req. Post-op: No MI: No., [ ]  Troponin only, [ ]  EKG or Clinical New Arrhythmia: No CHF: No ICU Stay: 1 day in ICU Transfusion: No     If yes,  units given  Complications: Resp failure: No., [ ]  Pneumonia, [ ]  Ventilator Chg in renal function: No., [ ]  Inc. Cr > 0.5, [ ]  Temp. Dialysis,  [ ]  Permanent dialysis Leg ischemia: No., no Surgery needed, [ ]  Yes, Surgery needed,  [ ]  Amputation Bowel ischemia: No., [ ]  Medical Rx, [ ]  Surgical Rx Wound complication: No., [ ]  Superficial separation/infection, [ ]  Return to OR Return to OR: No  Return to OR for bleeding: No Stroke: No., [ ]  Minor, [ ]  Major  Discharge medications: Statin use:  Yes  Justin use:  Yes  Plavix use:  Yes  Beta blocker use:  Yes  ARB use:  No ACEI use:  No CCB use:  No

## 2017-04-11 ENCOUNTER — Telehealth: Payer: Self-pay | Admitting: Surgery

## 2017-04-11 ENCOUNTER — Other Ambulatory Visit: Payer: Self-pay

## 2017-04-11 DIAGNOSIS — Z48812 Encounter for surgical aftercare following surgery on the circulatory system: Secondary | ICD-10-CM

## 2017-04-11 DIAGNOSIS — I714 Abdominal aortic aneurysm, without rupture, unspecified: Secondary | ICD-10-CM

## 2017-04-11 NOTE — Telephone Encounter (Signed)
Confirmed appts with pt for post op on 4/29 and CTA on 05/13/17. Mailed letter.

## 2017-04-11 NOTE — Telephone Encounter (Signed)
-----   Message from Mena Goes, RN sent at 04/08/2017  8:38 AM EDT ----- Regarding: 3-4 weeks with CTA postop EVAR   ----- Message ----- From: Iline Oven Sent: 04/08/2017   7:23 AM To: [facility] Charge Pool  Can you schedule an office visit and CTA abd/pelvis in 3-4 weeks and to see Dr. Trula Slade.  PO [facility]. Thanks, [facility]

## 2017-04-12 ENCOUNTER — Telehealth: Payer: Self-pay

## 2017-04-12 NOTE — Telephone Encounter (Signed)
Returned call to patient's daughter Justin Lyons. Patient continues to have daily fever around 101 to 102 degrees. States incisions look good, no pain or any other symptoms. I spoke with Dr. Trula Slade and he feels it is not surgery related and recommended they contact PCP to rule out UTI or something else. Daughter is going to contact them now. If they have any other concerns or issues to call us back.

## 2017-04-12 NOTE — Telephone Encounter (Signed)
Confirmed appt with  pt wife and to [facility] about  05/20/17 appt. Mailed letter.

## 2017-04-15 DIAGNOSIS — R300 Dysuria: Secondary | ICD-10-CM | POA: Diagnosis not present

## 2017-04-15 DIAGNOSIS — Z681 Body mass index (BMI) 19 or less, adult: Secondary | ICD-10-CM | POA: Diagnosis not present

## 2017-04-15 DIAGNOSIS — R509 Fever, unspecified: Secondary | ICD-10-CM | POA: Diagnosis not present

## 2017-04-22 ENCOUNTER — Encounter: Payer: Self-pay | Admitting: Family

## 2017-04-22 ENCOUNTER — Ambulatory Visit (INDEPENDENT_AMBULATORY_CARE_PROVIDER_SITE_OTHER): Payer: Medicare HMO | Admitting: Family

## 2017-04-22 ENCOUNTER — Telehealth: Payer: Self-pay | Admitting: *Deleted

## 2017-04-22 ENCOUNTER — Other Ambulatory Visit: Payer: Self-pay

## 2017-04-22 VITALS — BP 142/75 | HR 71 | Temp 97.5°F | Resp 20 | Ht 70.0 in | Wt 163.0 lb

## 2017-04-22 DIAGNOSIS — M79675 Pain in left toe(s): Secondary | ICD-10-CM

## 2017-04-22 DIAGNOSIS — I714 Abdominal aortic aneurysm, without rupture, unspecified: Secondary | ICD-10-CM

## 2017-04-22 DIAGNOSIS — I771 Stricture of artery: Secondary | ICD-10-CM

## 2017-04-22 DIAGNOSIS — Z87891 Personal history of nicotine dependence: Secondary | ICD-10-CM | POA: Diagnosis not present

## 2017-04-22 DIAGNOSIS — Z95828 Presence of other vascular implants and grafts: Secondary | ICD-10-CM | POA: Diagnosis not present

## 2017-04-22 NOTE — Telephone Encounter (Signed)
Call from patient's daughter. C/o left 5th toe that "turns black" at times but color returns after he massages foot and toe.s/p EVAR 03/2017. Spoke with Dr. Trula Slade who asked patient be brought in today to be checked with [facility] .

## 2017-04-22 NOTE — Progress Notes (Signed)
[facility] OF [place] HISTORY AND PHYSICAL   CC: pain in left 5th toe x 1 week   History of Present Illness:   Justin Lyons is a 82 y.o. male who is s/p EVAR on 04-06-17 by Dr. Trula Slade.  Pt was evaluated in the ED on 04-06-17 for low back pain; CT demonstrated 5.4 cm AAA.  He also has asymptomatic left subclavian artery stenosis.  Pt returns today after call from patient's daughter. C/o left 5th toe that "turns black" at times but color returns after he massages foot and toe.s/p EVAR 04/07/2017. He denies injury to left 5th toe, which looks the same as right 5th toe which is not painful.   The pt denies any history of stroke or TIA, he denies back or abdominal pain, denies tingling, numbness, weakness, cold sensation, or pain in either hand or arm.  He denies claudication symptoms with walking. Pt indicates that his PCP heard a carotid bruit which is why he was referred for carotid Duplex monitoring.   Pt Diabetic: No Pt smoker: former smoker, quit in 1989  Pt meds include: Statin :Yes Betablocker: Yes ASA: Yes Other anticoagulants/antiplatelets: Plavix   Current Outpatient Medications  Medication Sig Dispense Refill  . allopurinol (ZYLOPRIM) 300 MG tablet Take 1 tablet (300 mg total) by mouth daily. 90 tablet 3  . aspirin 81 MG tablet Take 81 mg by mouth daily.     . Cholecalciferol (VITAMIN D) 1000 UNITS capsule Take 1,000 Units by mouth daily.      . clopidogrel (PLAVIX) 75 MG tablet Take 1 tablet (75 mg total) by mouth daily. 90 tablet 3  . indomethacin (INDOCIN) 25 MG capsule Take 1 capsule (25 mg total) by mouth 3 (three) times daily with meals. As needed for gout pain 90 capsule 5  . metoprolol (LOPRESSOR) 50 MG tablet Take 1 tablet by mouth  twice a day 180 tablet 3  . nitroGLYCERIN (NITROSTAT) 0.4 MG SL tablet Place 0.4 mg under the tongue every 5 (five) minutes as needed.      . pantoprazole (PROTONIX) 40 MG tablet Take 1 tablet (40 mg total) by  mouth daily. 90 tablet 3  . oxyCODONE-acetaminophen (PERCOCET/ROXICET) 5-325 MG tablet Take 1 tablet by mouth every 6 (six) hours as needed for moderate pain. (Patient not taking: Reported on 04/22/2017) 8 tablet 0  . simvastatin (ZOCOR) 40 MG tablet Take 1 tablet (40 mg total) by mouth every evening. 90 tablet 3   No current facility-administered medications for this visit.     Past Medical History:  Diagnosis Date  . AAA (abdominal aortic aneurysm) ([place])    06/02/2003 old BPG  . Arthritis   . Carotid artery occlusion   . Carotid bruit   . COPD (chronic obstructive pulmonary disease) ([place])   . Coronary artery disease   . Diverticulosis of colon   . Erectile dysfunction   . Hemorrhoids, internal   . History of compression fracture of spine   . History of gout   . Hyperlipidemia   . Hypertension   . Knee pain   . Myocardial infarction ([place]) 2005  . Peripheral vascular disease [facility])     Social History Social History   Tobacco Use  . Smoking status: Former Smoker    Packs/day: 1.50    Years: 20.00    Pack years: 30.00    Types: Cigarettes    Last attempt to quit: 01/22/1982    Years since quitting: 35.2  . Smokeless tobacco:  Never Used  Substance Use Topics  . Alcohol use: No  . Drug use: No    Family History Family History  Problem Relation Age of Onset  . Heart disease Father        Heart Disease before age 39  . Heart attack Father   . Cancer Mother        Colon  . Heart disease Mother   . Hyperlipidemia Mother   . Hypertension Mother   . Heart attack Mother   . Colon cancer Mother   . Cancer Brother        lung cancer    Surgical History Past Surgical History:  Procedure Laterality Date  . ABDOMINAL AORTIC ENDOVASCULAR STENT GRAFT N/A 04/07/2017   Procedure: ENDOVASCULAR ABDOMINAL ANEURYSM REPAIR;  Surgeon: Serafina Mitchell, MD;  Location: [place];  Service: Vascular;  Laterality: N/A;  . CORONARY ANGIOPLASTY  2005    Allergies  Allergen Reactions  .  Doxycycline Rash  . Penicillins Rash  . Sulfonamide Derivatives Rash    Current Outpatient Medications  Medication Sig Dispense Refill  . allopurinol (ZYLOPRIM) 300 MG tablet Take 1 tablet (300 mg total) by mouth daily. 90 tablet 3  . aspirin 81 MG tablet Take 81 mg by mouth daily.     . Cholecalciferol (VITAMIN D) 1000 UNITS capsule Take 1,000 Units by mouth daily.      . clopidogrel (PLAVIX) 75 MG tablet Take 1 tablet (75 mg total) by mouth daily. 90 tablet 3  . indomethacin (INDOCIN) 25 MG capsule Take 1 capsule (25 mg total) by mouth 3 (three) times daily with meals. As needed for gout pain 90 capsule 5  . metoprolol (LOPRESSOR) 50 MG tablet Take 1 tablet by mouth  twice a day 180 tablet 3  . nitroGLYCERIN (NITROSTAT) 0.4 MG SL tablet Place 0.4 mg under the tongue every 5 (five) minutes as needed.      . pantoprazole (PROTONIX) 40 MG tablet Take 1 tablet (40 mg total) by mouth daily. 90 tablet 3  . oxyCODONE-acetaminophen (PERCOCET/ROXICET) 5-325 MG tablet Take 1 tablet by mouth every 6 (six) hours as needed for moderate pain. (Patient not taking: Reported on 04/22/2017) 8 tablet 0  . simvastatin (ZOCOR) 40 MG tablet Take 1 tablet (40 mg total) by mouth every evening. 90 tablet 3   No current facility-administered medications for this visit.      REVIEW OF SYSTEMS: See HPI for pertinent positives and negatives.  Physical Examination Vitals:   04/22/17 1552  BP: (!) 142/75  Pulse: 71  Resp: 20  Temp: (!) 97.5 F (36.4 C)  TempSrc: Oral  SpO2: 97%  Weight: 163 lb (73.9 kg)  Height: 5\' 10"  (1.778 m)   Body mass index is 23.39 kg/m.  General:  WDWN male in NAD Gait: Normal HENT: WNL Eyes: Pupils equal, round, and react to light Pulmonary: normal non-labored breathing, good air movement in all fields,no rales, rhonchi, or wheezing Cardiac: Regular rhythm and rate, no murmur detected Abdomen: soft, NT, no masses palpated Skin: no rashes, no ulcers, no cellulitis.    VASCULAR EXAM  Carotid Bruits Right Left   Negative Positive   Abdominal aortic pulse is palpable Radial pulses are 2+ palpable and =.   Left foot    VASCULAR EXAM: Extremitieswithout ischemic changes,  without Gangrene, no open wounds.All toes of both feet are cool with mild dusky appearance. Some erythema at the base of left 5th toe.      LE Pulses  Right Left   FEMORAL faintly palpable faintly palpable    POPLITEAL 2+ palpable  1+ palpable   POSTERIOR TIBIAL 2+palpable  2+ palpable    DORSALIS PEDIS  ANTERIOR TIBIAL 2+ palpable  2+ palpable     Musculoskeletal: no muscle wasting or atrophy; no peripheral edema. Neurologic:  A&O X 3; appropriate affect, sensation is normal; speech is normal, CN 2-12 intact except is hard of hearing, pain and light touch intact in extremities, motor exam as listed above. Psychiatric: Normal thought content, mood appropriate to clinical situation.    ASSESSMENT:  Justin Lyons is a 82 y.o. male who is s/p EVAR on 04-06-17 by Dr. Trula Slade.  He also has asymptomatic left subclavian artery stenosis.He has no steal symptoms in either upper extremity.  He denies back or abdominal pain.  His complaint today is one week hx of left 5th toe pain; he denies injury to left 5th toe. All toes of both feet are cool with mild dusky appearance; left 5th toe appears the same as all toes of both feet, except for some mild erythema at the base of toenail . However, bilateral PT and DP pulses are 2+ palpable.  Will refer t podiatrist in [facility] for evaluation of left 5th toes pain which does not appear to be ischemic in origin; will obtain ABI's on is return to see Dr. Trula Slade on 05-20-17.  He is scheduled to have a post EVAR  CTA abd/pelvis on 05-13-17.   He does not have DM and quit smoking in 1989.   DATA  Carotid Duplex (01-04-17): Bilateral ICA stenosis <40%. Right vertebral artery flow is antegrade, right subclavian artery waveforms are normal. Left vertebral artery flow is retrograde, left subclavian artery waveforms are stenotic.  No significant change in comparison to exams on 12-24-14 and 12-30-15.   AAA Duplex (01-04-17): Patent abdominal aortic aneurysm measuring approximately 4.10 cm at the largest diameter. Right CIA: 1.76 cm; Left CIA: 1.31 cm No significant change compared to 4.12 cm AAA measured on 12-30-15.  Bilateral prominent popliteal pulses: duplexexam on 01-28-15:no aneurysmal dilatationof popliteal arteries.   PLAN:   ABI's on 05-20-17 before he sees Dr. Trula Slade that date for CTA abd/pelvis (05-13-17) s/p EVAR.  Referral to podiatrist in [facility] for evaluation left 5th toe pain, that is non ischemic.   I discussed in depth with the patient the nature of atherosclerosis, and emphasized the importance of maximal medical management including strict control of blood pressure, blood glucose, and lipid levels, obtaining regular exercise, and cessation of smoking.  The patient is aware that without maximal medical management the underlying atherosclerotic disease process will progress, limiting the benefit of any interventions.  Thank you for allowing [place] to participate in this patient's care.  Clemon Chambers, RN, MSN, FNP-C [facility] Office: [phone]  Clinic MD: Trula Slade 04/22/2017 4:48 PM     .

## 2017-04-26 ENCOUNTER — Other Ambulatory Visit: Payer: Self-pay

## 2017-04-26 DIAGNOSIS — I739 Peripheral vascular disease, unspecified: Secondary | ICD-10-CM

## 2017-04-26 DIAGNOSIS — M79675 Pain in left toe(s): Secondary | ICD-10-CM

## 2017-05-13 ENCOUNTER — Ambulatory Visit (HOSPITAL_COMMUNITY)
Admission: RE | Admit: 2017-05-13 | Discharge: 2017-05-13 | Disposition: A | Discharge status: Home / Self Care | Payer: Medicare HMO | Source: Ambulatory Visit | Attending: Vascular Surgery | Admitting: Vascular Surgery

## 2017-05-13 ENCOUNTER — Ambulatory Visit
Admission: RE | Admit: 2017-05-13 | Discharge: 2017-05-13 | Disposition: A | Discharge status: Home / Self Care | Payer: Medicare HMO | Source: Ambulatory Visit | Attending: Surgery | Admitting: Surgery

## 2017-05-13 DIAGNOSIS — K4090 Unilateral inguinal hernia, without obstruction or gangrene, not specified as recurrent: Secondary | ICD-10-CM | POA: Diagnosis not present

## 2017-05-13 DIAGNOSIS — I739 Peripheral vascular disease, unspecified: Secondary | ICD-10-CM | POA: Insufficient documentation

## 2017-05-13 DIAGNOSIS — M79675 Pain in left toe(s): Secondary | ICD-10-CM | POA: Diagnosis not present

## 2017-05-13 DIAGNOSIS — I714 Abdominal aortic aneurysm, without rupture, unspecified: Secondary | ICD-10-CM

## 2017-05-13 DIAGNOSIS — Z9889 Other specified postprocedural states: Secondary | ICD-10-CM | POA: Diagnosis not present

## 2017-05-13 DIAGNOSIS — Z48812 Encounter for surgical aftercare following surgery on the circulatory system: Secondary | ICD-10-CM

## 2017-05-13 MED ORDER — IOPAMIDOL (ISOVUE-370) INJECTION 76%
75.0000 mL | Freq: Once | INTRAVENOUS | Status: AC | PRN
  Administered 2017-05-13: 75 mL via INTRAVENOUS

## 2017-05-20 ENCOUNTER — Encounter: Payer: Self-pay | Admitting: Surgery

## 2017-05-20 ENCOUNTER — Ambulatory Visit (INDEPENDENT_AMBULATORY_CARE_PROVIDER_SITE_OTHER): Payer: Self-pay | Admitting: Surgery

## 2017-05-20 VITALS — BP 176/82 | HR 63 | Temp 97.2°F | Resp 18 | Ht 70.0 in | Wt 163.8 lb

## 2017-05-20 DIAGNOSIS — I714 Abdominal aortic aneurysm, without rupture, unspecified: Secondary | ICD-10-CM

## 2017-05-20 NOTE — Progress Notes (Signed)
Patient name: Justin Lyons MRN: [id number] DOB: 1935/02/16 Sex: male  REASON FOR VISIT:     post op  HISTORY OF PRESENT ILLNESS:   FRANSICO SCIANDRA is a 82 y.o. male who has been followed at our office for many years for an abdominal aortic aneurysm.  He presented to the emergency department with pain.  CT scan showed a 5.4 cm aneurysm with radiographic comments that the aneurysm is at risk for rupture.  A month earlier ultrasound our office showed a 4.1 cm aneurysm.  I elected to taken to the operating room on 04/07/2017 for endovascular repair.  His repair was uncomplicated.  He was seen in the office with discoloration in his left fifth toe approximately 2 weeks after his operation   Patient has a known left subclavian artery stenosis.  He suffers from COPD from chronic tobacco abuse having quit smoking in 1989.  He takes a statin for hypercholesterolemia.  He is on dual antiplatelet therapy with aspirin and Plavix.  His blood pressure is medically managed.  His most recent carotid duplex showed less than 40% stenosis bilaterally.    CURRENT MEDICATIONS:    Current Outpatient Medications  Medication Sig Dispense Refill  . allopurinol (ZYLOPRIM) 300 MG tablet Take 1 tablet (300 mg total) by mouth daily. 90 tablet 3  . aspirin 81 MG tablet Take 81 mg by mouth daily.     . Cholecalciferol (VITAMIN D) 1000 UNITS capsule Take 1,000 Units by mouth daily.      . clopidogrel (PLAVIX) 75 MG tablet Take 1 tablet (75 mg total) by mouth daily. 90 tablet 3  . indomethacin (INDOCIN) 25 MG capsule Take 1 capsule (25 mg total) by mouth 3 (three) times daily with meals. As needed for gout pain 90 capsule 5  . metoprolol (LOPRESSOR) 50 MG tablet Take 1 tablet by mouth  twice a day 180 tablet 3  . nitroGLYCERIN (NITROSTAT) 0.4 MG SL tablet Place 0.4 mg under the tongue every 5 (five) minutes as needed.      [name] oxyCODONE-acetaminophen (PERCOCET/ROXICET) 5-325 MG tablet  Take 1 tablet by mouth every 6 (six) hours as needed for moderate pain. 8 tablet 0  . pantoprazole (PROTONIX) 40 MG tablet Take 1 tablet (40 mg total) by mouth daily. 90 tablet 3  . simvastatin (ZOCOR) 40 MG tablet Take 1 tablet (40 mg total) by mouth every evening. 90 tablet 3   No current facility-administered medications for this visit.     REVIEW OF SYSTEMS:   [X]  denotes positive finding, [ ]  denotes negative finding Cardiac  Comments:  Chest pain or chest pressure:    Shortness of breath upon exertion:    Short of breath when lying flat:    Irregular heart rhythm:    Constitutional    Fever or chills:      PHYSICAL EXAM:   Vitals:   05/20/17 1016 05/20/17 1019  BP: (!) 142/74 (!) 176/82  Pulse: 63   Resp: 18   Temp: (!) 97.2 F (36.2 C)   TempSrc: Oral   SpO2: 98%   Weight: 163 lb 12.8 oz (74.3 kg)   Height: 5\' 10"  (1.778 m)     GENERAL: The patient is a well-nourished male, in no acute distress. The vital signs are documented above. CARDIOVASCULAR: There is a regular rate and rhythm. PULMONARY: Non-labored respirations Palpable dorsalis pedis pulses bilaterally. Dry gangrene to a small area of the left fifth toe  STUDIES:   CTA:  Status  post aorto bi-iliac stent graft placement. There is no evidence of endoleak. Maximal aneurysm sac diameter has decreased from 5.4 cm to 4.9 cm. Stranding anterior to the lower abdominal aorta has also resolved. These were likely acute findings related to atherosclerosis which have stabilized.  Significant narrowing at the origins of the bilateral internal iliac arteries.  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 This is a stable finding.  NON-VASCULAR  Chronic findings.   MEDICAL ISSUES:   AAA: Successful exclusion of aneurysm with decrease in size of the sac and no evidence of endoleak.  He will follow-up in 6 months with ultrasound  Embolic event to left fifth toe: This was most  likely related to his aneurysm repair.  There is just a small area of eschar on the bottom of his left fifth toe.  We discussed letting this demarcate.  Hopefully it will scab off and heal on its own.  He potentially could require amputation.  He is going to contact me if there are any changes in the wound.  Carotid duplex: No evidence of bilateral stenosis.  Annamarie Major, MD [facility] of [facility] [phone] Pager [phone]

## 2017-05-31 ENCOUNTER — Other Ambulatory Visit: Payer: Self-pay

## 2017-05-31 DIAGNOSIS — I714 Abdominal aortic aneurysm, without rupture, unspecified: Secondary | ICD-10-CM

## 2017-06-11 DIAGNOSIS — Z6825 Body mass index (BMI) 25.0-25.9, adult: Secondary | ICD-10-CM | POA: Diagnosis not present

## 2017-06-11 DIAGNOSIS — D485 Neoplasm of uncertain behavior of skin: Secondary | ICD-10-CM | POA: Diagnosis not present

## 2017-06-27 DIAGNOSIS — C44519 Basal cell carcinoma of skin of other part of trunk: Secondary | ICD-10-CM | POA: Diagnosis not present

## 2017-06-27 DIAGNOSIS — L570 Actinic keratosis: Secondary | ICD-10-CM | POA: Diagnosis not present

## 2017-06-27 DIAGNOSIS — C44219 Basal cell carcinoma of skin of left ear and external auricular canal: Secondary | ICD-10-CM | POA: Diagnosis not present

## 2017-06-27 DIAGNOSIS — C4441 Basal cell carcinoma of skin of scalp and neck: Secondary | ICD-10-CM | POA: Diagnosis not present

## 2017-08-06 DIAGNOSIS — C4441 Basal cell carcinoma of skin of scalp and neck: Secondary | ICD-10-CM | POA: Diagnosis not present

## 2017-08-06 DIAGNOSIS — Z85828 Personal history of other malignant neoplasm of skin: Secondary | ICD-10-CM | POA: Diagnosis not present

## 2017-08-13 DIAGNOSIS — Z85828 Personal history of other malignant neoplasm of skin: Secondary | ICD-10-CM | POA: Diagnosis not present

## 2017-08-13 DIAGNOSIS — C44212 Basal cell carcinoma of skin of right ear and external auricular canal: Secondary | ICD-10-CM | POA: Diagnosis not present

## 2017-08-27 DIAGNOSIS — Z4802 Encounter for removal of sutures: Secondary | ICD-10-CM | POA: Diagnosis not present

## 2017-09-04 DIAGNOSIS — R05 Cough: Secondary | ICD-10-CM | POA: Diagnosis not present

## 2017-09-04 DIAGNOSIS — J209 Acute bronchitis, unspecified: Secondary | ICD-10-CM | POA: Diagnosis not present

## 2017-09-04 DIAGNOSIS — Z6825 Body mass index (BMI) 25.0-25.9, adult: Secondary | ICD-10-CM | POA: Diagnosis not present

## 2017-11-04 ENCOUNTER — Other Ambulatory Visit (HOSPITAL_COMMUNITY): Payer: Medicare HMO

## 2017-11-04 ENCOUNTER — Ambulatory Visit: Payer: Medicare HMO | Admitting: Surgery

## 2017-11-25 ENCOUNTER — Ambulatory Visit (INDEPENDENT_AMBULATORY_CARE_PROVIDER_SITE_OTHER): Payer: Medicare HMO | Admitting: Surgery

## 2017-11-25 ENCOUNTER — Other Ambulatory Visit: Payer: Self-pay

## 2017-11-25 ENCOUNTER — Ambulatory Visit (HOSPITAL_COMMUNITY)
Admission: RE | Admit: 2017-11-25 | Discharge: 2017-11-25 | Disposition: A | Discharge status: Home / Self Care | Payer: Medicare HMO | Source: Ambulatory Visit | Attending: Surgery | Admitting: Surgery

## 2017-11-25 ENCOUNTER — Encounter: Payer: Self-pay | Admitting: Surgery

## 2017-11-25 VITALS — BP 149/67 | HR 56 | Resp 18 | Ht 70.0 in | Wt 166.8 lb

## 2017-11-25 DIAGNOSIS — I714 Abdominal aortic aneurysm, without rupture, unspecified: Secondary | ICD-10-CM

## 2017-11-25 NOTE — Progress Notes (Signed)
Vascular [facility] Specialist of [place]  Patient name: Justin Lyons MRN: [id number] DOB: 04/23/1935 Sex: male   REASON FOR VISIT:    Follow up  HISOTRY OF PRESENT ILLNESS:    Justin Lyons is a 82 y.o. male who has been followed at our office for many years for an abdominal aortic aneurysm.  He presented to the emergency department with pain.  CT scan showed a 5.4 cm aneurysm with radiographic comments that the aneurysm is at risk for rupture.  A month earlier ultrasound our office showed a 4.1 cm aneurysm.  I elected to taken to the operating room on 04/07/2017 for endovascular repair.  His repair was uncomplicated. His post operative CT scan showed successful exclusion of aneurysm with no evidence of endoleak.  He was seen in the office with discoloration in his left fifth toe approximately 2 weeks after his operation.  This was most likely felt to be an embolic event from his aneurysm repair.  The eschar has fallen off and he has had no further issues.  He denies abdominal pain or leg pain.   Patient has a known left subclavian artery stenosis. He suffers from COPD from chronic tobacco abuse having quit smoking in 1989.He takes a statin for hypercholesterolemia. He is on dual antiplatelet therapy with aspirin and Plavix. His blood pressure is medically managed. His most recent carotid duplex showed less than 40% stenosis bilaterally.   PAST MEDICAL HISTORY:   Past Medical History:  Diagnosis Date  . AAA (abdominal aortic aneurysm) ([place])    06/02/2003 old BPG  . Arthritis   . Carotid artery occlusion   . Carotid bruit   . COPD (chronic obstructive pulmonary disease) ([place])   . Coronary artery disease   . Diverticulosis of colon   . Erectile dysfunction   . Hemorrhoids, internal   . History of compression fracture of spine   . History of gout   . Hyperlipidemia   . Hypertension   . Knee pain   . Myocardial infarction ([place]) 2005    . Peripheral vascular disease ([place])      FAMILY HISTORY:   Family History  Problem Relation Age of Onset  . Heart disease Father        Heart Disease before age 77  . Heart attack Father   . Cancer Mother        Colon  . Heart disease Mother   . Hyperlipidemia Mother   . Hypertension Mother   . Heart attack Mother   . Colon cancer Mother   . Cancer Brother        lung cancer    SOCIAL HISTORY:   Social History   Tobacco Use  . Smoking status: Former Smoker    Packs/day: 1.50    Years: 20.00    Pack years: 30.00    Types: Cigarettes    Last attempt to quit: 01/22/1982    Years since quitting: 35.8  . Smokeless tobacco: Never Used  Substance Use Topics  . Alcohol use: No     ALLERGIES:   Allergies  Allergen Reactions  . Doxycycline Rash  . Penicillins Rash  . Sulfonamide Derivatives Rash     CURRENT MEDICATIONS:   Current Outpatient Medications  Medication Sig Dispense Refill  . allopurinol (ZYLOPRIM) 300 MG tablet Take 1 tablet (300 mg total) by mouth daily. 90 tablet 3  . aspirin 81 MG tablet Take 81 mg by mouth daily.     . Cholecalciferol (VITAMIN  D) 1000 UNITS capsule Take 1,000 Units by mouth daily.      . clopidogrel (PLAVIX) 75 MG tablet Take 1 tablet (75 mg total) by mouth daily. 90 tablet 3  . indomethacin (INDOCIN) 25 MG capsule Take 1 capsule (25 mg total) by mouth 3 (three) times daily with meals. As needed for gout pain 90 capsule 5  . metoprolol (LOPRESSOR) 50 MG tablet Take 1 tablet by mouth  twice a day 180 tablet 3  . nitroGLYCERIN (NITROSTAT) 0.4 MG SL tablet Place 0.4 mg under the tongue every 5 (five) minutes as needed.      [name] oxyCODONE-acetaminophen (PERCOCET/ROXICET) 5-325 MG tablet Take 1 tablet by mouth every 6 (six) hours as needed for moderate pain. 8 tablet 0  . pantoprazole (PROTONIX) 40 MG tablet Take 1 tablet (40 mg total) by mouth daily. 90 tablet 3  . simvastatin (ZOCOR) 40 MG tablet Take 1 tablet (40 mg total) by mouth  every evening. 90 tablet 3   No current facility-administered medications for this visit.     REVIEW OF SYSTEMS:   [X]  denotes positive finding, [ ]  denotes negative finding Cardiac  Comments:  Chest pain or chest pressure:    Shortness of breath upon exertion:    Short of breath when lying flat:    Irregular heart rhythm:        Vascular    Pain in calf, thigh, or hip brought on by ambulation:    Pain in feet at night that wakes you up from your sleep:     Blood clot in your veins:    Leg swelling:         Pulmonary    Oxygen at home:    Productive cough:     Wheezing:         Neurologic    Sudden weakness in arms or legs:     Sudden numbness in arms or legs:     Sudden onset of difficulty speaking or slurred speech:    Temporary loss of vision in one eye:     Problems with dizziness:         Gastrointestinal    Blood in stool:     Vomited blood:         Genitourinary    Burning when urinating:     Blood in urine:        Psychiatric    Major depression:         Hematologic    Bleeding problems:    Problems with blood clotting too easily:        Skin    Rashes or ulcers:        Constitutional    Fever or chills:      PHYSICAL EXAM:   Vitals:   11/25/17 0832  BP: (!) 149/67  Pulse: (!) 56  Resp: 18  SpO2: 96%  Weight: 166 lb 12.8 oz (75.7 kg)  Height: 5\' 10"  (1.778 m)    GENERAL: The patient is a well-nourished male, in no acute distress. The vital signs are documented above. CARDIAC: There is a regular rate and rhythm.  VASCULAR: Palpable pedal pulse PULMONARY: Non-labored respirations ABDOMEN: Soft and non-tender with normal pitched bowel sounds.  MUSCULOSKELETAL: There are no major deformities or cyanosis. NEUROLOGIC: No focal weakness or paresthesias are detected. SKIN: There are no ulcers or rashes noted.  The eschar on the left fifth toe has resolved PSYCHIATRIC: The patient has a normal affect.  STUDIES:  I have reviewed and ordered  his ultrasound with the following findings:  Abdominal Aorta: Patent endovascular aneurysm repair with no evidence of endoleak. The largest aortic diameter has decreased compared to prior exam. Previous diameter measurement was 4.9 cm obtained on 05/13/2017 CT.  MEDICAL ISSUES:   AAA: He will follow-up in 6 months for a ultrasound.  If his diameter continues to decrease, he can go to annual surveillance  Carotid: He has had multiple carotid ultrasounds that have not shown any significant carotid stenosis.  He does have a left subclavian stenosis.  I told him that we could stop getting carotid ultrasounds for several years.  He understands that if he develops symptoms in his left arm that we can further evaluate this.  He knows that blood pressure measurements in his left arm are not accurate.    Annamarie Major, MD [facility] of [facility] [phone] Pager [phone]

## 2017-12-10 DIAGNOSIS — Z6825 Body mass index (BMI) 25.0-25.9, adult: Secondary | ICD-10-CM | POA: Diagnosis not present

## 2017-12-10 DIAGNOSIS — R062 Wheezing: Secondary | ICD-10-CM | POA: Diagnosis not present

## 2017-12-10 DIAGNOSIS — J441 Chronic obstructive pulmonary disease with (acute) exacerbation: Secondary | ICD-10-CM | POA: Diagnosis not present

## 2017-12-16 DIAGNOSIS — Z23 Encounter for immunization: Secondary | ICD-10-CM | POA: Diagnosis not present

## 2018-01-17 ENCOUNTER — Encounter: Payer: Self-pay | Admitting: Cardiology

## 2018-01-17 ENCOUNTER — Encounter: Payer: Self-pay | Admitting: *Deleted

## 2018-01-17 ENCOUNTER — Ambulatory Visit: Payer: Medicare HMO | Admitting: Cardiology

## 2018-01-17 VITALS — BP 146/74 | HR 77 | Ht 70.0 in | Wt 165.0 lb

## 2018-01-17 DIAGNOSIS — E785 Hyperlipidemia, unspecified: Secondary | ICD-10-CM

## 2018-01-17 DIAGNOSIS — I10 Essential (primary) hypertension: Secondary | ICD-10-CM

## 2018-01-17 DIAGNOSIS — I2510 Atherosclerotic heart disease of native coronary artery without angina pectoris: Secondary | ICD-10-CM

## 2018-01-17 MED ORDER — LISINOPRIL 5 MG PO TABS: 5.0000 mg | ORAL_TABLET | Freq: Every day | ORAL | 6 refills | Status: DC

## 2018-01-17 NOTE — Patient Instructions (Signed)
Medication Instructions:   Begin Lisinopril 5mg  daily.   Continue all other medications.    Labwork:  BMET, FLP - orders given today.   Reminder:  Nothing to eat or drink after 12 midnight prior to labs.  Please do in 2 weeks.    Office will contact with results via phone or letter.    Testing/Procedures: none  Follow-Up: Your physician wants you to follow up in: 6 months.  You will receive a reminder letter in the mail one-two months in advance.  If you don't receive a letter, please call our office to schedule the follow up appointment   Any Other Special Instructions Will Be Listed Below (If Applicable).  If you need a refill on your cardiac medications before your next appointment, please call your pharmacy.

## 2018-01-17 NOTE — Progress Notes (Signed)
Clinical Summary Justin Lyons is a 82 y.o.male seen as new consult. Last seen by DR Justin Lyons in 2011  1. CAD - remote history of caths somewhat unclear. From available clinic notes DES to LCX in 2005. Notes mention prior to that a remote history of PTCA to RCA - no recent chest pain. Recent SOB he attributes to his COPD, improving with treatment - compliant with meds. Has been on plavix several years, it appears since 2005. This was continued through his follow ups, looks like last cardiology clinic visit was 2011.    2. AAA - s/p endovascular repair - followed by vascular  3. Carotid stenosis - 2018 mild bilaterally.    4. COPD - managed by pcp, recent exacerbation  5. HTN - compliant with meds.   6. Hyperlipidemia - no recent panel in our system.  Past Medical History:  Diagnosis Date  . AAA (abdominal aortic aneurysm) ([place])    06/02/2003 old BPG  . Arthritis   . Carotid artery occlusion   . Carotid bruit   . COPD (chronic obstructive pulmonary disease) ([place])   . Coronary artery disease   . Diverticulosis of colon   . Erectile dysfunction   . Hemorrhoids, internal   . History of compression fracture of spine   . History of gout   . Hyperlipidemia   . Hypertension   . Knee pain   . Myocardial infarction ([place]) 2005  . Peripheral vascular disease (HCC)      Allergies  Allergen Reactions  . Doxycycline Rash  . Penicillins Rash  . Sulfonamide Derivatives Rash     Current Outpatient Medications  Medication Sig Dispense Refill  . allopurinol (ZYLOPRIM) 300 MG tablet Take 1 tablet (300 mg total) by mouth daily. 90 tablet 3  . aspirin 81 MG tablet Take 81 mg by mouth daily.     . Cholecalciferol (VITAMIN D) 1000 UNITS capsule Take 1,000 Units by mouth daily.      . clopidogrel (PLAVIX) 75 MG tablet Take 1 tablet (75 mg total) by mouth daily. 90 tablet 3  . indomethacin (INDOCIN) 25 MG capsule Take 1 capsule (25 mg total) by mouth 3 (three) times daily with  meals. As needed for gout pain 90 capsule 5  . metoprolol (LOPRESSOR) 50 MG tablet Take 1 tablet by mouth  twice a day 180 tablet 3  . nitroGLYCERIN (NITROSTAT) 0.4 MG SL tablet Place 0.4 mg under the tongue every 5 (five) minutes as needed.      [name] oxyCODONE-acetaminophen (PERCOCET/ROXICET) 5-325 MG tablet Take 1 tablet by mouth every 6 (six) hours as needed for moderate pain. 8 tablet 0  . pantoprazole (PROTONIX) 40 MG tablet Take 1 tablet (40 mg total) by mouth daily. 90 tablet 3  . simvastatin (ZOCOR) 40 MG tablet Take 1 tablet (40 mg total) by mouth every evening. 90 tablet 3   No current facility-administered medications for this visit.      Past Surgical History:  Procedure Laterality Date  . ABDOMINAL AORTIC ENDOVASCULAR STENT GRAFT N/A 04/07/2017   Procedure: ENDOVASCULAR ABDOMINAL ANEURYSM REPAIR;  Surgeon: Serafina Mitchell, MD;  Location: [facility];  Service: Vascular;  Laterality: N/A;  . CORONARY ANGIOPLASTY  2005     Allergies  Allergen Reactions  . Doxycycline Rash  . Penicillins Rash  . Sulfonamide Derivatives Rash      Family History  Problem Relation Age of Onset  . Heart disease Father        Heart  Disease before age 78  . Heart attack Father   . Cancer Mother        Colon  . Heart disease Mother   . Hyperlipidemia Mother   . Hypertension Mother   . Heart attack Mother   . Colon cancer Mother   . Cancer Brother        lung cancer     Social History Justin Lyons reports that he quit smoking about 36 years ago. His smoking use included cigarettes. He has a 30.00 pack-year smoking history. He has never used smokeless tobacco. Justin Lyons reports no history of alcohol use.   Review of Systems CONSTITUTIONAL: No weight loss, fever, chills, weakness or fatigue.  HEENT: Eyes: No visual loss, blurred vision, double vision or yellow sclerae.No hearing loss, sneezing, congestion, runny nose or sore throat.  SKIN: No rash or itching.  CARDIOVASCULAR: per  hpi RESPIRATORY: No shortness of breath, cough or sputum.  GASTROINTESTINAL: No anorexia, nausea, vomiting or diarrhea. No abdominal pain or blood.  GENITOURINARY: No burning on urination, no polyuria NEUROLOGICAL: No headache, dizziness, syncope, paralysis, ataxia, numbness or tingling in the extremities. No change in bowel or bladder control.  MUSCULOSKELETAL: No muscle, back pain, joint pain or stiffness.  LYMPHATICS: No enlarged nodes. No history of splenectomy.  PSYCHIATRIC: No history of depression or anxiety.  ENDOCRINOLOGIC: No reports of sweating, cold or heat intolerance. No polyuria or polydipsia.  [name]   Physical Examination Vitals:   01/17/18 0839 01/17/18 0841  BP: (!) 150/78 (!) 146/74  Pulse: 77   SpO2: 95%    Vitals:   01/17/18 0839  Weight: 165 lb (74.8 kg)  Height: 5\' 10"  (1.778 m)    Gen: resting comfortably, no acute distress HEENT: no scleral icterus, pupils equal round and reactive, no palptable cervical adenopathy,  CV: RRR, no m/r/g, no jvd. +bilateral carotid bruits Resp: Clear to auscultation bilaterally GI: abdomen is soft, non-tender, non-distended, normal bowel sounds, no hepatosplenomegaly MSK: extremities are warm, no edema.  Skin: warm, no rash Neuro:  no focal deficits Psych: appropriate affect   Diagnostic Studies  08/2003 cath CLINICAL HISTORY:  Justin Lyons is 82 years old and had a remote PTCA of the  right coronary artery by Dr. Shelva Lyons and was admitted yesterday with  chest pain and minimal ST elevation in the inferior leads.  He was studied  by Dr. Ethelle Lyons and found to have a chronically totally occluded  right coronary artery which was small, and 80% to 90% stenosis near the  ostium of the circumflex artery with 70% to 80% ostial stenosis of the first  marginal Justin Lyons.  Dr. Albertine Lyons stented the proximal circumflex artery with a  TAXUS stent, feeling this was the culprit vessel.  The patient developed  some recurrent chest  discomfort last night and today, and for this reason  was brought back to the laboratory for reevaluation.  His EKG showed slight  inferior ST elevation.   PROCEDURE:  The procedure was performed via the left femoral artery using  arterial sheath and a left diagnostic catheter.  Femoral arterial puncture  was performed and Omnipaque contrast was used.  The left femoral artery was  closed with AngioSeal at the end of the procedure.  The patient tolerated  the procedure well and left the laboratory in satisfactory condition.   RESULTS:  The stent in the proximal circumflex artery which crossed the  first marginal Quanisha Drewry was widely patent with no stenosis.  The first  marginal Keenan Trefry was narrowed at the ostium to 70% to 80%.  This appeared  better than on the immediate post-stenting film from yesterday.  There was  also a 30% to 40% narrowing downstream in the circumflex artery.   CONCLUSION:  Widely patent stent in the proximal circumflex artery with 80%  narrowing in a side Rael Yo, but with brisk TIMI-3 flow.   RECOMMENDATIONS:  The stent is widely patent and the side Criss Pallone has brisk  TIMI flow and the ostial stenosis appears better than the post-procedure  films.  I think it is very unlikely that the patient's symptoms are related  to the marginal Shalon Salado narrowing.  His symptoms may be related to his Plavix  and we will plan to treat him with Pepcid and continued observation.   01/9620 cath COMPLICATIONS:  None.   FINDINGS:  1. LV 114/5/13.  EF approximately 60% without regional wall motion     abnormality in an RAO projection.  2. No aortic stenosis or mitral regurgitation.  3. Left main:  Angiographically normal.  4. LAD:  Large vessel wrapping the apex.  There is a 50% stenosis of the     proximal vessel.  5. Circumflex:  Hazy, approximately 80% stenosis of the proximal vessel     involving the takeoff of moderate sized first obtuse marginal.  This     first marginal had  an 80% stenosis.  The AV groove circumflex was stented     to no residual stenosis.  There remained a 70% stenosis in the obtuse     marginal.  TIMI 3 flow was maintained in both the circumflex proper and     the first obtuse marginal.  6. RCA:  Moderate sized, dominant vessel.  There is a total occlusion of the     mid vessel after the takeoff of the acute marginal.  There are bridging     collaterals and modest left to right collaterals.  The bridging     collaterals are very suggestive of chronic total occlusion.  Films are     reviewed with Dr. Olevia Perches who agreed.   IMPRESSION/RECOMMENDATIONS:  Successful drug-eluting stent placement in the  culprit lesion of the proximal circumflex.  The patient tolerated the  procedure well.  Eptifibatide will be continued for 18 hours.  Sheaths will  be removed when the ACT is less than 175 seconds.   The patient has agreed to participate in the TRITON study comparing Plavix  with a novel thienopyridine.  He will be continued on this study drug for  approximately one year.  He, thus, should not receive any Plavix through  that period.  Aspirin will be continued indefinitely.   Assessment and Plan  1. CAD - no recent symptoms - has been on DAPT since 2005 it appears, I cannot find documentation indicating the intention and indication for long term DAPT. Review records further prior to stopping - EKG today SR, no acute ischemic changes   2. HTN - above goal. Given his CAD history and elevated bp start lisinopril 5mg  daily, check BMET in 2 weeks  3. Hyperlipidemia - has been on zocor. Recheck lipid panel, likely change to atorva 40-80mg  based on most recent lipid guidelines and his history of CAD  F/u 6 months    Arnoldo Lenis, M.D.

## 2018-01-29 ENCOUNTER — Other Ambulatory Visit: Payer: Self-pay | Admitting: Cardiology

## 2018-01-29 MED ORDER — LISINOPRIL 5 MG PO TABS: 5.0000 mg | ORAL_TABLET | Freq: Every day | ORAL | 2 refills | Status: DC

## 2018-01-29 NOTE — Telephone Encounter (Signed)
°*  STAT* If patient is at the pharmacy, call can be transferred to refill team.   1. Which medications need to be refilled?   lisinopril (PRINIVIL,ZESTRIL) 5 MG tablet    2. Which pharmacy/location (including street and city if local pharmacy) is medication to be sent to? [facility] mail order  3. Do they need a 30 day or 90 day supply?

## 2018-02-06 DIAGNOSIS — J449 Chronic obstructive pulmonary disease, unspecified: Secondary | ICD-10-CM | POA: Diagnosis not present

## 2018-02-06 DIAGNOSIS — I10 Essential (primary) hypertension: Secondary | ICD-10-CM | POA: Diagnosis not present

## 2018-02-06 DIAGNOSIS — E782 Mixed hyperlipidemia: Secondary | ICD-10-CM | POA: Diagnosis not present

## 2018-09-01 DIAGNOSIS — D225 Melanocytic nevi of trunk: Secondary | ICD-10-CM | POA: Diagnosis not present

## 2018-09-01 DIAGNOSIS — D044 Carcinoma in situ of skin of scalp and neck: Secondary | ICD-10-CM | POA: Diagnosis not present

## 2018-09-01 DIAGNOSIS — D1801 Hemangioma of skin and subcutaneous tissue: Secondary | ICD-10-CM | POA: Diagnosis not present

## 2018-09-01 DIAGNOSIS — L821 Other seborrheic keratosis: Secondary | ICD-10-CM | POA: Diagnosis not present

## 2018-09-01 DIAGNOSIS — Z85828 Personal history of other malignant neoplasm of skin: Secondary | ICD-10-CM | POA: Diagnosis not present

## 2018-09-01 DIAGNOSIS — D692 Other nonthrombocytopenic purpura: Secondary | ICD-10-CM | POA: Diagnosis not present

## 2018-09-01 DIAGNOSIS — L570 Actinic keratosis: Secondary | ICD-10-CM | POA: Diagnosis not present

## 2018-09-22 DIAGNOSIS — E782 Mixed hyperlipidemia: Secondary | ICD-10-CM | POA: Diagnosis not present

## 2018-09-22 DIAGNOSIS — I10 Essential (primary) hypertension: Secondary | ICD-10-CM | POA: Diagnosis not present

## 2018-10-27 ENCOUNTER — Other Ambulatory Visit: Payer: Self-pay | Admitting: Cardiology

## 2018-11-21 DIAGNOSIS — M109 Gout, unspecified: Secondary | ICD-10-CM | POA: Diagnosis not present

## 2018-11-21 DIAGNOSIS — I10 Essential (primary) hypertension: Secondary | ICD-10-CM | POA: Diagnosis not present

## 2018-11-28 ENCOUNTER — Ambulatory Visit (INDEPENDENT_AMBULATORY_CARE_PROVIDER_SITE_OTHER): Payer: Medicare HMO | Admitting: Cardiology

## 2018-11-28 ENCOUNTER — Other Ambulatory Visit: Payer: Self-pay

## 2018-11-28 ENCOUNTER — Encounter: Payer: Self-pay | Admitting: Cardiology

## 2018-11-28 VITALS — BP 152/69 | HR 73 | Ht 70.0 in | Wt 167.4 lb

## 2018-11-28 DIAGNOSIS — E782 Mixed hyperlipidemia: Secondary | ICD-10-CM

## 2018-11-28 DIAGNOSIS — I2510 Atherosclerotic heart disease of native coronary artery without angina pectoris: Secondary | ICD-10-CM

## 2018-11-28 DIAGNOSIS — I10 Essential (primary) hypertension: Secondary | ICD-10-CM

## 2018-11-28 MED ORDER — NITROGLYCERIN 0.4 MG SL SUBL: 0.4000 mg | SUBLINGUAL_TABLET | SUBLINGUAL | 3 refills | Status: DC | PRN

## 2018-11-28 NOTE — Patient Instructions (Signed)
Your physician wants you to follow-up in: 6 MONTHS WITH DR BRANCH You will receive a reminder letter in the mail two months in advance. If you don't receive a letter, please call our office to schedule the follow-up appointment.  Your physician has recommended you make the following change in your medication:   STOP PLAVIX   Thank you for choosing [facility]!!     

## 2018-11-28 NOTE — Progress Notes (Signed)
Clinical Summary Mr. Fielding is a 83 y.o.male seen today for follow up of the following medical problems.   1. CAD - remote history of caths somewhat unclear. From available clinic notes DES to LCX in 2005. Notes mention prior to that a remote history of PTCA to RCA - compliant with meds. Has been on plavix several years, it appears since 2005. This was continued through his follow ups  - no recent chest pain. No SOB/DOE - compliant with meds   2. AAA - s/p endovascular repair - followed by vascular  3. Carotid stenosis with left subclavian stenosis - 2018 mild bilaterally.  - bp's need to be checked in right arm  - no recent symptoms  4. COPD - managed by pcp, recent exacerbation  5. HTN - had some low bp's, pcp lowered lopressor to 12.5mg  bid  6. Hyperlipidemia Jan 2020 TC 140 TG 135 HDL 35 LDL 78 - compliant with statin Past Medical History:  Diagnosis Date  . AAA (abdominal aortic aneurysm) ([place])    06/02/2003 old BPG  . Arthritis   . Carotid artery occlusion   . Carotid bruit   . COPD (chronic obstructive pulmonary disease) ([place])   . Coronary artery disease   . Diverticulosis of colon   . Erectile dysfunction   . Hemorrhoids, internal   . History of compression fracture of spine   . History of gout   . Hyperlipidemia   . Hypertension   . Knee pain   . Myocardial infarction ([place]) 2005  . Peripheral vascular disease (HCC)      Allergies  Allergen Reactions  . Doxycycline Rash  . Penicillins Rash  . Sulfonamide Derivatives Rash     Current Outpatient Medications  Medication Sig Dispense Refill  . allopurinol (ZYLOPRIM) 300 MG tablet Take 1 tablet (300 mg total) by mouth daily. 90 tablet 3  . aspirin 81 MG tablet Take 81 mg by mouth daily.     . Cholecalciferol (VITAMIN D) 1000 UNITS capsule Take 1,000 Units by mouth daily.      . clopidogrel (PLAVIX) 75 MG tablet Take 1 tablet (75 mg total) by mouth daily. 90 tablet 3  . lisinopril  (ZESTRIL) 5 MG tablet TAKE 1 TABLET BY MOUTH DAILY  90 tablet 1  . metoprolol (LOPRESSOR) 50 MG tablet Take 1 tablet by mouth  twice a day 180 tablet 3  . nitroGLYCERIN (NITROSTAT) 0.4 MG SL tablet Place 0.4 mg under the tongue every 5 (five) minutes as needed.      . pantoprazole (PROTONIX) 40 MG tablet Take 1 tablet (40 mg total) by mouth daily. 90 tablet 3  . simvastatin (ZOCOR) 40 MG tablet Take 1 tablet (40 mg total) by mouth every evening. 90 tablet 3   No current facility-administered medications for this visit.      Past Surgical History:  Procedure Laterality Date  . ABDOMINAL AORTIC ENDOVASCULAR STENT GRAFT N/A 04/07/2017   Procedure: ENDOVASCULAR ABDOMINAL ANEURYSM REPAIR;  Surgeon: Serafina Mitchell, MD;  Location: [facility];  Service: Vascular;  Laterality: N/A;  . CORONARY ANGIOPLASTY  2005     Allergies  Allergen Reactions  . Doxycycline Rash  . Penicillins Rash  . Sulfonamide Derivatives Rash      Family History  Problem Relation Age of Onset  . Heart disease Father        Heart Disease before age 37  . Heart attack Father   . Cancer Mother  Colon  . Heart disease Mother   . Hyperlipidemia Mother   . Hypertension Mother   . Heart attack Mother   . Colon cancer Mother   . Cancer Brother        lung cancer     Social History Mr. Cronan reports that he quit smoking about 36 years ago. His smoking use included cigarettes. He has a 30.00 pack-year smoking history. He has never used smokeless tobacco. Mr. Jaskolka reports no history of alcohol use.   Review of Systems CONSTITUTIONAL: No weight loss, fever, chills, weakness or fatigue.  HEENT: Eyes: No visual loss, blurred vision, double vision or yellow sclerae.No hearing loss, sneezing, congestion, runny nose or sore throat.  SKIN: No rash or itching.  CARDIOVASCULAR: per hpi RESPIRATORY: No shortness of breath, cough or sputum.  GASTROINTESTINAL: No anorexia, nausea, vomiting or diarrhea. No abdominal  pain or blood.  GENITOURINARY: No burning on urination, no polyuria NEUROLOGICAL: No headache, dizziness, syncope, paralysis, ataxia, numbness or tingling in the extremities. No change in bowel or bladder control.  MUSCULOSKELETAL: No muscle, back pain, joint pain or stiffness.  LYMPHATICS: No enlarged nodes. No history of splenectomy.  PSYCHIATRIC: No history of depression or anxiety.  ENDOCRINOLOGIC: No reports of sweating, cold or heat intolerance. No polyuria or polydipsia.  [name]   Physical Examination Today's Vitals   11/28/18 1610  BP: (!) 152/69  Pulse: 73  SpO2: 98%  Weight: 167 lb 6.4 oz (75.9 kg)  Height: 5\' 10"  (1.778 m)   Body mass index is 24.02 kg/m.  Gen: resting comfortably, no acute distress HEENT: no scleral icterus, pupils equal round and reactive, no palptable cervical adenopathy,  CV: RRR, no m/r/g ,no jvd Resp: Clear to auscultation bilaterally GI: abdomen is soft, non-tender, non-distended, normal bowel sounds, no hepatosplenomegaly MSK: extremities are warm, no edema.  Skin: warm, no rash Neuro:  no focal deficits Psych: appropriate affect   Diagnostic Studies 08/2003 cath CLINICAL HISTORY: Mr. Raikes is 83 years old and had a remote PTCA of the right coronary artery by Dr. Shelva Majestic and was admitted yesterday with chest pain and minimal ST elevation in the inferior leads. He was studied by Dr. Ethelle Lyon and found to have a chronically totally occluded right coronary artery which was small, and 80% to 90% stenosis near the ostium of the circumflex artery with 70% to 80% ostial stenosis of the first marginal Karna Abed. Dr. Albertine Patricia stented the proximal circumflex artery with a TAXUS stent, feeling this was the culprit vessel. The patient developed some recurrent chest discomfort last night and today, and for this reason was brought back to the laboratory for reevaluation. His EKG showed slight inferior ST elevation.  PROCEDURE:  The procedure was performed via the left femoral artery using arterial sheath and a left diagnostic catheter. Femoral arterial puncture was performed and Omnipaque contrast was used. The left femoral artery was closed with AngioSeal at the end of the procedure. The patient tolerated the procedure well and left the laboratory in satisfactory condition.  RESULTS: The stent in the proximal circumflex artery which crossed the first marginal Kerrion Kemppainen was widely patent with no stenosis. The first marginal Mykeria Garman was narrowed at the ostium to 70% to 80%. This appeared better than on the immediate post-stenting film from yesterday. There was also a 30% to 40% narrowing downstream in the circumflex artery.  CONCLUSION: Widely patent stent in the proximal circumflex artery with 80% narrowing in a side Tallen Schnorr, but with brisk TIMI-3 flow.  RECOMMENDATIONS: The stent is widely patent and the side Goddess Gebbia has brisk TIMI flow and the ostial stenosis appears better than the post-procedure films. I think it is very unlikely that the patient's symptoms are related to the marginal Cedar Roseman narrowing. His symptoms may be related to his Plavix and we will plan to treat him with Pepcid and continued observation.   123456 cath COMPLICATIONS: None.  FINDINGS: 1. LV 114/5/13. EF approximately 60% without regional wall motion abnormality in an RAO projection. 2. No aortic stenosis or mitral regurgitation. 3. Left main: Angiographically normal. 4. LAD: Large vessel wrapping the apex. There is a 50% stenosis of the proximal vessel. 5. Circumflex: Hazy, approximately 80% stenosis of the proximal vessel involving the takeoff of moderate sized first obtuse marginal. This first marginal had an 80% stenosis. The AV groove circumflex was stented to no residual stenosis. There remained a 70% stenosis in the obtuse marginal. TIMI 3 flow was  maintained in both the circumflex proper and the first obtuse marginal. 6. RCA: Moderate sized, dominant vessel. There is a total occlusion of the mid vessel after the takeoff of the acute marginal. There are bridging collaterals and modest left to right collaterals. The bridging collaterals are very suggestive of chronic total occlusion. Films are reviewed with Dr. Olevia Perches who agreed.  IMPRESSION/RECOMMENDATIONS: Successful drug-eluting stent placement in the culprit lesion of the proximal circumflex. The patient tolerated the procedure well. Eptifibatide will be continued for 18 hours. Sheaths will be removed when the ACT is less than 175 seconds.  The patient has agreed to participate in the TRITON study comparing Plavix with a novel thienopyridine. He will be continued on this study drug for approximately one year. He, thus, should not receive any Plavix through that period. Aspirin will be continued indefinitely.     Assessment and Plan  1. CAD - no symptoms, continue current meds. Stop plavix, no indication for continued use   2. HTN - manual recheck 135/70, reaosnable control for him. Continue current meds  3. Hyperlipidemia - has been on zocor long time, lipids are reasonable, continue current meds  F/u 6 months      Arnoldo Lenis, M.D.

## 2018-12-02 ENCOUNTER — Encounter: Payer: Self-pay | Admitting: *Deleted

## 2018-12-15 ENCOUNTER — Other Ambulatory Visit: Payer: Self-pay | Admitting: *Deleted

## 2018-12-22 DIAGNOSIS — Z20828 Contact with and (suspected) exposure to other viral communicable diseases: Secondary | ICD-10-CM | POA: Diagnosis not present

## 2018-12-30 DIAGNOSIS — U071 COVID-19: Secondary | ICD-10-CM | POA: Diagnosis not present

## 2019-01-02 DIAGNOSIS — R197 Diarrhea, unspecified: Secondary | ICD-10-CM | POA: Diagnosis not present

## 2019-01-02 DIAGNOSIS — B9729 Other coronavirus as the cause of diseases classified elsewhere: Secondary | ICD-10-CM | POA: Diagnosis not present

## 2019-01-02 DIAGNOSIS — E785 Hyperlipidemia, unspecified: Secondary | ICD-10-CM | POA: Diagnosis not present

## 2019-01-02 DIAGNOSIS — R5383 Other fatigue: Secondary | ICD-10-CM | POA: Diagnosis not present

## 2019-01-02 DIAGNOSIS — R7401 Elevation of levels of liver transaminase levels: Secondary | ICD-10-CM | POA: Diagnosis not present

## 2019-01-02 DIAGNOSIS — I10 Essential (primary) hypertension: Secondary | ICD-10-CM | POA: Diagnosis not present

## 2019-01-02 DIAGNOSIS — Z66 Do not resuscitate: Secondary | ICD-10-CM | POA: Diagnosis not present

## 2019-01-02 DIAGNOSIS — E873 Alkalosis: Secondary | ICD-10-CM | POA: Diagnosis not present

## 2019-01-02 DIAGNOSIS — E872 Acidosis: Secondary | ICD-10-CM | POA: Diagnosis not present

## 2019-01-02 DIAGNOSIS — J440 Chronic obstructive pulmonary disease with acute lower respiratory infection: Secondary | ICD-10-CM | POA: Diagnosis not present

## 2019-01-02 DIAGNOSIS — J189 Pneumonia, unspecified organism: Secondary | ICD-10-CM | POA: Diagnosis not present

## 2019-01-02 DIAGNOSIS — R0602 Shortness of breath: Secondary | ICD-10-CM | POA: Diagnosis not present

## 2019-01-02 DIAGNOSIS — R918 Other nonspecific abnormal finding of lung field: Secondary | ICD-10-CM | POA: Diagnosis not present

## 2019-01-02 DIAGNOSIS — U071 COVID-19: Secondary | ICD-10-CM | POA: Diagnosis not present

## 2019-01-02 DIAGNOSIS — J1289 Other viral pneumonia: Secondary | ICD-10-CM | POA: Diagnosis not present

## 2019-01-02 DIAGNOSIS — A4189 Other specified sepsis: Secondary | ICD-10-CM | POA: Diagnosis not present

## 2019-01-02 DIAGNOSIS — R6520 Severe sepsis without septic shock: Secondary | ICD-10-CM | POA: Diagnosis not present

## 2019-01-02 DIAGNOSIS — J9601 Acute respiratory failure with hypoxia: Secondary | ICD-10-CM | POA: Diagnosis not present

## 2019-01-02 DIAGNOSIS — I2510 Atherosclerotic heart disease of native coronary artery without angina pectoris: Secondary | ICD-10-CM | POA: Diagnosis not present

## 2019-01-02 DIAGNOSIS — R9431 Abnormal electrocardiogram [ECG] [EKG]: Secondary | ICD-10-CM | POA: Diagnosis not present

## 2019-01-02 DIAGNOSIS — A419 Sepsis, unspecified organism: Secondary | ICD-10-CM | POA: Diagnosis not present

## 2019-01-21 DIAGNOSIS — J069 Acute upper respiratory infection, unspecified: Secondary | ICD-10-CM | POA: Diagnosis not present

## 2019-01-21 DIAGNOSIS — Z6825 Body mass index (BMI) 25.0-25.9, adult: Secondary | ICD-10-CM | POA: Diagnosis not present

## 2019-01-21 DIAGNOSIS — U071 COVID-19: Secondary | ICD-10-CM | POA: Diagnosis not present

## 2019-02-20 DIAGNOSIS — I10 Essential (primary) hypertension: Secondary | ICD-10-CM | POA: Diagnosis not present

## 2019-02-20 DIAGNOSIS — M109 Gout, unspecified: Secondary | ICD-10-CM | POA: Diagnosis not present

## 2019-03-02 DIAGNOSIS — Z6825 Body mass index (BMI) 25.0-25.9, adult: Secondary | ICD-10-CM | POA: Diagnosis not present

## 2019-03-02 DIAGNOSIS — J441 Chronic obstructive pulmonary disease with (acute) exacerbation: Secondary | ICD-10-CM | POA: Diagnosis not present

## 2019-03-02 DIAGNOSIS — R062 Wheezing: Secondary | ICD-10-CM | POA: Diagnosis not present

## 2019-03-09 DIAGNOSIS — J189 Pneumonia, unspecified organism: Secondary | ICD-10-CM | POA: Diagnosis not present

## 2019-03-20 DIAGNOSIS — E7849 Other hyperlipidemia: Secondary | ICD-10-CM | POA: Diagnosis not present

## 2019-03-20 DIAGNOSIS — I10 Essential (primary) hypertension: Secondary | ICD-10-CM | POA: Diagnosis not present

## 2019-05-12 DIAGNOSIS — K219 Gastro-esophageal reflux disease without esophagitis: Secondary | ICD-10-CM | POA: Diagnosis not present

## 2019-05-12 DIAGNOSIS — J449 Chronic obstructive pulmonary disease, unspecified: Secondary | ICD-10-CM | POA: Diagnosis not present

## 2019-05-12 DIAGNOSIS — I10 Essential (primary) hypertension: Secondary | ICD-10-CM | POA: Diagnosis not present

## 2019-05-12 DIAGNOSIS — M109 Gout, unspecified: Secondary | ICD-10-CM | POA: Diagnosis not present

## 2019-05-12 DIAGNOSIS — I739 Peripheral vascular disease, unspecified: Secondary | ICD-10-CM | POA: Diagnosis not present

## 2019-05-12 DIAGNOSIS — Z6825 Body mass index (BMI) 25.0-25.9, adult: Secondary | ICD-10-CM | POA: Diagnosis not present

## 2019-05-12 DIAGNOSIS — Z7982 Long term (current) use of aspirin: Secondary | ICD-10-CM | POA: Diagnosis not present

## 2019-05-12 DIAGNOSIS — E785 Hyperlipidemia, unspecified: Secondary | ICD-10-CM | POA: Diagnosis not present

## 2019-05-12 DIAGNOSIS — I25118 Atherosclerotic heart disease of native coronary artery with other forms of angina pectoris: Secondary | ICD-10-CM | POA: Diagnosis not present

## 2019-06-01 ENCOUNTER — Other Ambulatory Visit: Payer: Self-pay | Admitting: *Deleted

## 2019-06-01 DIAGNOSIS — I714 Abdominal aortic aneurysm, without rupture, unspecified: Secondary | ICD-10-CM

## 2019-06-02 ENCOUNTER — Telehealth (HOSPITAL_COMMUNITY): Payer: Self-pay

## 2019-06-02 NOTE — Telephone Encounter (Signed)

## 2019-06-03 ENCOUNTER — Ambulatory Visit (HOSPITAL_COMMUNITY)
Admission: RE | Admit: 2019-06-03 | Discharge: 2019-06-03 | Disposition: A | Discharge status: Home / Self Care | Payer: Medicare HMO | Source: Ambulatory Visit | Attending: Vascular Surgery | Admitting: Vascular Surgery

## 2019-06-03 ENCOUNTER — Ambulatory Visit (INDEPENDENT_AMBULATORY_CARE_PROVIDER_SITE_OTHER): Payer: Medicare HMO | Admitting: Physician Assistant

## 2019-06-03 ENCOUNTER — Other Ambulatory Visit: Payer: Self-pay

## 2019-06-03 VITALS — BP 157/78 | HR 62 | Temp 97.6°F | Resp 20 | Ht 70.0 in | Wt 165.0 lb

## 2019-06-03 DIAGNOSIS — I714 Abdominal aortic aneurysm, without rupture, unspecified: Secondary | ICD-10-CM

## 2019-06-03 NOTE — Progress Notes (Signed)
[facility] [place] HISTORY AND PHYSICAL   History of Present Illness:  Patient is a 84 y.o. year old male who presents for evaluation of abdominal aortic aneurysm s/p EVAR repair 04/07/2017.   He denise abdominal pain and lumbar pain.  He walks daily and denise symptoms of claudication, rest pain or non healing wounds.   Justin Almond Walkeris a 84 y.o.malewho has been followed at our office for many years for an abdominal aortic aneurysm. He presented to the emergency department with pain. CT scan showed a 5.4 cm aneurysm with radiographic comments that the aneurysm is at risk for rupture. A month earlier ultrasound our office showed a 4.1 cm aneurysm.  Past Medical History:  Diagnosis Date  . AAA (abdominal aortic aneurysm) ([place])    06/02/2003 old BPG  . Arthritis   . Carotid artery occlusion   . Carotid bruit   . COPD (chronic obstructive pulmonary disease) ([place])   . Coronary artery disease   . Diverticulosis of colon   . Erectile dysfunction   . Hemorrhoids, internal   . History of compression fracture of spine   . History of gout   . Hyperlipidemia   . Hypertension   . Knee pain   . Myocardial infarction ([place]) 2005  . Peripheral vascular disease [facility])     Past Surgical History:  Procedure Laterality Date  . ABDOMINAL AORTIC ENDOVASCULAR STENT GRAFT N/A 04/07/2017   Procedure: ENDOVASCULAR ABDOMINAL ANEURYSM REPAIR;  Surgeon: Serafina Mitchell, MD;  Location: [facility];  Service: Vascular;  Laterality: N/A;  . CORONARY ANGIOPLASTY  2005     Social History Social History   Tobacco Use  . Smoking status: Former Smoker    Packs/day: 1.50    Years: 20.00    Pack years: 30.00    Types: Cigarettes    Quit date: 01/22/1982    Years since quitting: 37.3  . Smokeless tobacco: Never Used  Substance Use Topics  . Alcohol use: No  . Drug use: No    Family History Family History  Problem Relation Age of Onset  . Heart disease Father        Heart Disease  before age 71  . Heart attack Father   . Cancer Mother        Colon  . Heart disease Mother   . Hyperlipidemia Mother   . Hypertension Mother   . Heart attack Mother   . Colon cancer Mother   . Cancer Brother        lung cancer    Allergies  Allergies  Allergen Reactions  . Doxycycline Rash  . Penicillins Rash  . Sulfonamide Derivatives Rash     Current Outpatient Medications  Medication Sig Dispense Refill  . albuterol (PROVENTIL) (2.5 MG/3ML) 0.083% nebulizer solution     . albuterol (VENTOLIN HFA) 108 (90 Base) MCG/ACT inhaler     . allopurinol (ZYLOPRIM) 100 MG tablet Take 100 mg by mouth daily.    [name] aspirin 81 MG tablet Take 81 mg by mouth daily.     . Cholecalciferol (VITAMIN D) 1000 UNITS capsule Take 1,000 Units by mouth daily.      . metoprolol tartrate (LOPRESSOR) 25 MG tablet Take 25 mg by mouth 2 (two) times daily.     . nitroGLYCERIN (NITROSTAT) 0.4 MG SL tablet Place 1 tablet (0.4 mg total) under the tongue every 5 (five) minutes as needed. 25 tablet 3  . pantoprazole (PROTONIX) 40 MG tablet Take 1 tablet (40  mg total) by mouth daily. 90 tablet 3  . simvastatin (ZOCOR) 40 MG tablet Take 1 tablet (40 mg total) by mouth every evening. 90 tablet 3   No current facility-administered medications for this visit.    ROS:   General:  No weight loss, Fever, chills  HEENT: No recent headaches, no nasal bleeding, no visual changes, no sore throat  Neurologic: No dizziness, blackouts, seizures. No recent symptoms of stroke or mini- stroke. No recent episodes of slurred speech, or temporary blindness.  Cardiac: No recent episodes of chest pain/pressure, no shortness of breath at rest.  No shortness of breath with exertion.  Denies history of atrial fibrillation or irregular heartbeat  Vascular: No history of rest pain in feet.  No history of claudication.  No history of non-healing ulcer, No history of DVT   Pulmonary: No home oxygen, no productive cough, no  hemoptysis,  No asthma or wheezing  Musculoskeletal:  [ ]  Arthritis, [ ]  Low back pain,  [ ]  Joint pain  Hematologic:No history of hypercoagulable state.  No history of easy bleeding.  No history of anemia  Gastrointestinal: No hematochezia or melena,  No gastroesophageal reflux, no trouble swallowing  Urinary: [ ]  chronic Kidney disease, [ ]  on HD - [ ]  MWF or [ ]  TTHS, [ ]  Burning with urination, [ ]  Frequent urination, [ ]  Difficulty urinating;   Skin: No rashes  Psychological: No history of anxiety,  No history of depression   Physical Examination  Vitals:   06/03/19 0912  BP: (!) 157/78  Pulse: 62  Resp: 20  Temp: 97.6 F (36.4 C)  SpO2: 96%  Weight: 165 lb (74.8 kg)  Height: 5\' 10"  (1.778 m)    Body mass index is 23.68 kg/m.  General:  Alert and oriented, no acute distress HEENT: Normal Neck: No bruit or JVD Pulmonary: Clear to auscultation bilaterally Cardiac: Regular Rate and Rhythm without murmur Gastrointestinal: Soft, non-tender, non-distended, no mass, no scars Skin: No rash Extremity Pulses:  2+ radial, brachial, femoral, dorsalis pedis, posterior tibial pulses bilaterally Musculoskeletal: No deformity or edema  Neurologic: Upper and lower extremity motor 5/5 and symmetric  DATA:    Endovascular Aortic Repair (EVAR):  +----------+----------------+-------------------+-------------------+       Diameter AP (cm)Diameter Trans (cm)Velocities (cm/sec)  +----------+----------------+-------------------+-------------------+  Aorta   5.10      4.87        39           +----------+----------------+-------------------+-------------------+  Right Limb1.47      1.47        37           +----------+----------------+-------------------+-------------------+  Left Limb 1.42      1.52        40           +----------+----------------+-------------------+-------------------+      Summary:  Abdominal Aorta: The largest aortic measurement is 5.1 cm. Patent  endovascular aneurysm repair with no evidence of endoleak. The largest  aortic diameter remains essentially unchanged compared to prior exam.  Previous diameter measurement was 5.2 cm  obtained on 11/25/2017.   ASSESSMENT:  AAA s/p EVAR 2019  The duplex shows decrease in size in the aneurysmal sack and no evidence of endoleak.  Pre-op he had  right flank pain when he presented to the ED.   PLAN: Asymptomatic s/p EVAR He will continue daily activity as tolerates without restrictions.  F/U for repeat AAA duplex in 1 year.  He is on ASA and Staitn daily  Roxy Horseman PA-C [facility] [place] Office: [phone]  MD in clinic [place]

## 2019-06-08 ENCOUNTER — Other Ambulatory Visit: Payer: Self-pay | Admitting: *Deleted

## 2019-06-08 DIAGNOSIS — H905 Unspecified sensorineural hearing loss: Secondary | ICD-10-CM | POA: Diagnosis not present

## 2019-06-08 DIAGNOSIS — I714 Abdominal aortic aneurysm, without rupture, unspecified: Secondary | ICD-10-CM

## 2019-06-09 DIAGNOSIS — Z6825 Body mass index (BMI) 25.0-25.9, adult: Secondary | ICD-10-CM | POA: Diagnosis not present

## 2019-06-09 DIAGNOSIS — R062 Wheezing: Secondary | ICD-10-CM | POA: Diagnosis not present

## 2019-06-09 DIAGNOSIS — J441 Chronic obstructive pulmonary disease with (acute) exacerbation: Secondary | ICD-10-CM | POA: Diagnosis not present

## 2019-06-14 NOTE — Progress Notes (Signed)
Cardiology Office Note  Date: 06/15/2019   ID: Justin [facility], DOB Nov 13, 1935, MRN [id number]  PCP:  Justin Hilding, MD  Cardiologist:  Justin Dolly, MD Electrophysiologist:  None   Chief Complaint: F/U CAD, AAA, HTN, Carotid artery stenosis, COPD/  History of Present Illness: Justin [facility] is a 84 y.o. male with a history of  CAD, AAA, HTN, Carotid artery stenosis (subclavian stenosis BP in R arm only), COPD, HLD  CAD: DES to LCx 2005 Remote hx of PTCA to RCA.  AAA: s/p endovascular repair Carotid artery stenosis. BP R arm only (subclavian stenosis) COPD. Mgmt. By PCP Low BP's Lopressor down titrated to 12.5 mg  Bid  Last encounter with Justin Lyons 11/28/2018 no CAD symptoms. Plavix was stopped. No indications for use. BP under reasonable control. Meds. continued. Lipids reasonable. Continue statin.  States he has been doing well from a cardiac standpoint.  He denies any progressive anginal or exertional symptoms.  He has had a recent bout of bronchitis treated with antibiotics and steroids by PCP.  Denies any palpitations or arrhythmias, orthostatic symptoms, stroke or TIA-like symptoms, bleeding in stool or urine.  Claudication-like symptoms, DVT or PE-like symptoms, or lower extremity edema.  States his blood pressure usually runs in the 110sover 60s at home.  Past Medical History:  Diagnosis Date  . AAA (abdominal aortic aneurysm) ([place])    06/02/2003 old BPG  . Arthritis   . Carotid artery occlusion   . Carotid bruit   . COPD (chronic obstructive pulmonary disease) ([place])   . Coronary artery disease   . Diverticulosis of colon   . Erectile dysfunction   . Hemorrhoids, internal   . History of compression fracture of spine   . History of gout   . Hyperlipidemia   . Hypertension   . Knee pain   . Myocardial infarction ([place]) 2005  . Peripheral vascular disease [facility])     Past Surgical History:  Procedure Laterality Date  . ABDOMINAL AORTIC ENDOVASCULAR STENT GRAFT N/A  04/07/2017   Procedure: ENDOVASCULAR ABDOMINAL ANEURYSM REPAIR;  Surgeon: Justin Mitchell, MD;  Location: [facility];  Service: Vascular;  Laterality: N/A;  . CORONARY ANGIOPLASTY  2005    Current Outpatient Medications  Medication Sig Dispense Refill  . albuterol (PROVENTIL) (2.5 MG/3ML) 0.083% nebulizer solution     . albuterol (VENTOLIN HFA) 108 (90 Base) MCG/ACT inhaler     . allopurinol (ZYLOPRIM) 100 MG tablet Take 100 mg by mouth daily.    [name] aspirin 81 MG tablet Take 81 mg by mouth daily.     . Cholecalciferol (VITAMIN D) 1000 UNITS capsule Take 1,000 Units by mouth daily.      . metoprolol tartrate (LOPRESSOR) 25 MG tablet Take 25 mg by mouth 2 (two) times daily.     . nitroGLYCERIN (NITROSTAT) 0.4 MG SL tablet Place 1 tablet (0.4 mg total) under the tongue every 5 (five) minutes as needed. 25 tablet 3  . pantoprazole (PROTONIX) 40 MG tablet Take 1 tablet (40 mg total) by mouth daily. 90 tablet 3  . simvastatin (ZOCOR) 40 MG tablet Take 1 tablet (40 mg total) by mouth every evening. 90 tablet 3   No current facility-administered medications for this visit.   Allergies:  Doxycycline, Penicillins, and Sulfonamide derivatives   Social History: The patient  reports that he quit smoking about 37 years ago. His smoking use included cigarettes. He has a 30.00 pack-year smoking history. He has never used smokeless tobacco.  He reports that he does not drink alcohol or use drugs.   Family History: The patient's family history includes Cancer in his brother and mother; Colon cancer in his mother; Heart attack in his father and mother; Heart disease in his father and mother; Hyperlipidemia in his mother; Hypertension in his mother.   ROS:  Please see the history of present illness. Otherwise, complete review of systems is positive for none.  All other systems are reviewed and negative.   Physical Exam: VS:  BP 140/64   Pulse 70   Ht 5\' 10"  (1.778 m)   Wt 167 lb 9.6 oz (76 kg)   SpO2 97%   BMI  24.05 kg/m , BMI Body mass index is 24.05 kg/m.  Wt Readings from Last 3 Encounters:  06/15/19 167 lb 9.6 oz (76 kg)  06/03/19 165 lb (74.8 kg)  11/28/18 167 lb 6.4 oz (75.9 kg)    General: Patient appears comfortable at rest. Neck: Supple, no elevated JVP or carotid bruits, no thyromegaly. Lungs: Minor expiratory wheezing heard throughout posterior lung fields, nonlabored breathing at rest. Cardiac: Regular rate and rhythm, no S3 or significant systolic murmur, no pericardial rub. Extremities: No pitting edema, distal pulses 2+. Skin: Warm and dry. Musculoskeletal: No kyphosis. Neuropsychiatric: Alert and oriented x3, affect grossly appropriate.  ECG:    Recent Labwork: No results found for requested labs within last 8760 hours.     Component Value Date/Time   CHOL 136 09/19/2012 0958   TRIG 121.0 09/19/2012 0958   HDL 38.90 (L) 09/19/2012 0958   CHOLHDL 3 09/19/2012 0958   VLDL 24.2 09/19/2012 0958   LDLCALC 73 09/19/2012 0958    Other Studies Reviewed Today: Diagnostic Studies 08/2003 cath CLINICAL HISTORY: Justin Lyons is 84 years old and had a remote PTCA of the right coronary artery by Justin. Shelva Lyons and was admitted yesterday with chest pain and minimal ST elevation in the inferior leads. He was studied by Justin Lyons and found to have a chronically totally occluded right coronary artery which was small, and 80% to 90% stenosis near the ostium of the circumflex artery with 70% to 80% ostial stenosis of the first marginal branch. Justin Lyons stented the proximal circumflex artery with a TAXUS stent, feeling this was the culprit vessel. The patient developed some recurrent chest discomfort last night and today, and for this reason was brought back to the laboratory for reevaluation. His EKG showed slight inferior ST elevation.  PROCEDURE: The procedure was performed via the left femoral artery using arterial sheath and a left diagnostic  catheter. Femoral arterial puncture was performed and Omnipaque contrast was used. The left femoral artery was closed with AngioSeal at the end of the procedure. The patient tolerated the procedure well and left the laboratory in satisfactory condition.  RESULTS: The stent in the proximal circumflex artery which crossed the first marginal branch was widely patent with no stenosis. The first marginal branch was narrowed at the ostium to 70% to 80%. This appeared better than on the immediate post-stenting film from yesterday. There was also a 30% to 40% narrowing downstream in the circumflex artery.  CONCLUSION: Widely patent stent in the proximal circumflex artery with 80% narrowing in a side branch, but with brisk TIMI-3 flow.  RECOMMENDATIONS: The stent is widely patent and the side branch has brisk TIMI flow and the ostial stenosis appears better than the post-procedure films. I think it is very unlikely that the patient's symptoms are related to  the marginal branch narrowing. His symptoms may be related to his Plavix and we will plan to treat him with Pepcid and continued observation.   123456 cath COMPLICATIONS: None.  FINDINGS: 1. LV 114/5/13. EF approximately 60% without regional wall motion abnormality in an RAO projection. 2. No aortic stenosis or mitral regurgitation. 3. Left main: Angiographically normal. 4. LAD: Large vessel wrapping the apex. There is a 50% stenosis of the proximal vessel. 5. Circumflex: Hazy, approximately 80% stenosis of the proximal vessel involving the takeoff of moderate sized first obtuse marginal. This first marginal had an 80% stenosis. The AV groove circumflex was stented to no residual stenosis. There remained a 70% stenosis in the obtuse marginal. TIMI 3 flow was maintained in both the circumflex proper and the first obtuse marginal. 6. RCA: Moderate sized,  dominant vessel. There is a total occlusion of the mid vessel after the takeoff of the acute marginal. There are bridging collaterals and modest left to right collaterals. The bridging collaterals are very suggestive of chronic total occlusion. Films are reviewed with Justin. Olevia Perches who agreed.  IMPRESSION/RECOMMENDATIONS: Successful drug-eluting stent placement in the culprit lesion of the proximal circumflex. The patient tolerated the procedure well. Eptifibatide will be continued for 18 hours. Sheaths will be removed when the ACT is less than 175 seconds.  The patient has agreed to participate in the TRITON study comparing Plavix with a novel thienopyridine. He will be continued on this study drug for approximately one year. He, thus, should not receive any Plavix through that period. Aspirin will be continued indefinitely.     Assessment and Plan:  1. CAD in native artery   2. Essential hypertension   3. Mixed hyperlipidemia   4. Left carotid bruit    1. CAD in native artery He denies any progressive anginal or exertional symptoms.  Continue aspirin 81 mg, nitroglycerin sublingual 0.4 mg sublingual as needed.  2. Essential hypertension Blood pressure elevated today at 140/64.  However patient states at home blood pressure runs in the 110s over 60s.  Continue metoprolol 25 mg p.o. twice daily.  3. Mixed hyperlipidemia Last recorded LDL was 73.  Continue simvastatin 40 mg daily.   4.  Left carotid bruit Audible left carotid bruit heard on auscultation today.  Repeat carotid artery duplex study.  Last study was December 2018.  Medication Adjustments/Labs and Tests Ordered: Current medicines are reviewed at length with the patient today.  Concerns regarding medicines are outlined above.   Disposition: Follow-up with Justin Lyons or APP 6 months.  Signed, Levell July, NP 06/15/2019 8:24 AM    [facility] at  [place], [place], [place] [address] Phone: [phone]; Fax: [phone]

## 2019-06-15 ENCOUNTER — Ambulatory Visit: Payer: Medicare HMO | Admitting: Family Medicine

## 2019-06-15 ENCOUNTER — Encounter: Payer: Self-pay | Admitting: Family Medicine

## 2019-06-15 ENCOUNTER — Telehealth: Payer: Self-pay | Admitting: Cardiology

## 2019-06-15 ENCOUNTER — Other Ambulatory Visit: Payer: Self-pay

## 2019-06-15 ENCOUNTER — Encounter: Payer: Self-pay | Admitting: *Deleted

## 2019-06-15 VITALS — BP 140/64 | HR 70 | Ht 70.0 in | Wt 167.6 lb

## 2019-06-15 DIAGNOSIS — I2510 Atherosclerotic heart disease of native coronary artery without angina pectoris: Secondary | ICD-10-CM

## 2019-06-15 DIAGNOSIS — E782 Mixed hyperlipidemia: Secondary | ICD-10-CM

## 2019-06-15 DIAGNOSIS — R0989 Other specified symptoms and signs involving the circulatory and respiratory systems: Secondary | ICD-10-CM | POA: Diagnosis not present

## 2019-06-15 DIAGNOSIS — I10 Essential (primary) hypertension: Secondary | ICD-10-CM | POA: Diagnosis not present

## 2019-06-15 NOTE — Patient Instructions (Addendum)
Medication Instructions:    Your physician recommends that you continue on your current medications as directed. Please refer to the Current Medication list given to you today.  Labwork:  NONE  Testing/Procedures: Your physician has requested that you have a carotid duplex in October 2021. This test is an ultrasound of the carotid arteries in your neck. It looks at blood flow through these arteries that supply the brain with blood. Allow one hour for this exam. There are no restrictions or special instructions.  Follow-Up:  Your physician recommends that you schedule a follow-up appointment in: 6 months (office).   Any Other Special Instructions Will Be Listed Below (If Applicable).  If you need a refill on your cardiac medications before your next appointment, please call your pharmacy.

## 2019-06-15 NOTE — Telephone Encounter (Signed)
°  Precert needed for: Carotid Doppler in October 2021 1 mth b4 visit dx: left carotid bruit   Location: [facility]    Date: Dec 16, 2019

## 2019-06-16 DIAGNOSIS — H905 Unspecified sensorineural hearing loss: Secondary | ICD-10-CM | POA: Diagnosis not present

## 2019-06-22 DIAGNOSIS — E7849 Other hyperlipidemia: Secondary | ICD-10-CM | POA: Diagnosis not present

## 2019-06-22 DIAGNOSIS — I2510 Atherosclerotic heart disease of native coronary artery without angina pectoris: Secondary | ICD-10-CM | POA: Diagnosis not present

## 2019-06-22 DIAGNOSIS — Z87891 Personal history of nicotine dependence: Secondary | ICD-10-CM | POA: Diagnosis not present

## 2019-06-22 DIAGNOSIS — I10 Essential (primary) hypertension: Secondary | ICD-10-CM | POA: Diagnosis not present

## 2019-07-22 DIAGNOSIS — Z87891 Personal history of nicotine dependence: Secondary | ICD-10-CM | POA: Diagnosis not present

## 2019-07-22 DIAGNOSIS — E7849 Other hyperlipidemia: Secondary | ICD-10-CM | POA: Diagnosis not present

## 2019-07-22 DIAGNOSIS — I2510 Atherosclerotic heart disease of native coronary artery without angina pectoris: Secondary | ICD-10-CM | POA: Diagnosis not present

## 2019-07-22 DIAGNOSIS — I10 Essential (primary) hypertension: Secondary | ICD-10-CM | POA: Diagnosis not present

## 2019-09-02 DIAGNOSIS — Z85828 Personal history of other malignant neoplasm of skin: Secondary | ICD-10-CM | POA: Diagnosis not present

## 2019-09-02 DIAGNOSIS — L821 Other seborrheic keratosis: Secondary | ICD-10-CM | POA: Diagnosis not present

## 2019-09-02 DIAGNOSIS — C44319 Basal cell carcinoma of skin of other parts of face: Secondary | ICD-10-CM | POA: Diagnosis not present

## 2019-09-02 DIAGNOSIS — L814 Other melanin hyperpigmentation: Secondary | ICD-10-CM | POA: Diagnosis not present

## 2019-09-02 DIAGNOSIS — L570 Actinic keratosis: Secondary | ICD-10-CM | POA: Diagnosis not present

## 2019-09-22 DIAGNOSIS — E7849 Other hyperlipidemia: Secondary | ICD-10-CM | POA: Diagnosis not present

## 2019-09-22 DIAGNOSIS — Z87891 Personal history of nicotine dependence: Secondary | ICD-10-CM | POA: Diagnosis not present

## 2019-09-22 DIAGNOSIS — I10 Essential (primary) hypertension: Secondary | ICD-10-CM | POA: Diagnosis not present

## 2019-09-22 DIAGNOSIS — I2510 Atherosclerotic heart disease of native coronary artery without angina pectoris: Secondary | ICD-10-CM | POA: Diagnosis not present

## 2019-10-22 DIAGNOSIS — E7849 Other hyperlipidemia: Secondary | ICD-10-CM | POA: Diagnosis not present

## 2019-10-22 DIAGNOSIS — I10 Essential (primary) hypertension: Secondary | ICD-10-CM | POA: Diagnosis not present

## 2019-10-22 DIAGNOSIS — I2510 Atherosclerotic heart disease of native coronary artery without angina pectoris: Secondary | ICD-10-CM | POA: Diagnosis not present

## 2019-11-04 DIAGNOSIS — R062 Wheezing: Secondary | ICD-10-CM | POA: Diagnosis not present

## 2019-11-04 DIAGNOSIS — Z20828 Contact with and (suspected) exposure to other viral communicable diseases: Secondary | ICD-10-CM | POA: Diagnosis not present

## 2019-11-04 DIAGNOSIS — J441 Chronic obstructive pulmonary disease with (acute) exacerbation: Secondary | ICD-10-CM | POA: Diagnosis not present

## 2019-11-21 DIAGNOSIS — I10 Essential (primary) hypertension: Secondary | ICD-10-CM | POA: Diagnosis not present

## 2019-11-21 DIAGNOSIS — E7849 Other hyperlipidemia: Secondary | ICD-10-CM | POA: Diagnosis not present

## 2019-11-21 DIAGNOSIS — Z87891 Personal history of nicotine dependence: Secondary | ICD-10-CM | POA: Diagnosis not present

## 2019-11-21 DIAGNOSIS — I2510 Atherosclerotic heart disease of native coronary artery without angina pectoris: Secondary | ICD-10-CM | POA: Diagnosis not present

## 2019-12-08 ENCOUNTER — Ambulatory Visit (INDEPENDENT_AMBULATORY_CARE_PROVIDER_SITE_OTHER): Payer: Medicare HMO

## 2019-12-08 DIAGNOSIS — R0989 Other specified symptoms and signs involving the circulatory and respiratory systems: Secondary | ICD-10-CM

## 2019-12-10 ENCOUNTER — Telehealth: Payer: Self-pay | Admitting: *Deleted

## 2019-12-10 NOTE — Telephone Encounter (Signed)
-----   Message from Verta Ellen., NP sent at 12/09/2019 10:12 PM EST ----- Please call the patient let him know the carotid artery study showed he has some mild narrowing in both carotid arteries.  This is unchanged since the previous study in 2018.

## 2019-12-10 NOTE — Telephone Encounter (Signed)
Laurine Blazer, LPN  22/24/1146 4:31 PM EST Back to Top    Notified, copy to pcp.

## 2019-12-22 ENCOUNTER — Ambulatory Visit: Payer: Medicare HMO | Admitting: Cardiology

## 2019-12-23 DIAGNOSIS — J441 Chronic obstructive pulmonary disease with (acute) exacerbation: Secondary | ICD-10-CM | POA: Diagnosis not present

## 2019-12-23 DIAGNOSIS — R062 Wheezing: Secondary | ICD-10-CM | POA: Diagnosis not present

## 2019-12-23 DIAGNOSIS — Z20828 Contact with and (suspected) exposure to other viral communicable diseases: Secondary | ICD-10-CM | POA: Diagnosis not present

## 2020-01-22 DIAGNOSIS — I10 Essential (primary) hypertension: Secondary | ICD-10-CM | POA: Diagnosis not present

## 2020-01-22 DIAGNOSIS — I2510 Atherosclerotic heart disease of native coronary artery without angina pectoris: Secondary | ICD-10-CM | POA: Diagnosis not present

## 2020-01-22 DIAGNOSIS — E7849 Other hyperlipidemia: Secondary | ICD-10-CM | POA: Diagnosis not present

## 2020-02-17 ENCOUNTER — Ambulatory Visit: Payer: Medicare HMO | Admitting: Cardiology

## 2020-03-18 ENCOUNTER — Ambulatory Visit: Payer: Medicare HMO | Admitting: Cardiology

## 2020-03-18 ENCOUNTER — Encounter: Payer: Self-pay | Admitting: Cardiology

## 2020-03-18 ENCOUNTER — Other Ambulatory Visit: Payer: Self-pay

## 2020-03-18 ENCOUNTER — Encounter: Payer: Self-pay | Admitting: *Deleted

## 2020-03-18 VITALS — BP 140/62 | HR 65 | Ht 70.0 in | Wt 171.0 lb

## 2020-03-18 DIAGNOSIS — I10 Essential (primary) hypertension: Secondary | ICD-10-CM | POA: Diagnosis not present

## 2020-03-18 DIAGNOSIS — I2510 Atherosclerotic heart disease of native coronary artery without angina pectoris: Secondary | ICD-10-CM

## 2020-03-18 DIAGNOSIS — E782 Mixed hyperlipidemia: Secondary | ICD-10-CM | POA: Diagnosis not present

## 2020-03-18 NOTE — Progress Notes (Signed)
Clinical Summary Mr. Cardenas is a 85 y.o.male seen today for follow up of the following medical problems.   1. CAD - remote history of caths somewhat unclear. From available clinic notes DES to LCX in 2005.Notes mention prior to that a remote history of PTCA to RCA - compliant with meds. Has been on plavix several years, it appears since 2005. This was continued through his follow ups    - no chest pain. No SOB or DOE -he remains compliant with meds   2. AAA - s/p endovascular repair - followed by vascular, last appt 05/2019  3. Carotid stenosis with left subclavian stenosis - 11/2019 [place]  mild bilaterally. - bp's need to be checked in right arm  - no neuro symptoms  4. COPD - managed by pcp  5. HTN - had some low bp's, pcp lowered lopressor to 12.5mg  bid - home bp's 130s/60s - due to left subclavian stenosis, bp's must be measured in right arm   6. Hyperlipidemia Jan 2020 TC 140 TG 135 HDL 35 LDL 78 - labs followed by pcp - he is compliant with meds   Past Medical History:  Diagnosis Date  . AAA (abdominal aortic aneurysm) ([place])    06/02/2003 old BPG  . Arthritis   . Carotid artery occlusion   . Carotid bruit   . COPD (chronic obstructive pulmonary disease) ([place])   . Coronary artery disease   . Diverticulosis of colon   . Erectile dysfunction   . Hemorrhoids, internal   . History of compression fracture of spine   . History of gout   . Hyperlipidemia   . Hypertension   . Knee pain   . Myocardial infarction ([place]) 2005  . Peripheral vascular disease ([facility])      Allergies  Allergen Reactions  . Doxycycline Rash  . Penicillins Rash  . Sulfonamide Derivatives Rash     Current Outpatient Medications  Medication Sig Dispense Refill  . albuterol (PROVENTIL) (2.5 MG/3ML) 0.083% nebulizer solution     . albuterol (VENTOLIN HFA) 108 (90 Base) MCG/ACT inhaler     . allopurinol (ZYLOPRIM) 100 MG tablet Take 100 mg by mouth daily.    [name] aspirin  81 MG tablet Take 81 mg by mouth daily.     . Cholecalciferol (VITAMIN D) 1000 UNITS capsule Take 1,000 Units by mouth daily.      . metoprolol tartrate (LOPRESSOR) 25 MG tablet Take 25 mg by mouth 2 (two) times daily.     . nitroGLYCERIN (NITROSTAT) 0.4 MG SL tablet Place 1 tablet (0.4 mg total) under the tongue every 5 (five) minutes as needed. 25 tablet 3  . pantoprazole (PROTONIX) 40 MG tablet Take 1 tablet (40 mg total) by mouth daily. 90 tablet 3  . simvastatin (ZOCOR) 40 MG tablet Take 1 tablet (40 mg total) by mouth every evening. 90 tablet 3   No current facility-administered medications for this visit.     Past Surgical History:  Procedure Laterality Date  . ABDOMINAL AORTIC ENDOVASCULAR STENT GRAFT N/A 04/07/2017   Procedure: ENDOVASCULAR ABDOMINAL ANEURYSM REPAIR;  Surgeon: Serafina Mitchell, MD;  Location: [facility];  Service: Vascular;  Laterality: N/A;  . CORONARY ANGIOPLASTY  2005     Allergies  Allergen Reactions  . Doxycycline Rash  . Penicillins Rash  . Sulfonamide Derivatives Rash      Family History  Problem Relation Age of Onset  . Heart disease Father        Heart  Disease before age 60  . Heart attack Father   . Cancer Mother        Colon  . Heart disease Mother   . Hyperlipidemia Mother   . Hypertension Mother   . Heart attack Mother   . Colon cancer Mother   . Cancer Brother        lung cancer     Social History Mr. Eudy reports that he quit smoking about 38 years ago. His smoking use included cigarettes. He has a 30.00 pack-year smoking history. He has never used smokeless tobacco. Mr. Luckey reports no history of alcohol use.   Review of Systems CONSTITUTIONAL: No weight loss, fever, chills, weakness or fatigue.  HEENT: Eyes: No visual loss, blurred vision, double vision or yellow sclerae.No hearing loss, sneezing, congestion, runny nose or sore throat.  SKIN: No rash or itching.  CARDIOVASCULAR: per hpi RESPIRATORY: No shortness of  breath, cough or sputum.  GASTROINTESTINAL: No anorexia, nausea, vomiting or diarrhea. No abdominal pain or blood.  GENITOURINARY: No burning on urination, no polyuria NEUROLOGICAL: No headache, dizziness, syncope, paralysis, ataxia, numbness or tingling in the extremities. No change in bowel or bladder control.  MUSCULOSKELETAL: No muscle, back pain, joint pain or stiffness.  LYMPHATICS: No enlarged nodes. No history of splenectomy.  PSYCHIATRIC: No history of depression or anxiety.  ENDOCRINOLOGIC: No reports of sweating, cold or heat intolerance. No polyuria or polydipsia.  [name]   Physical Examination Today's Vitals   03/18/20 1055  BP: 140/62  Pulse: 65  SpO2: 95%  Weight: 171 lb (77.6 kg)  Height: 5\' 10"  (1.778 m)   Body mass index is 24.54 kg/m.  Gen: resting comfortably, no acute distress HEENT: no scleral icterus, pupils equal round and reactive, no palptable cervical adenopathy,  CV: RRR, no m/r/g, no jvd Resp: Clear to auscultation bilaterally GI: abdomen is soft, non-tender, non-distended, normal bowel sounds, no hepatosplenomegaly MSK: extremities are warm, no edema.  Skin: warm, no rash Neuro:  no focal deficits Psych: appropriate affect   Diagnostic Studies 08/2003 cath CLINICAL HISTORY: Mr. Bille is 85 years old and had a remote PTCA of the right coronary artery by Dr. Shelva Majestic and was admitted yesterday with chest pain and minimal ST elevation in the inferior leads. He was studied by Dr. Ethelle Lyon and found to have a chronically totally occluded right coronary artery which was small, and 80% to 90% stenosis near the ostium of the circumflex artery with 70% to 80% ostial stenosis of the first marginal branch. Dr. Albertine Patricia stented the proximal circumflex artery with a TAXUS stent, feeling this was the culprit vessel. The patient developed some recurrent chest discomfort last night and today, and for this reason was brought back to the  laboratory for reevaluation. His EKG showed slight inferior ST elevation.  PROCEDURE: The procedure was performed via the left femoral artery using arterial sheath and a left diagnostic catheter. Femoral arterial puncture was performed and Omnipaque contrast was used. The left femoral artery was closed with AngioSeal at the end of the procedure. The patient tolerated the procedure well and left the laboratory in satisfactory condition.  RESULTS: The stent in the proximal circumflex artery which crossed the first marginal branch was widely patent with no stenosis. The first marginal branch was narrowed at the ostium to 70% to 80%. This appeared better than on the immediate post-stenting film from yesterday. There was also a 30% to 40% narrowing downstream in the circumflex artery.  CONCLUSION: Widely patent  stent in the proximal circumflex artery with 80% narrowing in a side branch, but with brisk TIMI-3 flow.  RECOMMENDATIONS: The stent is widely patent and the side branch has brisk TIMI flow and the ostial stenosis appears better than the post-procedure films. I think it is very unlikely that the patient's symptoms are related to the marginal branch narrowing. His symptoms may be related to his Plavix and we will plan to treat him with Pepcid and continued observation.   04/2874 cath COMPLICATIONS: None.  FINDINGS: 1. LV 114/5/13. EF approximately 60% without regional wall motion abnormality in an RAO projection. 2. No aortic stenosis or mitral regurgitation. 3. Left main: Angiographically normal. 4. LAD: Large vessel wrapping the apex. There is a 50% stenosis of the proximal vessel. 5. Circumflex: Hazy, approximately 80% stenosis of the proximal vessel involving the takeoff of moderate sized first obtuse marginal. This first marginal had an 80% stenosis. The AV groove circumflex was stented to no residual  stenosis. There remained a 70% stenosis in the obtuse marginal. TIMI 3 flow was maintained in both the circumflex proper and the first obtuse marginal. 6. RCA: Moderate sized, dominant vessel. There is a total occlusion of the mid vessel after the takeoff of the acute marginal. There are bridging collaterals and modest left to right collaterals. The bridging collaterals are very suggestive of chronic total occlusion. Films are reviewed with Dr. Olevia Perches who agreed.  IMPRESSION/RECOMMENDATIONS: Successful drug-eluting stent placement in the culprit lesion of the proximal circumflex. The patient tolerated the procedure well. Eptifibatide will be continued for 18 hours. Sheaths will be removed when the ACT is less than 175 seconds.  The patient has agreed to participate in the TRITON study comparing Plavix with a novel thienopyridine. He will be continued on this study drug for approximately one year. He, thus, should not receive any Plavix through that period. Aspirin will be continued indefinitely.  11/2019 carotid US Summary:  Right Carotid: Velocities in the right ICA are consistent with a 1-39%  stenosis.   Left Carotid: Velocities in the left ICA are consistent with a 1-39%  stenosis.   Vertebrals: Right vertebral artery demonstrates antegrade flow. Left  vertebral        artery demonstrates retrograde flow.  Subclavians: Left subclavian artery was stenotic. Normal flow hemodynamics  were        seen in the right subclavian artery.     Assessment and Plan   1. CAD - no recnet symptoms, continue current meds - EKG today shows SR, no acute ischemic changes   2. HTN - home bp's are overall at goal, continue current meds  3. Hyperlipidemia - has been on zocor long time - continue statin, request pcp labs  F/u 6 months    Arnoldo Lenis, M.D.

## 2020-03-18 NOTE — Patient Instructions (Signed)
Your physician wants you to follow-up in: 6 MONTHS WITH DR BRANCH   Your physician recommends that you continue on your current medications as directed. Please refer to the Current Medication list given to you today.  Thank you for choosing [facility]!!   

## 2020-04-05 DIAGNOSIS — M542 Cervicalgia: Secondary | ICD-10-CM | POA: Diagnosis not present

## 2020-04-05 DIAGNOSIS — Z6826 Body mass index (BMI) 26.0-26.9, adult: Secondary | ICD-10-CM | POA: Diagnosis not present

## 2020-04-12 DIAGNOSIS — M542 Cervicalgia: Secondary | ICD-10-CM | POA: Diagnosis not present

## 2020-04-21 DIAGNOSIS — M542 Cervicalgia: Secondary | ICD-10-CM | POA: Diagnosis not present

## 2020-04-21 DIAGNOSIS — S13120A Subluxation of C1/C2 cervical vertebrae, initial encounter: Secondary | ICD-10-CM | POA: Diagnosis not present

## 2020-05-11 DIAGNOSIS — M542 Cervicalgia: Secondary | ICD-10-CM | POA: Diagnosis not present

## 2020-05-11 DIAGNOSIS — S13120A Subluxation of C1/C2 cervical vertebrae, initial encounter: Secondary | ICD-10-CM | POA: Diagnosis not present

## 2020-05-16 DIAGNOSIS — M542 Cervicalgia: Secondary | ICD-10-CM | POA: Diagnosis not present

## 2020-05-25 ENCOUNTER — Other Ambulatory Visit: Payer: Self-pay | Admitting: Family Medicine

## 2020-05-25 ENCOUNTER — Ambulatory Visit (HOSPITAL_COMMUNITY)
Admission: RE | Admit: 2020-05-25 | Discharge: 2020-05-25 | Disposition: A | Discharge status: Home / Self Care | Payer: Medicare HMO | Source: Ambulatory Visit | Attending: Family Medicine | Admitting: Family Medicine

## 2020-05-25 DIAGNOSIS — R600 Localized edema: Secondary | ICD-10-CM | POA: Diagnosis not present

## 2020-05-25 DIAGNOSIS — I10 Essential (primary) hypertension: Secondary | ICD-10-CM | POA: Diagnosis not present

## 2020-05-25 DIAGNOSIS — Z6825 Body mass index (BMI) 25.0-25.9, adult: Secondary | ICD-10-CM | POA: Diagnosis not present

## 2020-05-31 DIAGNOSIS — M47812 Spondylosis without myelopathy or radiculopathy, cervical region: Secondary | ICD-10-CM | POA: Diagnosis not present

## 2020-05-31 DIAGNOSIS — M542 Cervicalgia: Secondary | ICD-10-CM | POA: Diagnosis not present

## 2020-06-03 ENCOUNTER — Other Ambulatory Visit: Payer: Self-pay

## 2020-06-03 ENCOUNTER — Ambulatory Visit: Payer: Medicare HMO | Admitting: Physician Assistant

## 2020-06-03 ENCOUNTER — Ambulatory Visit (HOSPITAL_COMMUNITY)
Admission: RE | Admit: 2020-06-03 | Discharge: 2020-06-03 | Disposition: A | Discharge status: Home / Self Care | Payer: Medicare HMO | Source: Ambulatory Visit | Attending: Vascular Surgery | Admitting: Vascular Surgery

## 2020-06-03 VITALS — BP 159/76 | HR 63 | Temp 97.6°F | Resp 18 | Ht 70.0 in | Wt 167.0 lb

## 2020-06-03 DIAGNOSIS — I714 Abdominal aortic aneurysm, without rupture, unspecified: Secondary | ICD-10-CM

## 2020-06-03 DIAGNOSIS — J111 Influenza due to unidentified influenza virus with other respiratory manifestations: Secondary | ICD-10-CM | POA: Insufficient documentation

## 2020-06-03 DIAGNOSIS — W57XXXA Bitten or stung by nonvenomous insect and other nonvenomous arthropods, initial encounter: Secondary | ICD-10-CM | POA: Insufficient documentation

## 2020-06-03 DIAGNOSIS — B349 Viral infection, unspecified: Secondary | ICD-10-CM | POA: Insufficient documentation

## 2020-06-03 NOTE — Progress Notes (Signed)
Office Note    CC:  follow up Requesting Provider:  Manon Hilding, MD  HPI: Justin Lyons is a 85 y.o. ([birth year]) male who presents for surveillance of endovascular repair of abdominal aortic aneurysm which was performed by Dr. Trula Slade on 04/07/2017.  The patient denies any new or changing abdominal or back pain.  He also denies any claudication, rest pain, or nonhealing wounds of bilateral lower extremities.  He is on aspirin and statin daily.  He is a former smoker.   Past Medical History:  Diagnosis Date  . AAA (abdominal aortic aneurysm) ([place])    06/02/2003 old BPG  . Arthritis   . Carotid artery occlusion   . Carotid bruit   . COPD (chronic obstructive pulmonary disease) ([place])   . Coronary artery disease   . Diverticulosis of colon   . Erectile dysfunction   . Hemorrhoids, internal   . History of compression fracture of spine   . History of gout   . Hyperlipidemia   . Hypertension   . Knee pain   . Myocardial infarction ([place]) 2005  . Peripheral vascular disease [facility])     Past Surgical History:  Procedure Laterality Date  . ABDOMINAL AORTIC ENDOVASCULAR STENT GRAFT N/A 04/07/2017   Procedure: ENDOVASCULAR ABDOMINAL ANEURYSM REPAIR;  Surgeon: Serafina Mitchell, MD;  Location: [place];  Service: Vascular;  Laterality: N/A;  . CORONARY ANGIOPLASTY  2005    Social History   Socioeconomic History  . Marital status: Single    Spouse name: Not on file  . Number of children: Not on file  . Years of education: Not on file  . Highest education level: Not on file  Occupational History  . Occupation: part time in a pharmacy    Employer: RETIRED  Tobacco Use  . Smoking status: Former Smoker    Packs/day: 1.50    Years: 20.00    Pack years: 30.00    Types: Cigarettes    Quit date: 01/22/1982    Years since quitting: 38.3  . Smokeless tobacco: Never Used  Vaping Use  . Vaping Use: Never used  Substance and Sexual Activity  . Alcohol use: No  . Drug use: No  . Sexual  activity: Not on file  Other Topics Concern  . Not on file  Social History Narrative  . Not on file   Social Determinants of Health   Financial Resource Strain: Not on file  Food Insecurity: Not on file  Transportation Needs: Not on file  Physical Activity: Not on file  Stress: Not on file  Social Connections: Not on file  Intimate Partner Violence: Not on file    Family History  Problem Relation Age of Onset  . Heart disease Father        Heart Disease before age 58  . Heart attack Father   . Cancer Mother        Colon  . Heart disease Mother   . Hyperlipidemia Mother   . Hypertension Mother   . Heart attack Mother   . Colon cancer Mother   . Cancer Brother        lung cancer    Current Outpatient Medications  Medication Sig Dispense Refill  . albuterol (PROVENTIL) (2.5 MG/3ML) 0.083% nebulizer solution     . albuterol (VENTOLIN HFA) 108 (90 Base) MCG/ACT inhaler     . allopurinol (ZYLOPRIM) 100 MG tablet Take 100 mg by mouth daily.    [name] aspirin 81 MG tablet Take 81  mg by mouth daily.    . Cholecalciferol (VITAMIN D) 1000 UNITS capsule Take 1,000 Units by mouth daily.    . metoprolol tartrate (LOPRESSOR) 25 MG tablet Take 25 mg by mouth 2 (two) times daily.     . nitroGLYCERIN (NITROSTAT) 0.4 MG SL tablet Place 1 tablet (0.4 mg total) under the tongue every 5 (five) minutes as needed. 25 tablet 3  . pantoprazole (PROTONIX) 40 MG tablet Take 1 tablet (40 mg total) by mouth daily. 90 tablet 3  . simvastatin (ZOCOR) 40 MG tablet Take 1 tablet (40 mg total) by mouth every evening. 90 tablet 3   No current facility-administered medications for this visit.    Allergies  Allergen Reactions  . Doxycycline Rash  . Penicillins Rash  . Sulfonamide Derivatives Rash     REVIEW OF SYSTEMS:   [X]  denotes positive finding, [ ]  denotes negative finding Cardiac  Comments:  Chest pain or chest pressure:    Shortness of breath upon exertion:    Short of breath when lying  flat:    Irregular heart rhythm:        Vascular    Pain in calf, thigh, or hip brought on by ambulation:    Pain in feet at night that wakes you up from your sleep:     Blood clot in your veins:    Leg swelling:         Pulmonary    Oxygen at home:    Productive cough:     Wheezing:         Neurologic    Sudden weakness in arms or legs:     Sudden numbness in arms or legs:     Sudden onset of difficulty speaking or slurred speech:    Temporary loss of vision in one eye:     Problems with dizziness:         Gastrointestinal    Blood in stool:     Vomited blood:         Genitourinary    Burning when urinating:     Blood in urine:        Psychiatric    Major depression:         Hematologic    Bleeding problems:    Problems with blood clotting too easily:        Skin    Rashes or ulcers:        Constitutional    Fever or chills:      PHYSICAL EXAMINATION:  Vitals:   06/03/20 0823  BP: (!) 159/76  Pulse: 63  Resp: 18  Temp: 97.6 F (36.4 C)  TempSrc: Temporal  SpO2: 96%  Weight: 167 lb (75.8 kg)  Height: 5\' 10"  (1.778 m)    General:  WDWN in NAD; vital signs documented above Gait: Not observed HENT: WNL, normocephalic Pulmonary: normal non-labored breathing , without Rales, rhonchi,  wheezing Cardiac: regular HR Abdomen: soft, NT, no masses Skin: without rashes Vascular Exam/Pulses:  Right Left  Radial 2+ (normal) 2+ (normal)  DP absent 2+ (normal)  PT absent 1+ (weak)   Extremities: without ischemic changes, without Gangrene , without cellulitis; without open wounds;  Musculoskeletal: no muscle wasting or atrophy  Neurologic: A&O X 3;  No focal weakness or paresthesias are detected Psychiatric:  The pt has Normal affect.   Non-Invasive Vascular Imaging:   4.8 cm maximal diameter of AAA sac    ASSESSMENT/PLAN:: 85 y.o. male here for follow up for  surveillance of EVAR  -Subjectively patient has not had any new or changing abdominal or  back symptoms -EVAR duplex demonstrates a 4.8 cm AAA sac without any endoleaks -Continue aspirin and statin daily -Recheck EVAR duplex in 1 year   Dagoberto Ligas, PA-C [facility] [phone]  Clinic MD:   Donzetta Matters

## 2020-06-20 DIAGNOSIS — E7849 Other hyperlipidemia: Secondary | ICD-10-CM | POA: Diagnosis not present

## 2020-06-20 DIAGNOSIS — I2510 Atherosclerotic heart disease of native coronary artery without angina pectoris: Secondary | ICD-10-CM | POA: Diagnosis not present

## 2020-06-20 DIAGNOSIS — I10 Essential (primary) hypertension: Secondary | ICD-10-CM | POA: Diagnosis not present

## 2020-06-20 DIAGNOSIS — Z87891 Personal history of nicotine dependence: Secondary | ICD-10-CM | POA: Diagnosis not present

## 2020-07-04 DIAGNOSIS — M47812 Spondylosis without myelopathy or radiculopathy, cervical region: Secondary | ICD-10-CM | POA: Diagnosis not present

## 2020-07-26 DIAGNOSIS — M47812 Spondylosis without myelopathy or radiculopathy, cervical region: Secondary | ICD-10-CM | POA: Diagnosis not present

## 2020-08-18 DIAGNOSIS — M47812 Spondylosis without myelopathy or radiculopathy, cervical region: Secondary | ICD-10-CM | POA: Diagnosis not present

## 2020-08-21 DIAGNOSIS — Z87891 Personal history of nicotine dependence: Secondary | ICD-10-CM | POA: Diagnosis not present

## 2020-08-21 DIAGNOSIS — I10 Essential (primary) hypertension: Secondary | ICD-10-CM | POA: Diagnosis not present

## 2020-08-21 DIAGNOSIS — I2510 Atherosclerotic heart disease of native coronary artery without angina pectoris: Secondary | ICD-10-CM | POA: Diagnosis not present

## 2020-08-21 DIAGNOSIS — E7849 Other hyperlipidemia: Secondary | ICD-10-CM | POA: Diagnosis not present

## 2020-09-01 DIAGNOSIS — L570 Actinic keratosis: Secondary | ICD-10-CM | POA: Diagnosis not present

## 2020-09-01 DIAGNOSIS — D044 Carcinoma in situ of skin of scalp and neck: Secondary | ICD-10-CM | POA: Diagnosis not present

## 2020-09-01 DIAGNOSIS — L821 Other seborrheic keratosis: Secondary | ICD-10-CM | POA: Diagnosis not present

## 2020-09-01 DIAGNOSIS — D692 Other nonthrombocytopenic purpura: Secondary | ICD-10-CM | POA: Diagnosis not present

## 2020-09-01 DIAGNOSIS — Z85828 Personal history of other malignant neoplasm of skin: Secondary | ICD-10-CM | POA: Diagnosis not present

## 2020-09-01 DIAGNOSIS — D1801 Hemangioma of skin and subcutaneous tissue: Secondary | ICD-10-CM | POA: Diagnosis not present

## 2020-09-02 DIAGNOSIS — I10 Essential (primary) hypertension: Secondary | ICD-10-CM | POA: Diagnosis not present

## 2020-09-13 DIAGNOSIS — H52223 Regular astigmatism, bilateral: Secondary | ICD-10-CM | POA: Diagnosis not present

## 2020-09-13 DIAGNOSIS — H5203 Hypermetropia, bilateral: Secondary | ICD-10-CM | POA: Diagnosis not present

## 2020-09-13 DIAGNOSIS — H25813 Combined forms of age-related cataract, bilateral: Secondary | ICD-10-CM | POA: Diagnosis not present

## 2020-09-13 DIAGNOSIS — H524 Presbyopia: Secondary | ICD-10-CM | POA: Diagnosis not present

## 2020-09-29 DIAGNOSIS — D044 Carcinoma in situ of skin of scalp and neck: Secondary | ICD-10-CM | POA: Diagnosis not present

## 2020-09-29 DIAGNOSIS — Z85828 Personal history of other malignant neoplasm of skin: Secondary | ICD-10-CM | POA: Diagnosis not present

## 2020-10-05 ENCOUNTER — Other Ambulatory Visit: Payer: Self-pay

## 2020-10-05 ENCOUNTER — Encounter: Payer: Self-pay | Admitting: *Deleted

## 2020-10-05 ENCOUNTER — Ambulatory Visit: Payer: Medicare HMO | Admitting: Cardiology

## 2020-10-05 ENCOUNTER — Encounter: Payer: Self-pay | Admitting: Cardiology

## 2020-10-05 VITALS — BP 130/70 | HR 66 | Ht 70.0 in | Wt 164.2 lb

## 2020-10-05 DIAGNOSIS — E782 Mixed hyperlipidemia: Secondary | ICD-10-CM

## 2020-10-05 DIAGNOSIS — I10 Essential (primary) hypertension: Secondary | ICD-10-CM | POA: Diagnosis not present

## 2020-10-05 DIAGNOSIS — I2510 Atherosclerotic heart disease of native coronary artery without angina pectoris: Secondary | ICD-10-CM

## 2020-10-05 NOTE — Patient Instructions (Addendum)
Medication Instructions:  Continue all current medications.   Labwork: none  Testing/Procedures: none  Follow-Up: 6 months   Any Other Special Instructions Will Be Listed Below (If Applicable).   If you need a refill on your cardiac medications before your next appointment, please call your pharmacy.  

## 2020-10-05 NOTE — Progress Notes (Signed)
Clinical Summary [name] is a 85 y.o.male seen today for follow up of the following medical problems.    1. CAD - remote history of caths somewhat unclear. From available clinic notes DES to LCX in 2005. Notes mention prior to that a remote history of PTCA to RCA        - no chest pains. No SOB/DOE - compliant with meds     2. AAA - s/p endovascular repair - followed by vascular, last appt 05/2020   3. Carotid stenosis with left subclavian stenosis - 11/2019 [place]  mild bilaterally.  - bp's need to be checked in right arm   - no neuro symptoms   4. COPD - managed by pcp   5. HTN - had some low bp's, pcp lowered lopressor to 12.'5mg'$  bid - due to left subclavian stenosis, bp's must be measured in right arm   - compliant with meds   6. Hyperlipidemia Jan 2020 TC 140 TG 135 HDL 35 LDL 78 - labs followed by pcp    Past Medical History:  Diagnosis Date   AAA (abdominal aortic aneurysm) ([place])    06/02/2003 old BPG   Arthritis    Carotid artery occlusion    Carotid bruit    COPD (chronic obstructive pulmonary disease) (HCC)    Coronary artery disease    Diverticulosis of colon    Erectile dysfunction    Hemorrhoids, internal    History of compression fracture of spine    History of gout    Hyperlipidemia    Hypertension    Knee pain    Myocardial infarction [facility]) 2005   Peripheral vascular disease (HCC)      Allergies  Allergen Reactions   Doxycycline Rash   Penicillins Rash   Sulfonamide Derivatives Rash     Current Outpatient Medications  Medication Sig Dispense Refill   albuterol (PROVENTIL) (2.5 MG/3ML) 0.083% nebulizer solution      albuterol (VENTOLIN HFA) 108 (90 Base) MCG/ACT inhaler      allopurinol (ZYLOPRIM) 100 MG tablet Take 100 mg by mouth daily.     aspirin 81 MG tablet Take 81 mg by mouth daily.     Cholecalciferol (VITAMIN D) 1000 UNITS capsule Take 1,000 Units by mouth daily.     metoprolol tartrate (LOPRESSOR) 25 MG tablet  Take 25 mg by mouth 2 (two) times daily.      nitroGLYCERIN (NITROSTAT) 0.4 MG SL tablet Place 1 tablet (0.4 mg total) under the tongue every 5 (five) minutes as needed. 25 tablet 3   pantoprazole (PROTONIX) 40 MG tablet Take 1 tablet (40 mg total) by mouth daily. 90 tablet 3   simvastatin (ZOCOR) 40 MG tablet Take 1 tablet (40 mg total) by mouth every evening. 90 tablet 3   No current facility-administered medications for this visit.     Past Surgical History:  Procedure Laterality Date   ABDOMINAL AORTIC ENDOVASCULAR STENT GRAFT N/A 04/07/2017   Procedure: ENDOVASCULAR ABDOMINAL ANEURYSM REPAIR;  Surgeon: Serafina Mitchell, MD;  Location: [facility] OR;  Service: Vascular;  Laterality: N/A;   CORONARY ANGIOPLASTY  2005     Allergies  Allergen Reactions   Doxycycline Rash   Penicillins Rash   Sulfonamide Derivatives Rash      Family History  Problem Relation Age of Onset   Heart disease Father        Heart Disease before age 56   Heart attack Father    Cancer Mother  Colon   Heart disease Mother    Hyperlipidemia Mother    Hypertension Mother    Heart attack Mother    Colon cancer Mother    Cancer Brother        lung cancer     Social History [name] reports that he quit smoking about 38 years ago. His smoking use included cigarettes. He has a 30.00 pack-year smoking history. He has never used smokeless tobacco. [name] reports no history of alcohol use.   Review of Systems CONSTITUTIONAL: No weight loss, fever, chills, weakness or fatigue.  HEENT: Eyes: No visual loss, blurred vision, double vision or yellow sclerae.No hearing loss, sneezing, congestion, runny nose or sore throat.  SKIN: No rash or itching.  CARDIOVASCULAR: per hpi RESPIRATORY: No shortness of breath, cough or sputum.  GASTROINTESTINAL: No anorexia, nausea, vomiting or diarrhea. No abdominal pain or blood.  GENITOURINARY: No burning on urination, no polyuria NEUROLOGICAL: No headache,  dizziness, syncope, paralysis, ataxia, numbness or tingling in the extremities. No change in bowel or bladder control.  MUSCULOSKELETAL: No muscle, back pain, joint pain or stiffness.  LYMPHATICS: No enlarged nodes. No history of splenectomy.  PSYCHIATRIC: No history of depression or anxiety.  ENDOCRINOLOGIC: No reports of sweating, cold or heat intolerance. No polyuria or polydipsia.  [name]   Physical Examination Today's Vitals   10/05/20 0955  BP: 130/70  Pulse: 66  SpO2: 97%  Weight: 164 lb 3.2 oz (74.5 kg)  Height: '5\' 10"'$  (1.778 m)   Body mass index is 23.56 kg/m.  Gen: resting comfortably, no acute distress HEENT: no scleral icterus, pupils equal round and reactive, no palptable cervical adenopathy,  CV: RRR, no m/r/g no jvd Resp: Clear to auscultation bilaterally GI: abdomen is soft, non-tender, non-distended, normal bowel sounds, no hepatosplenomegaly MSK: extremities are warm, no edema.  Skin: warm, no rash Neuro:  no focal deficits Psych: appropriate affect   Diagnostic Studies 08/2003 cath CLINICAL HISTORY:  [name] is 85 years old and had a remote PTCA of the  right coronary artery by Dr. Shelva Majestic and was admitted yesterday with  chest pain and minimal ST elevation in the inferior leads.  He was studied  by Dr. Ethelle Lyon and found to have a chronically totally occluded  right coronary artery which was small, and 80% to 90% stenosis near the  ostium of the circumflex artery with 70% to 80% ostial stenosis of the first  marginal Ladonna Justin Lyons.  Dr. Albertine Patricia stented the proximal circumflex artery with a  TAXUS stent, feeling this was the culprit vessel.  The patient developed  some recurrent chest discomfort last night and today, and for this reason  was brought back to the laboratory for reevaluation.  His EKG showed slight  inferior ST elevation.    PROCEDURE:  The procedure was performed via the left femoral artery using  arterial sheath and a left diagnostic  catheter.  Femoral arterial puncture  was performed and Omnipaque contrast was used.  The left femoral artery was  closed with AngioSeal at the end of the procedure.  The patient tolerated  the procedure well and left the laboratory in satisfactory condition.    RESULTS:  The stent in the proximal circumflex artery which crossed the  first marginal Sandon Yoho was widely patent with no stenosis.  The first  marginal Slayde Brault was narrowed at the ostium to 70% to 80%.  This appeared  better than on the immediate post-stenting film from yesterday.  There was  also a 30% to 40% narrowing downstream in the circumflex artery.    CONCLUSION:  Widely patent stent in the proximal circumflex artery with 80%  narrowing in a side Darsha Zumstein, but with brisk TIMI-3 flow.    RECOMMENDATIONS:  The stent is widely patent and the side Jamille Yoshino has brisk  TIMI flow and the ostial stenosis appears better than the post-procedure  films.  I think it is very unlikely that the patient's symptoms are related  to the marginal Rebeckah Masih narrowing.  His symptoms may be related to his Plavix  and we will plan to treat him with Pepcid and continued observation.     123456 cath COMPLICATIONS:  None.    FINDINGS:  1. LV 114/5/13.  EF approximately 60% without regional wall motion     abnormality in an RAO projection.  2. No aortic stenosis or mitral regurgitation.  3. Left main:  Angiographically normal.  4. LAD:  Large vessel wrapping the apex.  There is a 50% stenosis of the     proximal vessel.  5. Circumflex:  Hazy, approximately 80% stenosis of the proximal vessel     involving the takeoff of moderate sized first obtuse marginal.  This     first marginal had an 80% stenosis.  The AV groove circumflex was stented     to no residual stenosis.  There remained a 70% stenosis in the obtuse     marginal.  TIMI 3 flow was maintained in both the circumflex proper and     the first obtuse marginal.  6. RCA:  Moderate sized,  dominant vessel.  There is a total occlusion of the     mid vessel after the takeoff of the acute marginal.  There are bridging     collaterals and modest left to right collaterals.  The bridging     collaterals are very suggestive of chronic total occlusion.  Films are     reviewed with Dr. Olevia Perches who agreed.    IMPRESSION/RECOMMENDATIONS:  Successful drug-eluting stent placement in the  culprit lesion of the proximal circumflex.  The patient tolerated the  procedure well.  Eptifibatide will be continued for 18 hours.  Sheaths will  be removed when the ACT is less than 175 seconds.    The patient has agreed to participate in the TRITON study comparing Plavix  with a novel thienopyridine.  He will be continued on this study drug for  approximately one year.  He, thus, should not receive any Plavix through  that period.  Aspirin will be continued indefinitely.   11/2019 carotid US Summary:  Right Carotid: Velocities in the right ICA are consistent with a 1-39%  stenosis.   Left Carotid: Velocities in the left ICA are consistent with a 1-39%  stenosis.   Vertebrals:  Right vertebral artery demonstrates antegrade flow. Left  vertebral               artery demonstrates retrograde flow.  Subclavians: Left subclavian artery was stenotic. Normal flow hemodynamics  were               seen in the right subclavian artery.       Assessment and Plan  1. CAD - no symptoms, continue current meds     2. HTN - he is at goal, continue current meds   3. Hyperlipidemia - has been on zocor long time - request pcp labs, continue statin      Arnoldo Lenis, M.D.

## 2020-10-13 DIAGNOSIS — M47812 Spondylosis without myelopathy or radiculopathy, cervical region: Secondary | ICD-10-CM | POA: Diagnosis not present

## 2020-10-13 DIAGNOSIS — I10 Essential (primary) hypertension: Secondary | ICD-10-CM | POA: Diagnosis not present

## 2020-10-13 DIAGNOSIS — M542 Cervicalgia: Secondary | ICD-10-CM | POA: Diagnosis not present

## 2020-10-31 DIAGNOSIS — D519 Vitamin B12 deficiency anemia, unspecified: Secondary | ICD-10-CM | POA: Diagnosis not present

## 2020-10-31 DIAGNOSIS — J449 Chronic obstructive pulmonary disease, unspecified: Secondary | ICD-10-CM | POA: Diagnosis not present

## 2020-10-31 DIAGNOSIS — E782 Mixed hyperlipidemia: Secondary | ICD-10-CM | POA: Diagnosis not present

## 2020-10-31 DIAGNOSIS — E7849 Other hyperlipidemia: Secondary | ICD-10-CM | POA: Diagnosis not present

## 2020-10-31 DIAGNOSIS — D529 Folate deficiency anemia, unspecified: Secondary | ICD-10-CM | POA: Diagnosis not present

## 2020-10-31 DIAGNOSIS — I10 Essential (primary) hypertension: Secondary | ICD-10-CM | POA: Diagnosis not present

## 2020-10-31 DIAGNOSIS — D649 Anemia, unspecified: Secondary | ICD-10-CM | POA: Diagnosis not present

## 2020-11-03 DIAGNOSIS — H2513 Age-related nuclear cataract, bilateral: Secondary | ICD-10-CM | POA: Diagnosis not present

## 2020-11-03 DIAGNOSIS — H25013 Cortical age-related cataract, bilateral: Secondary | ICD-10-CM | POA: Diagnosis not present

## 2020-11-03 DIAGNOSIS — H18413 Arcus senilis, bilateral: Secondary | ICD-10-CM | POA: Diagnosis not present

## 2020-11-03 DIAGNOSIS — H35373 Puckering of macula, bilateral: Secondary | ICD-10-CM | POA: Diagnosis not present

## 2020-11-16 ENCOUNTER — Telehealth: Payer: Self-pay | Admitting: *Deleted

## 2020-11-16 DIAGNOSIS — J441 Chronic obstructive pulmonary disease with (acute) exacerbation: Secondary | ICD-10-CM | POA: Diagnosis not present

## 2020-11-16 DIAGNOSIS — I10 Essential (primary) hypertension: Secondary | ICD-10-CM | POA: Diagnosis not present

## 2020-11-16 NOTE — Telephone Encounter (Signed)
   Patient Name: Justin Lyons  DOB: May 01, 1935 MRN: [id number]  Primary Cardiologist: Carlyle Dolly, MD  Chart reviewed as part of pre-operative protocol coverage. Cataract extractions are recognized in guidelines as low risk surgeries that do not typically require specific preoperative testing or holding of blood thinner therapy. Therefore, given past medical history and time since last visit, based on ACC/AHA guidelines, Justin Lyons would be at acceptable risk for the planned procedure without further cardiovascular testing. Last OV 09/2020.  Please note, when taking patient's blood pressure: per Dr. Nelly Laurence prior notes, due to left subclavian stenosis, blood pressures must be measured in patient's right arm.   I will route this recommendation to the requesting party via Epic fax function and remove from pre-op pool.  Please call with questions.  Charlie Pitter, PA-C 11/16/2020, 1:09 PM

## 2020-11-16 NOTE — Telephone Encounter (Signed)
   [facility] Pre-operative Risk Assessment    Patient Name: Justin Lyons  DOB: 21-Nov-1935 MRN: [id number]  HEARTCARE STAFF:  - IMPORTANT!!!!!! Under Visit Info/Reason for Call, type in Other and utilize the format Clearance MM/DD/YY or Clearance TBD. Do not use dashes or single digits. - Please review there is not already an duplicate clearance open for this procedure. - If request is for dental extraction, please clarify the # of teeth to be extracted. - If the patient is currently at the dentist's office, call Pre-Op Callback Staff (MA/nurse) to input urgent request.  - If the patient is not currently in the dentist office, please route to the Pre-Op pool.  Request for surgical clearance:  What type of surgery is being performed? Cataract Extraction w/intraocular Lens impantation of right eye followed by left eye  When is this surgery scheduled? 11/30/2020 & 01/04/2021  What type of clearance is required (medical clearance vs. Pharmacy clearance to hold med vs. Both)? Medical  Are there any medications that need to be held prior to surgery and how long? no  Practice name and name of physician performing surgery? [facility]/Dr. Bevis/Cheri  What is the office phone number? [phone]   7.   What is the office fax number? [phone]  8.   Anesthesia type (None, local, MAC, general) ? Topical anesthesia w/IV medication   Marlou Sa 11/16/2020, 12:22 PM  _________________________________________________________________   (provider comments below)

## 2020-11-30 DIAGNOSIS — H2511 Age-related nuclear cataract, right eye: Secondary | ICD-10-CM | POA: Diagnosis not present

## 2020-12-01 DIAGNOSIS — H2512 Age-related nuclear cataract, left eye: Secondary | ICD-10-CM | POA: Diagnosis not present

## 2020-12-01 DIAGNOSIS — R059 Cough, unspecified: Secondary | ICD-10-CM | POA: Diagnosis not present

## 2020-12-01 DIAGNOSIS — J441 Chronic obstructive pulmonary disease with (acute) exacerbation: Secondary | ICD-10-CM | POA: Diagnosis not present

## 2020-12-20 DIAGNOSIS — J441 Chronic obstructive pulmonary disease with (acute) exacerbation: Secondary | ICD-10-CM | POA: Diagnosis not present

## 2020-12-20 DIAGNOSIS — Z8701 Personal history of pneumonia (recurrent): Secondary | ICD-10-CM | POA: Diagnosis not present

## 2020-12-20 DIAGNOSIS — R062 Wheezing: Secondary | ICD-10-CM | POA: Diagnosis not present

## 2020-12-20 DIAGNOSIS — R059 Cough, unspecified: Secondary | ICD-10-CM | POA: Diagnosis not present

## 2021-01-04 DIAGNOSIS — H2512 Age-related nuclear cataract, left eye: Secondary | ICD-10-CM | POA: Diagnosis not present

## 2021-01-17 DIAGNOSIS — M47812 Spondylosis without myelopathy or radiculopathy, cervical region: Secondary | ICD-10-CM | POA: Diagnosis not present

## 2021-04-10 ENCOUNTER — Encounter: Payer: Self-pay | Admitting: *Deleted

## 2021-04-10 ENCOUNTER — Encounter: Payer: Self-pay | Admitting: Cardiology

## 2021-04-10 ENCOUNTER — Ambulatory Visit: Payer: Medicare HMO | Admitting: Cardiology

## 2021-04-10 ENCOUNTER — Other Ambulatory Visit: Payer: Self-pay

## 2021-04-10 VITALS — BP 140/80 | HR 68 | Ht 70.0 in | Wt 169.0 lb

## 2021-04-10 DIAGNOSIS — I2510 Atherosclerotic heart disease of native coronary artery without angina pectoris: Secondary | ICD-10-CM | POA: Diagnosis not present

## 2021-04-10 DIAGNOSIS — I10 Essential (primary) hypertension: Secondary | ICD-10-CM

## 2021-04-10 DIAGNOSIS — E782 Mixed hyperlipidemia: Secondary | ICD-10-CM

## 2021-04-10 MED ORDER — ATORVASTATIN CALCIUM 80 MG PO TABS: 80.0000 mg | ORAL_TABLET | Freq: Every day | ORAL | 3 refills | Status: DC

## 2021-04-10 NOTE — Progress Notes (Signed)
? ? ? ?Clinical Summary ?[name] is a 86 y.o.male seen today for follow up of the following medical problems.  ?  ?1. CAD ?- remote history of caths somewhat unclear. From available clinic notes DES to LCX in 2005. Notes mention prior to that a remote history of PTCA to RCA ?  ?  ?  ?  ?- no recent chest pains. No SOB/DOE ?- compliant with meds ?  ?2. AAA ?- s/p endovascular repair ?- followed by vascular, last appt 05/2020. She be due for f/u in next few months ?  ?3. Carotid stenosis with left subclavian stenosis ?- 11/2019 [place]  mild bilaterally.  ?- bp's need to be checked in right arm ?  ? ?4. COPD ?- managed by pcp ?  ?5. HTN ?- had some low bp's, pcp lowered lopressor to 12.'5mg'$  bid ?- due to left subclavian stenosis, bp's must be measured in right arm ?  ?- compliant with meds  ?  ?6. Hyperlipidemia ?Jan 2020 TC 140 TG 135 HDL 35 LDL 78 ?- labs followed by pcp ? ?10/2020 TC 139 TG 116 HDL 36 LDL 82 ?- has been on simva '40mg'$  several years ? ? ? ?Past Medical History:  ?Diagnosis Date  ? AAA (abdominal aortic aneurysm) ([place])   ? 06/02/2003 old BPG  ? Arthritis   ? Carotid artery occlusion   ? Carotid bruit   ? COPD (chronic obstructive pulmonary disease) ([place])   ? Coronary artery disease   ? Diverticulosis of colon   ? Erectile dysfunction   ? Hemorrhoids, internal   ? History of compression fracture of spine   ? History of gout   ? Hyperlipidemia   ? Hypertension   ? Knee pain   ? Myocardial infarction [facility] 2005  ? Peripheral vascular disease ([place])   ? ? ? ?Allergies  ?Allergen Reactions  ? Doxycycline Rash  ? Penicillins Rash  ? Sulfonamide Derivatives Rash  ? ? ? ?Current Outpatient Medications  ?Medication Sig Dispense Refill  ? albuterol (PROVENTIL) (2.5 MG/3ML) 0.083% nebulizer solution     ? albuterol (VENTOLIN HFA) 108 (90 Base) MCG/ACT inhaler     ? allopurinol (ZYLOPRIM) 100 MG tablet Take 100 mg by mouth daily.    ? aspirin 81 MG tablet Take 81 mg by mouth daily.    ? Cholecalciferol (VITAMIN D)  1000 UNITS capsule Take 1,000 Units by mouth daily.    ? diclofenac (VOLTAREN) 75 MG EC tablet Take 1 tablet by mouth 2 (two) times daily.    ? metoprolol tartrate (LOPRESSOR) 25 MG tablet Take 25 mg by mouth 2 (two) times daily.     ? nitroGLYCERIN (NITROSTAT) 0.4 MG SL tablet Place 1 tablet (0.4 mg total) under the tongue every 5 (five) minutes as needed. 25 tablet 3  ? pantoprazole (PROTONIX) 40 MG tablet Take 1 tablet (40 mg total) by mouth daily. 90 tablet 3  ? simvastatin (ZOCOR) 40 MG tablet Take 40 mg by mouth daily.    ? ?No current facility-administered medications for this visit.  ? ? ? ?Past Surgical History:  ?Procedure Laterality Date  ? ABDOMINAL AORTIC ENDOVASCULAR STENT GRAFT N/A 04/07/2017  ? Procedure: ENDOVASCULAR ABDOMINAL ANEURYSM REPAIR;  Surgeon: Serafina Mitchell, MD;  Location: [place];  Service: Vascular;  Laterality: N/A;  ? CORONARY ANGIOPLASTY  2005  ? ? ? ?Allergies  ?Allergen Reactions  ? Doxycycline Rash  ? Penicillins Rash  ? Sulfonamide Derivatives Rash  ? ? ? ? ?Family  History  ?Problem Relation Age of Onset  ? Heart disease Father   ?     Heart Disease before age 28  ? Heart attack Father   ? Cancer Mother   ?     Colon  ? Heart disease Mother   ? Hyperlipidemia Mother   ? Hypertension Mother   ? Heart attack Mother   ? Colon cancer Mother   ? Cancer Brother   ?     lung cancer  ? ? ? ?Social History ?[name] reports that he quit smoking about 39 years ago. His smoking use included cigarettes. He has a 30.00 pack-year smoking history. He has never used smokeless tobacco. ?[name] reports no history of alcohol use. ? ? ?Review of Systems ?CONSTITUTIONAL: No weight loss, fever, chills, weakness or fatigue.  ?HEENT: Eyes: No visual loss, blurred vision, double vision or yellow sclerae.No hearing loss, sneezing, congestion, runny nose or sore throat.  ?SKIN: No rash or itching.  ?CARDIOVASCULAR: per hpi ?RESPIRATORY: No shortness of breath, cough or sputum.  ?GASTROINTESTINAL: No  anorexia, nausea, vomiting or diarrhea. No abdominal pain or blood.  ?GENITOURINARY: No burning on urination, no polyuria ?NEUROLOGICAL: No headache, dizziness, syncope, paralysis, ataxia, numbness or tingling in the extremities. No change in bowel or bladder control.  ?MUSCULOSKELETAL: No muscle, back pain, joint pain or stiffness.  ?LYMPHATICS: No enlarged nodes. No history of splenectomy.  ?PSYCHIATRIC: No history of depression or anxiety.  ?ENDOCRINOLOGIC: No reports of sweating, cold or heat intolerance. No polyuria or polydipsia.  ?. ? ? ?Physical Examination ?Today's Vitals  ? 04/10/21 0956  ?BP: 140/80  ?Pulse: 68  ?SpO2: 97%  ?Weight: 169 lb (76.7 kg)  ?Height: '5\' 10"'$  (1.778 m)  ? ?Body mass index is 24.25 kg/m?. ? ?Gen: resting comfortably, no acute distress ?HEENT: no scleral icterus, pupils equal round and reactive, no palptable cervical adenopathy,  ?CV: RRR, no m/r/g no jvd ?Resp: Clear to auscultation bilaterally ?GI: abdomen is soft, non-tender, non-distended, normal bowel sounds, no hepatosplenomegaly ?MSK: extremities are warm, no edema.  ?Skin: warm, no rash ?Neuro:  no focal deficits ?Psych: appropriate affect ? ? ?Diagnostic Studies ? ?08/2003 cath ?CLINICAL HISTORY:  [name] is 86 years old and had a remote PTCA of the ? right coronary artery by Dr. Shelva Majestic and was admitted yesterday with ? chest pain and minimal ST elevation in the inferior leads.  He was studied ? by Dr. Ethelle Lyons and found to have a chronically totally occluded ? right coronary artery which was small, and 80% to 90% stenosis near the ? ostium of the circumflex artery with 70% to 80% ostial stenosis of the first ? marginal Justin Lyons.  Dr. Albertine Patricia stented the proximal circumflex artery with a ? TAXUS stent, feeling this was the culprit vessel.  The patient developed ? some recurrent chest discomfort last night and today, and for this reason ? was brought back to the laboratory for reevaluation.  His EKG showed  slight ? inferior ST elevation. ?  ? PROCEDURE:  The procedure was performed via the left femoral artery using ? arterial sheath and a left diagnostic catheter.  Femoral arterial puncture ? was performed and Omnipaque contrast was used.  The left femoral artery was ? closed with AngioSeal at the end of the procedure.  The patient tolerated ? the procedure well and left the laboratory in satisfactory condition. ?  ? RESULTS:  The stent in the proximal circumflex artery which crossed the ?  first marginal Levi Crass was widely patent with no stenosis.  The first ? marginal Claudean Leavelle was narrowed at the ostium to 70% to 80%.  This appeared ? better than on the immediate post-stenting film from yesterday.  There was ? also a 30% to 40% narrowing downstream in the circumflex artery. ?  ? CONCLUSION:  Widely patent stent in the proximal circumflex artery with 80% ? narrowing in a side Caleigha Zale, but with brisk TIMI-3 flow. ?  ? RECOMMENDATIONS:  The stent is widely patent and the side Bayla Mcgovern has brisk ? TIMI flow and the ostial stenosis appears better than the post-procedure ? films.  I think it is very unlikely that the patient's symptoms are related ? to the marginal Teddy Pena narrowing.  His symptoms may be related to his Plavix ? and we will plan to treat him with Pepcid and continued observation. ?  ?  ?08/2003 cath ?COMPLICATIONS:  None. ?  ? FINDINGS: ? 1. LV 114/5/13.  EF approximately 60% without regional wall motion ?    abnormality in an RAO projection. ? 2. No aortic stenosis or mitral regurgitation. ? 3. Left main:  Angiographically normal. ? 4. LAD:  Large vessel wrapping the apex.  There is a 50% stenosis of the ?    proximal vessel. ? 5. Circumflex:  Hazy, approximately 80% stenosis of the proximal vessel ?    involving the takeoff of moderate sized first obtuse marginal.  This ?    first marginal had an 80% stenosis.  The AV groove circumflex was stented ?    to no residual stenosis.  There remained a 70% stenosis in  the obtuse ?    marginal.  TIMI 3 flow was maintained in both the circumflex proper and ?    the first obtuse marginal. ? 6. RCA:  Moderate sized, dominant vessel.  There is a total occlusion of the ?    mid ves

## 2021-04-10 NOTE — Patient Instructions (Addendum)
Medication Instructions:  ?Your physician has recommended you make the following change in your medication:  ?Stop simvastatin ?Start atorvastatin 80 mg daily ?Continue other medications the same ? ?Labwork: ?none ? ?Testing/Procedures: ?none ? ?Follow-Up: ?Your physician recommends that you schedule a follow-up appointment in: 6 months ? ?Any Other Special Instructions Will Be Listed Below (If Applicable). ?Call or send mychart message on Friday with your home blood pressure readings ? ?If you need a refill on your cardiac medications before your next appointment, please call your pharmacy. ?

## 2021-04-14 ENCOUNTER — Encounter: Payer: Self-pay | Admitting: Cardiology

## 2021-04-19 NOTE — Telephone Encounter (Signed)
On average I think bp's are fine and would not make a change. ? ? ?Zandra Abts MD ?

## 2021-05-29 ENCOUNTER — Other Ambulatory Visit: Payer: Self-pay

## 2021-05-29 DIAGNOSIS — I714 Abdominal aortic aneurysm, without rupture, unspecified: Secondary | ICD-10-CM

## 2021-06-05 ENCOUNTER — Emergency Department (HOSPITAL_COMMUNITY): Payer: Medicare HMO

## 2021-06-05 ENCOUNTER — Encounter (HOSPITAL_COMMUNITY): Payer: Self-pay

## 2021-06-05 ENCOUNTER — Observation Stay (HOSPITAL_COMMUNITY)
Admission: EM | Admit: 2021-06-05 | Discharge: 2021-06-07 | Disposition: A | Discharge status: Home / Self Care | Payer: Medicare HMO | Attending: Family Medicine | Admitting: Family Medicine

## 2021-06-05 ENCOUNTER — Other Ambulatory Visit: Payer: Self-pay

## 2021-06-05 DIAGNOSIS — I639 Cerebral infarction, unspecified: Secondary | ICD-10-CM | POA: Diagnosis not present

## 2021-06-05 DIAGNOSIS — I6381 Other cerebral infarction due to occlusion or stenosis of small artery: Secondary | ICD-10-CM | POA: Diagnosis not present

## 2021-06-05 DIAGNOSIS — R42 Dizziness and giddiness: Secondary | ICD-10-CM | POA: Diagnosis not present

## 2021-06-05 DIAGNOSIS — I2510 Atherosclerotic heart disease of native coronary artery without angina pectoris: Secondary | ICD-10-CM | POA: Diagnosis not present

## 2021-06-05 DIAGNOSIS — Z87891 Personal history of nicotine dependence: Secondary | ICD-10-CM | POA: Diagnosis not present

## 2021-06-05 DIAGNOSIS — R41841 Cognitive communication deficit: Secondary | ICD-10-CM | POA: Insufficient documentation

## 2021-06-05 DIAGNOSIS — I714 Abdominal aortic aneurysm, without rupture, unspecified: Secondary | ICD-10-CM | POA: Diagnosis present

## 2021-06-05 DIAGNOSIS — R410 Disorientation, unspecified: Secondary | ICD-10-CM | POA: Diagnosis not present

## 2021-06-05 DIAGNOSIS — I252 Old myocardial infarction: Secondary | ICD-10-CM | POA: Diagnosis not present

## 2021-06-05 DIAGNOSIS — Z8673 Personal history of transient ischemic attack (TIA), and cerebral infarction without residual deficits: Secondary | ICD-10-CM | POA: Diagnosis not present

## 2021-06-05 DIAGNOSIS — R4182 Altered mental status, unspecified: Secondary | ICD-10-CM | POA: Diagnosis present

## 2021-06-05 DIAGNOSIS — J449 Chronic obstructive pulmonary disease, unspecified: Secondary | ICD-10-CM | POA: Insufficient documentation

## 2021-06-05 DIAGNOSIS — Z7982 Long term (current) use of aspirin: Secondary | ICD-10-CM | POA: Insufficient documentation

## 2021-06-05 DIAGNOSIS — I10 Essential (primary) hypertension: Secondary | ICD-10-CM | POA: Insufficient documentation

## 2021-06-05 DIAGNOSIS — Z79899 Other long term (current) drug therapy: Secondary | ICD-10-CM | POA: Diagnosis not present

## 2021-06-05 DIAGNOSIS — E785 Hyperlipidemia, unspecified: Secondary | ICD-10-CM | POA: Diagnosis present

## 2021-06-05 LAB — DIFFERENTIAL
Abs Immature Granulocytes: 0.02 10*3/uL (ref 0.00–0.07)
Basophils Absolute: 0 10*3/uL (ref 0.0–0.1)
Basophils Relative: 0 %
Eosinophils Absolute: 0.3 10*3/uL (ref 0.0–0.5)
Eosinophils Relative: 3 %
Immature Granulocytes: 0 %
Lymphocytes Relative: 18 %
Lymphs Abs: 1.9 10*3/uL (ref 0.7–4.0)
Monocytes Absolute: 0.8 10*3/uL (ref 0.1–1.0)
Monocytes Relative: 8 %
Neutro Abs: 7.1 10*3/uL (ref 1.7–7.7)
Neutrophils Relative %: 71 %

## 2021-06-05 LAB — CBC
HCT: 42 % (ref 39.0–52.0)
Hemoglobin: 13.7 g/dL (ref 13.0–17.0)
MCH: 31.9 pg (ref 26.0–34.0)
MCHC: 32.6 g/dL (ref 30.0–36.0)
MCV: 97.9 fL (ref 80.0–100.0)
Platelets: 180 10*3/uL (ref 150–400)
RBC: 4.29 MIL/uL (ref 4.22–5.81)
RDW: 13.2 % (ref 11.5–15.5)
WBC: 10.1 10*3/uL (ref 4.0–10.5)
nRBC: 0 % (ref 0.0–0.2)

## 2021-06-05 LAB — I-STAT CHEM 8, ED
BUN: 17 mg/dL (ref 8–23)
Calcium, Ion: 1.03 mmol/L — ABNORMAL LOW (ref 1.15–1.40)
Chloride: 106 mmol/L (ref 98–111)
Creatinine, Ser: 1.1 mg/dL (ref 0.61–1.24)
Glucose, Bld: 86 mg/dL (ref 70–99)
HCT: 42 % (ref 39.0–52.0)
Hemoglobin: 14.3 g/dL (ref 13.0–17.0)
Potassium: 3.5 mmol/L (ref 3.5–5.1)
Sodium: 142 mmol/L (ref 135–145)
TCO2: 26 mmol/L (ref 22–32)

## 2021-06-05 LAB — URINALYSIS, ROUTINE W REFLEX MICROSCOPIC
Bilirubin Urine: NEGATIVE
Glucose, UA: NEGATIVE mg/dL
Hgb urine dipstick: NEGATIVE
Ketones, ur: NEGATIVE mg/dL
Leukocytes,Ua: NEGATIVE
Nitrite: NEGATIVE
Protein, ur: NEGATIVE mg/dL
Specific Gravity, Urine: 1.004 — ABNORMAL LOW (ref 1.005–1.030)
pH: 7 (ref 5.0–8.0)

## 2021-06-05 LAB — COMPREHENSIVE METABOLIC PANEL
ALT: 44 U/L (ref 0–44)
AST: 39 U/L (ref 15–41)
Albumin: 3.7 g/dL (ref 3.5–5.0)
Alkaline Phosphatase: 99 U/L (ref 38–126)
Anion gap: 9 (ref 5–15)
BUN: 15 mg/dL (ref 8–23)
CO2: 26 mmol/L (ref 22–32)
Calcium: 8.6 mg/dL — ABNORMAL LOW (ref 8.9–10.3)
Chloride: 106 mmol/L (ref 98–111)
Creatinine, Ser: 1.13 mg/dL (ref 0.61–1.24)
GFR, Estimated: >60 mL/min (ref >60)
Glucose, Bld: 95 mg/dL (ref 70–99)
Potassium: 3.5 mmol/L (ref 3.5–5.1)
Sodium: 141 mmol/L (ref 135–145)
Total Bilirubin: 0.9 mg/dL (ref 0.3–1.2)
Total Protein: 6.8 g/dL (ref 6.5–8.1)

## 2021-06-05 LAB — PROTIME-INR
INR: 1.1 (ref 0.8–1.2)
Prothrombin Time: 13.6 seconds (ref 11.4–15.2)

## 2021-06-05 LAB — RAPID URINE DRUG SCREEN, HOSP PERFORMED
Amphetamines: NOT DETECTED
Barbiturates: NOT DETECTED
Benzodiazepines: NOT DETECTED
Cocaine: NOT DETECTED
Opiates: NOT DETECTED
Tetrahydrocannabinol: NOT DETECTED

## 2021-06-05 LAB — APTT: aPTT: 28 seconds (ref 24–36)

## 2021-06-05 LAB — ETHANOL: Alcohol, Ethyl (B): <10 mg/dL (ref <10)

## 2021-06-05 LAB — TROPONIN I (HIGH SENSITIVITY): Troponin I (High Sensitivity): 9 ng/L (ref <18)

## 2021-06-05 NOTE — ED Provider Notes (Signed)
?[facility] ?Provider Note ? ? ?CSN: [id number] ?Arrival date & time: 06/05/21  1212 ? ?  ? ?History ? ?Chief Complaint  ?Patient presents with  ? Altered Mental Status  ? ? ?Justin Lyons is a 86 y.o. male. ? ? ?Altered Mental Status ?Presenting symptoms: confusion   ?Patient presents for transient confusion.  His medical history includes HLD, gout, HTN, CAD, PVD, COPD, diverticulosis, AAA, and arthritis.  He has a history of a remote coronary stent 25 years ago.  He was previously on aspirin and Plavix.  He is currently on aspirin only.  He believes that he may have been on a blood thinner before but does not know which thinner or why.  Recent history is provided by both patient and son.  Patient was in his normal state of health yesterday afternoon.  He made a drive from [place].  During this drive, he states that he "did not feel right".  He forgot where his turn was.  This caused him to call his son.  At that time, he is also endorsing some dizziness.  This morning, he also states that he had continued dizziness.  He had a difficult time remembering what he had eaten for breakfast.  This is not typical for the patient, who, at baseline, has a very sharp mind.  While in the ED waiting room, patient forgot how to adjust the volume on his cell phone.  Other than that, he states that he no longer feels any dizziness and currently feels like he is at his normal.   ?  ? ?Home Medications ?Prior to Admission medications   ?Medication Sig Start Date End Date Taking? Authorizing Provider  ?albuterol (PROVENTIL) (2.5 MG/3ML) 0.083% nebulizer solution Take 2.5 mg by nebulization every 6 (six) hours as needed for wheezing or shortness of breath. 03/02/19  Yes [provider]  ?albuterol (VENTOLIN HFA) 108 (90 Base) MCG/ACT inhaler Inhale 1-2 puffs into the lungs every 6 (six) hours as needed for wheezing or shortness of breath. 03/02/19  Yes [provider]  ?allopurinol  (ZYLOPRIM) 100 MG tablet Take 100 mg by mouth daily.   Yes [provider]  ?aspirin 81 MG tablet Take 81 mg by mouth daily.   Yes [provider]  ?atorvastatin (LIPITOR) 80 MG tablet Take 1 tablet (80 mg total) by mouth daily. ?Patient taking differently: Take 80 mg by mouth at bedtime. 04/10/21  Yes BranchAlphonse Guild, MD  ?Cholecalciferol (VITAMIN D) 1000 UNITS capsule Take 1,000 Units by mouth daily.   Yes [provider]  ?diclofenac (VOLTAREN) 75 MG EC tablet Take 1 tablet by mouth 2 (two) times daily. 07/26/20  Yes [provider]  ?metoprolol tartrate (LOPRESSOR) 25 MG tablet Take 25 mg by mouth 2 (two) times daily.    Yes [provider]  ?nitroGLYCERIN (NITROSTAT) 0.4 MG SL tablet Place 1 tablet (0.4 mg total) under the tongue every 5 (five) minutes as needed. ?Patient taking differently: Place 0.4 mg under the tongue every 5 (five) minutes as needed for chest pain. 11/28/18  Yes BranchAlphonse Guild, MD  ?pantoprazole (PROTONIX) 40 MG tablet Take 1 tablet (40 mg total) by mouth daily. 03/24/13  Yes Noralee Space, MD  ?tiZANidine (ZANAFLEX) 2 MG tablet Take 2 mg by mouth at bedtime. 04/04/21  Yes [provider]  ?   ? ?Allergies    ?Doxycycline, Penicillins, and Sulfonamide derivatives   ? ?Review of Systems   ?Review  of Systems  ?Neurological:  Positive for dizziness.  ?Psychiatric/Behavioral:  Positive for confusion.   ?All other systems reviewed and are negative. ? ?Physical Exam ?Updated Vital Signs ?BP (!) 142/74   Pulse 79   Temp 98.1 ?F (36.7 ?C) (Oral)   Resp (!) 22   Ht '5\' 10"'$  (1.778 m)   Wt 79.4 kg   SpO2 100%   BMI 25.11 kg/m?  ?Physical Exam ?Vitals and nursing note reviewed.  ?Constitutional:   ?   General: He is not in acute distress. ?   Appearance: Normal appearance. He is well-developed and normal weight. He is not ill-appearing, toxic-appearing or diaphoretic.  ?HENT:  ?   Head: Normocephalic and atraumatic.  ?   Right Ear: External  ear normal.  ?   Left Ear: External ear normal.  ?   Nose: Nose normal.  ?   Mouth/Throat:  ?   Mouth: Mucous membranes are moist.  ?   Pharynx: Oropharynx is clear.  ?Eyes:  ?   General: No visual field deficit or scleral icterus. ?   Extraocular Movements: Extraocular movements intact.  ?   Conjunctiva/sclera: Conjunctivae normal.  ?Cardiovascular:  ?   Rate and Rhythm: Normal rate and regular rhythm.  ?   Heart sounds: No murmur heard. ?Pulmonary:  ?   Effort: Pulmonary effort is normal. No respiratory distress.  ?Abdominal:  ?   General: There is no distension.  ?   Palpations: Abdomen is soft.  ?Musculoskeletal:     ?   General: No swelling. Normal range of motion.  ?   Cervical back: Normal range of motion and neck supple. No rigidity.  ?   Right lower leg: No edema.  ?   Left lower leg: No edema.  ?Skin: ?   General: Skin is warm and dry.  ?   Coloration: Skin is not jaundiced or pale.  ?Neurological:  ?   Mental Status: He is alert.  ?   Cranial Nerves: No cranial nerve deficit, dysarthria or facial asymmetry.  ?   Sensory: Sensation is intact. No sensory deficit.  ?   Motor: Motor function is intact. No weakness, abnormal muscle tone or pronator drift.  ?   Coordination: Romberg sign negative. Finger-Nose-Finger Test normal.  ?Psychiatric:     ?   Mood and Affect: Mood normal.  ? ? ?ED Results / Procedures / Treatments   ?Labs ?(all labs ordered are listed, but only abnormal results are displayed) ?Labs Reviewed  ?COMPREHENSIVE METABOLIC PANEL - Abnormal; Notable for the following components:  ?    Result Value  ? Calcium 8.6 (*)   ? All other components within normal limits  ?URINALYSIS, ROUTINE W REFLEX MICROSCOPIC - Abnormal; Notable for the following components:  ? Color, Urine STRAW (*)   ? Specific Gravity, Urine 1.004 (*)   ? All other components within normal limits  ?I-STAT CHEM 8, ED - Abnormal; Notable for the following components:  ? Calcium, Ion 1.03 (*)   ? All other components within normal  limits  ?ETHANOL  ?PROTIME-INR  ?APTT  ?CBC  ?DIFFERENTIAL  ?RAPID URINE DRUG SCREEN, HOSP PERFORMED  ?LIPID PANEL  ?HEMOGLOBIN A1C  ?TROPONIN I (HIGH SENSITIVITY)  ?TROPONIN I (HIGH SENSITIVITY)  ? ? ?EKG ?EKG Interpretation ? ?Date/Time:  Monday Jun 05 2021 17:10:29 EDT ?Ventricular Rate:  69 ?PR Interval:  207 ?QRS Duration: 88 ?QT Interval:  406 ?QTC Calculation: 435 ?R Axis:   14 ?Text Interpretation: Sinus rhythm Borderline T  abnormalities, inferior leads Confirmed by Godfrey Pick [phone]) on 06/05/2021 8:35:00 PM ? ?Radiology ?CT HEAD WO CONTRAST ? ?Result Date: 06/05/2021 ?CLINICAL DATA:  Delirium.  Confusion began last night.  Dizziness. EXAM: CT HEAD WITHOUT CONTRAST TECHNIQUE: Contiguous axial images were obtained from the base of the skull through the vertex without intravenous contrast. RADIATION DOSE REDUCTION: This exam was performed according to the departmental dose-optimization program which includes automated exposure control, adjustment of the mA and/or kV according to patient size and/or use of iterative reconstruction technique. COMPARISON:  None Available. FINDINGS: Brain: A 7 mm lacunar infarcts of the right lentiform nucleus appears remote. Moderate generalized atrophy and white matter hypoattenuation is evident bilaterally. More subtle focal hypoattenuation is present in the right thalamus. No acute infarct or hemorrhage is present. The ventricles are of normal size. No significant extraaxial fluid collection is present. The brainstem and cerebellum are within normal limits. Vascular: Atherosclerotic calcifications are present within the cavernous internal carotid arteries. No hyperdense vessel is present. Skull: Calvarium is intact. No focal lytic or blastic lesions are present. No significant extracranial soft tissue lesion is present. Sinuses/Orbits: Bilateral maxillary antrostomies and ethmoidectomies noted. Residual polyp or mucous retention cyst is present in the inferior left frontal  sinus. The sinuses otherwise clear. Chronic wall thickening is present in the posterior maxillary sinuses. The mastoid air cells are clear. Bilateral lens replacements are noted. Globes and orbits are otherwise un

## 2021-06-05 NOTE — ED Provider Triage Note (Signed)
Emergency Medicine Provider Triage Evaluation Note ? ?Justin Lyons , a 86 y.o. male  was evaluated in triage.  Pt complains of dizziness, confusion.  Symptoms began approximately 10 PM on 06/04/2021 while he was driving home from [place].  History supplemented by son at the bedside.  States that he missed his turn while he was driving and appeared confused.  Woke up this morning with similar symptoms but less dizziness than what he experienced last night.  Son is also concerned that he still feels slightly confused "he took a long time to tell me what he had for breakfast this morning and that is not like him."  Patient denies any headache, blurry vision, numbness, chest pain, shortness of breath, vomiting.  Has been compliant with his antihypertensive. ? ?Review of Systems  ?Positive: Dizziness, confusion ?Negative: Shortness of breath ? ?Physical Exam  ?BP (!) 190/76 (BP Location: Right Arm)   Pulse 70   Temp 98.1 ?F (36.7 ?C) (Oral)   Resp 16   Ht '5\' 10"'$  (1.778 m)   Wt 79.4 kg   SpO2 94%   BMI 25.11 kg/m?  ?Gen:   Awake, no distress   ?Resp:  Normal effort  ?MSK:   Moves extremities without difficulty  ?Other:  Alert, oriented to self, place, time.  No facial asymmetry.  Strength 5/5 in bilateral upper and lower extremities.  No aphasia ? ?Medical Decision Making  ?Medically screening exam initiated at 1:03 PM.  Appropriate orders placed.  Justin Lyons was informed that the remainder of the evaluation will be completed by another provider, this initial triage assessment does not replace that evaluation, and the importance of remaining in the ED until their evaluation is complete. ? ?No indication for code stroke due to timing but work-up initiated ?  ?Delia Heady, PA-C ?06/05/21 1305 ? ?

## 2021-06-05 NOTE — ED Triage Notes (Signed)
Pt arrived POV from home c/o confusion that started last night. Pt states he had some dizziness last night but that has subsided at the moment, but per the pts son he was out driving and called his son because he got confused and missed his turn. His son checked on him this morning and stated it seems like he was still confused or a little off. Pt is alert and oriented x4 in triage.  ?

## 2021-06-05 NOTE — Consult Note (Signed)
Neurology Consultation ? ?Reason for Consult: Strokelike symptoms ?Referring Physician: Dr. Doren Custard ? ?CC: Confusion, dizziness ? ?History is obtained from: Patient, chart, son ? ?HPI: Justin Lyons is a 86 y.o. 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 ?He said he was driving back home and had difficulty remembering the route which is his usual route.  He also felt dizzy-that she describes as lightheadedness and somewhat imbalance.  Symptoms persisted which made him come to the hospital.  An MRI of the brain was done as a part of the work-up to determine the etiology of his problems and the MRI revealed punctate foci of acute ischemia within the posterior left parietal, left occipital lobe and left cerebellum.  Neurological consultation was obtained after these findings. ?He denies any prior history of strokes.  Denies any headaches or visual symptoms.  Denies any shortness of breath.  Denies any palpitations chest pain or feelings of heart racing or skipped beats. ? ? ?LKW: 10 PM 06/04/2021 ?tpa given?: no, outside the window ?Premorbid modified Rankin scale (mRS): 0-rather independent 86 year old ? ?ROS: Full ROS was performed and is negative except as noted in the HPI.  ?Past Medical History:  ?Diagnosis Date  ? AAA (abdominal aortic aneurysm) ([place])   ? 06/02/2003 old BPG  ? Arthritis   ? Carotid artery occlusion   ? Carotid bruit   ? COPD (chronic obstructive pulmonary disease) ([place])   ? Coronary artery disease   ? Diverticulosis of colon   ? Erectile dysfunction   ? Hemorrhoids, internal   ? History of compression fracture of spine   ? History of gout   ? Hyperlipidemia   ? Hypertension   ? Knee pain   ? Myocardial infarction [facility]) 2005  ? Peripheral vascular disease ([place])    ? ?Family History  ?Problem Relation Age of Onset  ? Heart disease Father   ?     Heart Disease before age 62  ? Heart attack Father   ? Cancer Mother   ?     Colon  ? Heart disease Mother   ? Hyperlipidemia Mother   ? Hypertension Mother   ? Heart attack Mother   ? Colon cancer Mother   ? Cancer Brother   ?     lung cancer  ? ? ? ?Social History:  ? reports that he quit smoking about 39 years ago. His smoking use included cigarettes. He has a 30.00 pack-year smoking history. He has never used smokeless tobacco. He reports that he does not drink alcohol and does not use drugs. ? ?Medications ?No current facility-administered medications for this encounter. ? ?Current Outpatient Medications:  ?  albuterol (PROVENTIL) (2.5 MG/3ML) 0.083% nebulizer solution, Take 2.5 mg by nebulization every 6 (six) hours as needed for wheezing or shortness of breath., Disp: , Rfl:  ?  albuterol (VENTOLIN HFA) 108 (90 Base) MCG/ACT inhaler, Inhale 1-2 puffs into the lungs every 6 (six) hours as needed for wheezing or shortness of breath., Disp: , Rfl:  ?  allopurinol (ZYLOPRIM) 100 MG tablet, Take 100 mg by mouth daily., Disp: , Rfl:  ?  aspirin 81 MG tablet, Take 81 mg by mouth daily., Disp: , Rfl:  ?  atorvastatin (LIPITOR) 80 MG tablet, Take 1 tablet (80 mg total) by mouth daily. (  Patient taking differently: Take 80 mg by mouth at bedtime.), Disp: 90 tablet, Rfl: 3 ?  Cholecalciferol (VITAMIN D) 1000 UNITS capsule, Take 1,000 Units by mouth daily., Disp: , Rfl:  ?  diclofenac (VOLTAREN) 75 MG EC tablet, Take 1 tablet by mouth 2 (two) times daily., Disp: , Rfl:  ?  metoprolol tartrate (LOPRESSOR) 25 MG tablet, Take 25 mg by mouth 2 (two) times daily. , Disp: , Rfl:  ?  nitroGLYCERIN (NITROSTAT) 0.4 MG SL tablet, Place 1 tablet (0.4 mg total) under the tongue every 5 (five) minutes as needed. (Patient taking differently: Place 0.4 mg under the tongue every 5 (five) minutes as needed for chest pain.), Disp: 25 tablet, Rfl: 3 ?   pantoprazole (PROTONIX) 40 MG tablet, Take 1 tablet (40 mg total) by mouth daily., Disp: 90 tablet, Rfl: 3 ?  tiZANidine (ZANAFLEX) 2 MG tablet, Take 2 mg by mouth at bedtime., Disp: , Rfl:  ? ? ?Exam: ?Current vital signs: ?BP (!) 159/71   Pulse 86   Temp 98.1 ?F (36.7 ?C) (Oral)   Resp (!) 22   Ht '5\' 10"'$  (1.778 m)   Wt 79.4 kg   SpO2 94%   BMI 25.11 kg/m?  ?Vital signs in last 24 hours: ?Temp:  [98.1 ?F (36.7 ?C)] 98.1 ?F (36.7 ?C) (05/15 1252) ?Pulse Rate:  [70-86] 86 (05/15 2232) ?Resp:  [16-24] 22 (05/15 2232) ?BP: (136-190)/(55-82) 159/71 (05/15 2232) ?SpO2:  [92 %-97 %] 94 % (05/15 2232) ?Weight:  [79.4 kg] 79.4 kg (05/15 1253) ? ?GENERAL: Awake, alert in NAD ?HEENT: - Normocephalic and atraumatic, dry mm, no LN++, no Thyromegally ?LUNGS - Clear to auscultation bilaterally with no wheezes ?CV - S1S2 RRR, no m/r/g, equal pulses bilaterally. ?ABDOMEN - Soft, nontender, nondistended with normoactive BS ?Ext: warm, well perfused, intact peripheral pulses, no edema ? ?NEURO:  ?Mental Status: AA&Ox3  ?Language: speech is nondysarthric.  Naming, repetition, fluency, and comprehension intact. ?Cranial Nerves: PERRL,EOMI, visual fields full, no facial asymmetry, facial sensation intact, hearing diminished bilaterally, tongue/uvula/soft palate midline, normal sternocleidomastoid and trapezius muscle strength. No evidence of tongue atrophy or fibrillations ?Motor: 5/5 without drift in any fours ?Tone: is normal and bulk is normal ?Sensation- Intact to light touch bilaterally ?Coordination: FTN intact bilaterally, no ataxia in BLE. ?Gait- deferred ? ?NIHSS--0 ? ? ?Labs ?I have reviewed the labs ? ?CBC ?   ?Component Value Date/Time  ? WBC 10.1 06/05/2021 1308  ? RBC 4.29 06/05/2021 1308  ? HGB 14.3 06/05/2021 1430  ? HCT 42.0 06/05/2021 1430  ? PLT 180 06/05/2021 1308  ? MCV 97.9 06/05/2021 1308  ? MCH 31.9 06/05/2021 1308  ? MCHC 32.6 06/05/2021 1308  ? RDW 13.2 06/05/2021 1308  ? LYMPHSABS 1.9 06/05/2021 1308  ?  MONOABS 0.8 06/05/2021 1308  ? EOSABS 0.3 06/05/2021 1308  ? BASOSABS 0.0 06/05/2021 1308  ? ? ?CMP  ?   ?Component Value Date/Time  ? NA 142 06/05/2021 1430  ? K 3.5 06/05/2021 1430  ? CL 106 06/05/2021 1430  ? CO2 26 06/05/2021 1308  ? GLUCOSE 86 06/05/2021 1430  ? BUN 17 06/05/2021 1430  ? CREATININE 1.10 06/05/2021 1430  ? CALCIUM 8.6 (L) 06/05/2021 1308  ? PROT 6.8 06/05/2021 1308  ? ALBUMIN 3.7 06/05/2021 1308  ? AST 39 06/05/2021 1308  ? ALT 44 06/05/2021 1308  ? ALKPHOS 99 06/05/2021 1308  ? BILITOT 0.9 06/05/2021 1308  ? GFRNONAA >60 06/05/2021 1308  ? GFRAA >60 04/08/2017 0253  ? ? ? ?  Imaging ?I have reviewed the images obtained: ? ?MRI brain with punctate areas of acute ischemia in the left parietal, left occipital lobe and left cerebellum. ? ?Assessment:  ?86 year old with above past medical history with sudden onset of confusion as well as dizziness with last known well on 10 PM 06/04/2021 with brain imaging revealing punctate areas of acute ischemia in the left frontal left occipital and left cerebellar hemispheres. ?Concerning for underlying cardioembolic process. ?Will benefit from a full stroke work-up. ? ?Recommendations:  ?Admit to hospitalist ?Frequent neurochecks ?Allow for permissive hypertension-treat only systolic blood pressures are greater than 220 on a as needed basis for the next any 4 to 48 hours. ?Aspirin 81 and Plavix 75 ?High intensity statin ?CT angiography head and neck ?2D echocardiogram ?A1c ?Lipid panel ?PT ?OT ?Speech therapy ?If atrial fibrillation not picked up on cardiac monitoring during hospitalization, will need outpatient cardiac monitoring due to strong suspicion for cardioembolic process unless vessel imaging points to a large vessel etiology. ?Stroke team to follow. ?Plan was discussed with Dr. Doren Custard. ? ?-- ?Amie Portland, MD ?Neurologist ?[facility] ?Pager: [phone] ? ?

## 2021-06-06 ENCOUNTER — Observation Stay (HOSPITAL_BASED_OUTPATIENT_CLINIC_OR_DEPARTMENT_OTHER): Payer: Medicare HMO

## 2021-06-06 ENCOUNTER — Observation Stay (HOSPITAL_COMMUNITY): Payer: Medicare HMO

## 2021-06-06 DIAGNOSIS — I639 Cerebral infarction, unspecified: Secondary | ICD-10-CM | POA: Diagnosis not present

## 2021-06-06 DIAGNOSIS — I63233 Cerebral infarction due to unspecified occlusion or stenosis of bilateral carotid arteries: Secondary | ICD-10-CM | POA: Diagnosis not present

## 2021-06-06 DIAGNOSIS — I771 Stricture of artery: Secondary | ICD-10-CM

## 2021-06-06 DIAGNOSIS — I6389 Other cerebral infarction: Secondary | ICD-10-CM | POA: Diagnosis not present

## 2021-06-06 DIAGNOSIS — E785 Hyperlipidemia, unspecified: Secondary | ICD-10-CM

## 2021-06-06 DIAGNOSIS — I2510 Atherosclerotic heart disease of native coronary artery without angina pectoris: Secondary | ICD-10-CM

## 2021-06-06 DIAGNOSIS — I10 Essential (primary) hypertension: Secondary | ICD-10-CM

## 2021-06-06 LAB — LIPID PANEL
Cholesterol: 98 mg/dL (ref 0–200)
HDL: 28 mg/dL — ABNORMAL LOW (ref >40)
LDL Cholesterol: 57 mg/dL (ref 0–99)
Total CHOL/HDL Ratio: 3.5 RATIO
Triglycerides: 63 mg/dL (ref <150)
VLDL: 13 mg/dL (ref 0–40)

## 2021-06-06 LAB — ECHOCARDIOGRAM COMPLETE
AR max vel: 2.71 cm2
AV Area VTI: 3.15 cm2
AV Area mean vel: 2.75 cm2
AV Mean grad: 3 mmHg
AV Peak grad: 6.2 mmHg
Ao pk vel: 1.24 m/s
Area-P 1/2: 3.53 cm2
Calc EF: 50.9 %
Height: 70 in
MV VTI: 2.88 cm2
S' Lateral: 2.3 cm
Single Plane A2C EF: 50 %
Single Plane A4C EF: 49.8 %
Weight: 2800 oz

## 2021-06-06 LAB — TROPONIN I (HIGH SENSITIVITY): Troponin I (High Sensitivity): 9 ng/L (ref <18)

## 2021-06-06 LAB — HEMOGLOBIN A1C
Hgb A1c MFr Bld: 5.8 % — ABNORMAL HIGH (ref 4.8–5.6)
Mean Plasma Glucose: 119.76 mg/dL

## 2021-06-06 MED ORDER — TIZANIDINE HCL 4 MG PO TABS
2.0000 mg | ORAL_TABLET | Freq: Every day | ORAL | Status: DC
  Administered 2021-06-06 (×2): 2 mg via ORAL
  Filled 2021-06-06 (×2): qty 1

## 2021-06-06 MED ORDER — PANTOPRAZOLE SODIUM 40 MG PO TBEC
40.0000 mg | DELAYED_RELEASE_TABLET | Freq: Every day | ORAL | Status: DC
  Administered 2021-06-06 – 2021-06-07 (×2): 40 mg via ORAL
  Filled 2021-06-06 (×2): qty 1

## 2021-06-06 MED ORDER — STROKE: EARLY STAGES OF RECOVERY BOOK
Freq: Once | Status: AC
  Filled 2021-06-06: qty 1

## 2021-06-06 MED ORDER — ACETAMINOPHEN 650 MG RE SUPP
650.0000 mg | RECTAL | Status: DC | PRN
Start: 2021-06-06 — End: 2021-06-07

## 2021-06-06 MED ORDER — IOHEXOL 350 MG/ML SOLN
75.0000 mL | Freq: Once | INTRAVENOUS | Status: AC | PRN
  Administered 2021-06-06: 75 mL via INTRAVENOUS

## 2021-06-06 MED ORDER — CLOPIDOGREL BISULFATE 75 MG PO TABS
75.0000 mg | ORAL_TABLET | Freq: Every day | ORAL | Status: DC
  Administered 2021-06-06 – 2021-06-07 (×2): 75 mg via ORAL
  Filled 2021-06-06 (×2): qty 1

## 2021-06-06 MED ORDER — ALBUTEROL SULFATE (2.5 MG/3ML) 0.083% IN NEBU: 2.5000 mg | INHALATION_SOLUTION | Freq: Four times a day (QID) | RESPIRATORY_TRACT | Status: DC | PRN

## 2021-06-06 MED ORDER — ATORVASTATIN CALCIUM 80 MG PO TABS
80.0000 mg | ORAL_TABLET | Freq: Every day | ORAL | Status: DC
  Administered 2021-06-06 (×2): 80 mg via ORAL
  Filled 2021-06-06 (×2): qty 1

## 2021-06-06 MED ORDER — ASPIRIN 81 MG PO CHEW
81.0000 mg | CHEWABLE_TABLET | Freq: Every day | ORAL | Status: DC
  Administered 2021-06-06 – 2021-06-07 (×2): 81 mg via ORAL
  Filled 2021-06-06 (×2): qty 1

## 2021-06-06 MED ORDER — ACETAMINOPHEN 325 MG PO TABS
650.0000 mg | ORAL_TABLET | ORAL | Status: DC | PRN
Start: 2021-06-06 — End: 2021-06-07

## 2021-06-06 MED ORDER — DICLOFENAC SODIUM 75 MG PO TBEC
75.0000 mg | DELAYED_RELEASE_TABLET | Freq: Two times a day (BID) | ORAL | Status: DC
  Administered 2021-06-06 – 2021-06-07 (×3): 75 mg via ORAL
  Filled 2021-06-06 (×5): qty 1

## 2021-06-06 MED ORDER — ALLOPURINOL 100 MG PO TABS
100.0000 mg | ORAL_TABLET | Freq: Every day | ORAL | Status: DC
  Administered 2021-06-06 – 2021-06-07 (×2): 100 mg via ORAL
  Filled 2021-06-06 (×2): qty 1

## 2021-06-06 MED ORDER — ENOXAPARIN SODIUM 40 MG/0.4ML IJ SOSY
40.0000 mg | PREFILLED_SYRINGE | INTRAMUSCULAR | Status: DC
  Administered 2021-06-06 – 2021-06-07 (×2): 40 mg via SUBCUTANEOUS
  Filled 2021-06-06 (×2): qty 0.4

## 2021-06-06 MED ORDER — ACETAMINOPHEN 160 MG/5ML PO SOLN
650.0000 mg | ORAL | Status: DC | PRN
Start: 2021-06-06 — End: 2021-06-07

## 2021-06-06 NOTE — H&P (Signed)
?History and Physical  ? ? ?Patient: Justin Lyons WUJ:[id number] DOB: 13-Aug-1935 ?DOA: 06/05/2021 ?DOS: the patient was seen and examined on 06/06/2021 ?PCP: Manon Hilding, MD  ?Patient coming from: Home ? ?Chief Complaint:  ?Chief Complaint  ?Patient presents with  ? Altered Mental Status  ? ?HPI: Justin Lyons is a 86 y.o. male with medical history significant of AAA s/p EVAR, HTN, CAD. ? ?Pt presents to ED after apparent onset of confusion and dizziness, LKW sometime around 10pm on 5/14 ? ?Pt was driving back home, had trouble remembering the route.  Felt dizzy.  Symptoms persisted and pt in to hospital. ? ? ?Review of Systems: As mentioned in the history of present illness. All other systems reviewed and are negative. ?Past Medical History:  ?Diagnosis Date  ? AAA (abdominal aortic aneurysm) ([place])   ? 06/02/2003 old BPG  ? Arthritis   ? Carotid artery occlusion   ? Carotid bruit   ? COPD (chronic obstructive pulmonary disease) ([place])   ? Coronary artery disease   ? Diverticulosis of colon   ? Erectile dysfunction   ? Hemorrhoids, internal   ? History of compression fracture of spine   ? History of gout   ? Hyperlipidemia   ? Hypertension   ? Knee pain   ? Myocardial infarction [facility]) 2005  ? Peripheral vascular disease ([place])   ? ?Past Surgical History:  ?Procedure Laterality Date  ? ABDOMINAL AORTIC ENDOVASCULAR STENT GRAFT N/A 04/07/2017  ? Procedure: ENDOVASCULAR ABDOMINAL ANEURYSM REPAIR;  Surgeon: Serafina Mitchell, MD;  Location: [place];  Service: Vascular;  Laterality: N/A;  ? CORONARY ANGIOPLASTY  2005  ? ?Social History:  reports that he quit smoking about 39 years ago. His smoking use included cigarettes. He has a 30.00 pack-year smoking history. He has never used smokeless tobacco. He reports that he does not drink alcohol and does not use drugs. ? ?Allergies  ?Allergen Reactions  ? Doxycycline Rash  ? Penicillins Rash  ? Sulfonamide Derivatives Rash  ? ? ?Family History  ?Problem Relation Age of Onset  ?  Heart disease Father   ?     Heart Disease before age 55  ? Heart attack Father   ? Cancer Mother   ?     Colon  ? Heart disease Mother   ? Hyperlipidemia Mother   ? Hypertension Mother   ? Heart attack Mother   ? Colon cancer Mother   ? Cancer Brother   ?     lung cancer  ? ? ?Prior to Admission medications   ?Medication Sig Start Date End Date Taking? Authorizing Provider  ?albuterol (PROVENTIL) (2.5 MG/3ML) 0.083% nebulizer solution Take 2.5 mg by nebulization every 6 (six) hours as needed for wheezing or shortness of breath. 03/02/19  Yes [provider]  ?albuterol (VENTOLIN HFA) 108 (90 Base) MCG/ACT inhaler Inhale 1-2 puffs into the lungs every 6 (six) hours as needed for wheezing or shortness of breath. 03/02/19  Yes [provider]  ?allopurinol (ZYLOPRIM) 100 MG tablet Take 100 mg by mouth daily.   Yes [provider]  ?aspirin 81 MG tablet Take 81 mg by mouth daily.   Yes [provider]  ?atorvastatin (LIPITOR) 80 MG tablet Take 1 tablet (80 mg total) by mouth daily. ?Patient taking differently: Take 80 mg by mouth at bedtime. 04/10/21  Yes BranchAlphonse Guild, MD  ?Cholecalciferol (VITAMIN D) 1000 UNITS capsule Take 1,000 Units by mouth daily.   Yes [provider]  ?diclofenac (VOLTAREN) 75 MG EC tablet Take 1 tablet by mouth 2 (two) times daily. 07/26/20  Yes [provider]  ?metoprolol tartrate (LOPRESSOR) 25 MG tablet Take 25 mg by mouth 2 (two) times daily.    Yes [provider]  ?nitroGLYCERIN (NITROSTAT) 0.4 MG SL tablet Place 1 tablet (0.4 mg total) under the tongue every 5 (five) minutes as needed. ?Patient taking differently: Place 0.4 mg under the tongue every 5 (five) minutes as needed for chest pain. 11/28/18  Yes BranchAlphonse Guild, MD  ?pantoprazole (PROTONIX) 40 MG tablet Take 1 tablet (40 mg total) by mouth daily. 03/24/13  Yes Noralee Space, MD  ?tiZANidine (ZANAFLEX) 2 MG tablet Take 2 mg by mouth at bedtime. 04/04/21  Yes  [provider]  ? ? ?Physical Exam: ?Vitals:  ? 06/05/21 2000 06/05/21 2058 06/05/21 2232 06/05/21 2330  ?BP: (!) 149/55  (!) 159/71 (!) 142/74  ?Pulse: 77 86 86 79  ?Resp: (!) 22 (!) 24 (!) 22   ?Temp:      ?TempSrc:      ?SpO2: 92% 94% 94% 100%  ?Weight:      ?Height:      ? ?Constitutional: NAD, calm, comfortable ?Eyes: PERRL, lids and conjunctivae normal ?ENMT: Mucous membranes are moist. Posterior pharynx clear of any exudate or lesions.Normal dentition.  ?Neck: normal, supple, no masses, no thyromegaly ?Respiratory: clear to auscultation bilaterally, no wheezing, no crackles. Normal respiratory effort. No accessory muscle use.  ?Cardiovascular: Regular rate and rhythm, no murmurs / rubs / gallops. No extremity edema. 2+ pedal pulses. No carotid bruits.  ?Abdomen: no tenderness, no masses palpated. No hepatosplenomegaly. Bowel sounds positive.  ?Musculoskeletal: no clubbing / cyanosis. No joint deformity upper and lower extremities. Good ROM, no contractures. Normal muscle tone.  ?Skin: no rashes, lesions, ulcers. No induration ?Neurologic: CN 2-12 grossly intact. Sensation intact, DTR normal. Strength 5/5 in all 4.  Not really detecting much of a focal deficit on exam despite the stroke findings on MRI. ?Psychiatric: Normal judgment and insight. Alert and oriented x 3. Normal mood.  ? ?Data Reviewed: ? ?  ? ?MRI brain demonstrates multifocal punctate infarcts in L frontal, L occipital, and L cerebellar hemisphere. ? ?Assessment and Plan: ?* Acute embolic stroke ([place]) ?Multifocal small infarcts.  Neuro is suspicious that pt may have underlying undiagnosed PAF. ?Stroke pathway ?Tele monitor ?2d echo ?CTA head and neck ?A1C, FLP ?PT/OT/SLP ?Neuro consult ? ?Coronary atherosclerosis ?Cont ASA, statin ? ?Hypertension ?Hold home BP meds and allow permissive HTN. ? ?Hyperlipidemia ?Cont lipitor 80. ?Check FLP ? ? ? ? ? Advance Care Planning:   Code Status: Full Code ? ?Consults: Neuro ? ?Family  Communication: No family in room ? ?Severity of Illness: ?The appropriate patient status for this patient is OBSERVATION. Observation status is judged to be reasonable and necessary in order to provide the required intensity of service to ensure the patient's safety. The patient's presenting symptoms, physical exam findings, and initial radiographic and laboratory data in the context of their medical condition is felt to place them at decreased risk for further clinical deterioration. Furthermore, it is anticipated that the patient will be medically stable for discharge from the hospital within 2 midnights of admission.  ? ?Author: ?Etta Quill., DO ?06/06/2021 1:23 AM ? ?For on call review [email or web address].  ?

## 2021-06-06 NOTE — Progress Notes (Signed)
Patient seen and examined.  Still emergency room waiting for inpatient bed.  Admitted early morning hours by nighttime hospitalist.  Very pleasant 86 year old hard of hearing with history of AAA status post EVAR, hypertension and coronary artery disease was driving back home and had trouble remembering the route and felt dizzy.  Called EMS and came to ER.  No focal deficit, confusion cleared.  MRI with multifocal stroke. ? ?Stroke work-up in progress.  Remains fairly stable.  May need loop recorder.  Followed by neurology.  Monitor today.  If able to complete the studies, anticipate discharge later today or tomorrow morning with outpatient follow-up. ? ?Clinical findings, confusion and dizziness resolved now. ?CT head findings, no acute findings.  7 mm lacunar infarct that looks chronic.  Chronic right thalamic stroke. ?MRI of the brain, punctate foci of acute ischemia within the posterior left parietal lobe, left occipital lobe and left cerebellum.  No hemorrhage. ?CT angiogram head and neck, no emergent large vessel occlusion or stenosis. ?2D echocardiogram, pending. ?Antiplatelet therapy, on aspirin at home.  Currently on aspirin and Plavix. ?LDL 57.  Already on statin at home. ?Hemoglobin A1c, 5.8.  No indication for treatment.  Diet modification. ?Therapy recommendations, home.  No ongoing need. ?Neurology following, will send for outpatient follow-up. ?Resume blood pressure medications, no indication for permissive hypertension due to no evidence of large vessel occlusion. ?Patient will need event monitoring, cardiology to coordinate. ? ? ?Total time spent: 25 minutes.  Same-day visit.  No charge visit. ? ? ?

## 2021-06-06 NOTE — Assessment & Plan Note (Signed)
Multifocal small infarcts.  Neuro is suspicious that pt may have underlying undiagnosed PAF. ?1. Stroke pathway ?2. Tele monitor ?3. 2d echo ?4. CTA head and neck ?5. A1C, FLP ?6. PT/OT/SLP ?7. Neuro consult ?

## 2021-06-06 NOTE — Assessment & Plan Note (Signed)
Cont ASA, statin ?

## 2021-06-06 NOTE — ED Notes (Signed)
Teletransport entered.  ?

## 2021-06-06 NOTE — Evaluation (Signed)
Physical Therapy Evaluation & Discharge ?Patient Details ?Name: Justin Lyons ?MRN: [id number] ?DOB: 08-07-1935 ?Today's Date: 06/06/2021 ? ?History of Present Illness ? Pt is an 86 y.o. male admitted 06/05/21 with apparent onset of confusion and dizziness; LKW time around 10pm on 5/14. Brain MRI revealed acute ischemic punctate foci within posterior left parietal, left occipital lobe, left cerebellum. PMH includes COPD, HTN, HLD, PVD, MI, AAA, arthritis. ?  ?Clinical Impression ? Patient evaluated by Physical Therapy with no further acute PT needs identified. 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 Today, pt independent with mobility and ADL tasks; pt does not demonstrate significant instability with higher level balance tasks. Pt reports symptoms feeling back to baseline; reviewed signs/symptoms of stroke (BE FAST). All education has been completed and the patient has no further questions. Acute PT is signing off. Thank you for this referral. ?   ?SpO2 92-98% on RA ?HR 99-118 with ambulation ?Seated BP 174/77 ?Post-ambulation BP 172/74  ? ?Recommendations for follow up therapy are one component of a multi-disciplinary discharge planning process, led by the attending physician.  Recommendations may be updated based on patient status, additional functional criteria and insurance authorization. ? ?Follow Up Recommendations No PT follow up ? ?  ?Assistance Recommended at Discharge PRN  ?Patient can return home with the following ?   ? ?  ?Equipment Recommendations None recommended by PT  ?Recommendations for Other Services ?    ?  ?Functional Status Assessment    ? ?  ?Precautions / Restrictions Precautions ?Precautions: None ?Restrictions ?Weight Bearing Restrictions: No  ? ?  ? ?Mobility ? Bed Mobility ?  ?  ?  ?  ?  ?  ?  ?General bed mobility comments: received sitting in chair ?  ? ?Transfers ?Overall transfer level: Independent ?Equipment used: None ?  ?  ?  ?  ?  ?   ?  ?General transfer comment: indep to stand from chair (without armrests) and toilet ?  ? ?Ambulation/Gait ?Ambulation/Gait assistance: Independent ?Gait Distance (Feet): 300 Feet ?Assistive device: None ?Gait Pattern/deviations: WFL(Within Functional Limits) ?  ?  ?  ?  ? ?Stairs ?  ?  ?  ?  ?  ? ?Wheelchair Mobility ?  ? ?Modified Rankin (Stroke Patients Only) ?Modified Rankin (Stroke Patients Only) ?Pre-Morbid Rankin Score: No symptoms ?Modified Rankin: No symptoms ? ?  ? ?Balance Overall balance assessment: Independent ?  ?  ?  ?  ?  ?  ?  ?  ?  ?  ?  ?  ?  ?  ?  ?Standardized Balance Assessment ?Standardized Balance Assessment : Dynamic Gait Index ?  ?Dynamic Gait Index ?Level Surface: Normal ?Change in Gait Speed: Mild Impairment ?Gait with Horizontal Head Turns: Normal ?Gait with Vertical Head Turns: Normal ?Gait and Pivot Turn: Normal ?Step Over Obstacle: Mild Impairment ?Step Around Obstacles: Normal ?Steps: Mild Impairment ?Total Score: 21 ?   ? ? ? ?Pertinent Vitals/Pain Pain Assessment ?Pain Assessment: No/denies pain  ? ? ?Home Living Family/patient expects to be discharged to:: Private residence ?Living Arrangements: Alone ?Available Help at Discharge: Family;Available PRN/intermittently ?Type of Home: House ?Home Access: Stairs to enter ?Entrance Stairs-Rails: Right ?Entrance Stairs-Number of Steps: 4-5 ?  ?Home Layout: Two level;Able to live on main level with bedroom/bathroom ?Home Equipment: None ?   ?  ?Prior Function Prior Level of Function : Independent/Modified Independent;Driving ?  ?  ?  ?  ?  ?  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  ? ? ?Hand Dominance  ?   ? ?  ?Extremity/Trunk Assessment  ? Upper Extremity Assessment ?Upper Extremity Assessment: Overall WFL for tasks assessed ?  ? ?Lower Extremity Assessment ?Lower Extremity Assessment: Overall WFL for tasks assessed ?  ? ?   ?Communication  ?  Communication: HOH  ?Cognition Arousal/Alertness: Awake/alert ?Behavior During Therapy: [facility] for tasks assessed/performed ?Overall Cognitive Status: Within Functional Limits for tasks assessed ?  ?  ?  ?  ?  ?  ?  ?  ?  ?  ?  ?  ?  ?  ?  ?  ?  ?  ?  ? ?  ?General Comments General comments (skin integrity, edema, etc.): SpO2 92-98% on RA, seated BP 174/77, HR 99-118 with ambulation ? ?  ?Exercises    ? ?Assessment/Plan  ?  ?PT Assessment Patient does not need any further PT services  ?PT Problem List   ? ?   ?  ?PT Treatment Interventions     ? ?PT Goals (Current goals can be found in the Care Plan section)  ?Acute Rehab PT Goals ?PT Goal Formulation: All assessment and education complete, DC therapy ? ?  ?Frequency   ?  ? ? ?Co-evaluation   ?  ?  ?  ?  ? ? ?  ?AM-PAC PT "6 Clicks" Mobility  ?Outcome Measure Help needed turning from your back to your side while in a flat bed without using bedrails?: None ?Help needed moving from lying on your back to sitting on the side of a flat bed without using bedrails?: None ?Help needed moving to and from a bed to a chair (including a wheelchair)?: None ?Help needed standing up from a chair using your arms (e.g., wheelchair or bedside chair)?: None ?Help needed to walk in hospital room?: None ?Help needed climbing 3-5 steps with a railing? : None ?6 Click Score: 24 ? ?  ?End of Session   ?Activity Tolerance: Patient tolerated treatment well ?Patient left: in chair;with call bell/phone within reach ?Nurse Communication: Mobility status ?PT Visit Diagnosis: Other abnormalities of gait and mobility (R26.89) ?  ? ?Time: 6237-6283 ?PT Time Calculation (min) (ACUTE ONLY): 17 min ? ? ?Charges:   PT Evaluation ?$PT Eval Low Complexity: 1 Low ?  ?  ?   ?Mabeline Caras, PT, DPT ?Acute Rehabilitation Services  ?Pager [phone] ?Office [phone] ? ?[name] ?06/06/2021, 8:30 AM ? ?

## 2021-06-06 NOTE — Plan of Care (Signed)
?  Problem: Education: ?Goal: Knowledge of disease or condition will improve ?Outcome: Not Progressing ?Goal: Knowledge of secondary prevention will improve (SELECT ALL) ?Outcome: Not Progressing ?Goal: Knowledge of patient specific risk factors will improve (INDIVIDUALIZE FOR PATIENT) ?Outcome: Not Progressing ?Goal: Individualized Educational Video(s) ?Outcome: Not Progressing ?  ?Problem: Self-Care: ?Goal: Ability to participate in self-care as condition permits will improve ?Outcome: Not Progressing ?Goal: Verbalization of feelings and concerns over difficulty with self-care will improve ?Outcome: Not Progressing ?Goal: Ability to communicate needs accurately will improve ?Outcome: Not Progressing ?  ?Problem: Education: ?Goal: Knowledge of General Education information will improve ?Description: Including pain rating scale, medication(s)/side effects and non-pharmacologic comfort measures ?Outcome: Not Progressing ?  ?Problem: Health Behavior/Discharge Planning: ?Goal: Ability to manage health-related needs will improve ?Outcome: Not Progressing ?  ?Problem: Clinical Measurements: ?Goal: Ability to maintain clinical measurements within normal limits will improve ?Outcome: Not Progressing ?Goal: Will remain free from infection ?Outcome: Not Progressing ?Goal: Diagnostic test results will improve ?Outcome: Not Progressing ?Goal: Respiratory complications will improve ?Outcome: Not Progressing ?Goal: Cardiovascular complication will be avoided ?Outcome: Not Progressing ?  ?Problem: Activity: ?Goal: Risk for activity intolerance will decrease ?Outcome: Not Progressing ?  ?Problem: Nutrition: ?Goal: Adequate nutrition will be maintained ?Outcome: Not Progressing ?  ?Problem: Coping: ?Goal: Level of anxiety will decrease ?Outcome: Not Progressing ?  ?Problem: Elimination: ?Goal: Will not experience complications related to bowel motility ?Outcome: Not Progressing ?Goal: Will not experience complications related to  urinary retention ?Outcome: Not Progressing ?  ?Problem: Pain Managment: ?Goal: General experience of comfort will improve ?Outcome: Not Progressing ?  ?Problem: Safety: ?Goal: Ability to remain free from injury will improve ?Outcome: Not Progressing ?  ?Problem: Skin Integrity: ?Goal: Risk for impaired skin integrity will decrease ?Outcome: Not Progressing ?  ?

## 2021-06-06 NOTE — Progress Notes (Addendum)
STROKE TEAM PROGRESS NOTE  ? ?INTERVAL HISTORY ?Patient is a retired Engineer, structural.  He was driving home last night and had difficulty remembering his usual route.  He also noted dizziness, lightheadedness, imbalance.  The symptoms persisted which is what made him come to the hospital. He states that he feels back to his baseline. Consider loop recorder. No focal neurological deficits on exam. ? ?Vitals:  ? 06/06/21 0545 06/06/21 0600 06/06/21 0630 06/06/21 0645  ?BP: (!) 141/68 135/79 (!) 144/71 (!) 152/67  ?Pulse: 77 75 74 72  ?Resp: '17 11 16 18  '$ ?Temp:      ?TempSrc:      ?SpO2: 95% 98% 97% 96%  ?Weight:      ?Height:      ? ?CBC:  ?Recent Labs  ?Lab 06/05/21 ?1308 06/05/21 ?1430  ?WBC 10.1  --   ?NEUTROABS 7.1  --   ?HGB 13.7 14.3  ?HCT 42.0 42.0  ?MCV 97.9  --   ?PLT 180  --   ? ?Basic Metabolic Panel:  ?Recent Labs  ?Lab 06/05/21 ?1308 06/05/21 ?1430  ?NA 141 142  ?K 3.5 3.5  ?CL 106 106  ?CO2 26  --   ?GLUCOSE 95 86  ?BUN 15 17  ?CREATININE 1.13 1.10  ?CALCIUM 8.6*  --   ? ?Lipid Panel:  ?Recent Labs  ?Lab 06/06/21 ?0333  ?CHOL 98  ?TRIG 63  ?HDL 28*  ?CHOLHDL 3.5  ?VLDL 13  ?[place] 57  ? ?HgbA1c:  ?Recent Labs  ?Lab 06/06/21 ?0333  ?HGBA1C 5.8*  ? ?Urine Drug Screen:  ?Recent Labs  ?Lab 06/05/21 ?1303  ?LABOPIA NONE DETECTED  ?COCAINSCRNUR NONE DETECTED  ?LABBENZ NONE DETECTED  ?AMPHETMU NONE DETECTED  ?THCU NONE DETECTED  ?LABBARB NONE DETECTED  ?  ?Alcohol Level  ?Recent Labs  ?Lab 06/05/21 ?1308  ?ETH <10  ? ? ?IMAGING past 24 hours ?CT ANGIO HEAD NECK W WO CM ? ?Result Date: 06/06/2021 ?CLINICAL DATA:  Stroke follow-up EXAM: CT ANGIOGRAPHY HEAD AND NECK TECHNIQUE: Multidetector CT imaging of the head and neck was performed using the standard protocol during bolus administration of intravenous contrast. Multiplanar CT image reconstructions and MIPs were obtained to evaluate the vascular anatomy. Carotid stenosis measurements (when applicable) are obtained utilizing NASCET criteria, using the distal  internal carotid diameter as the denominator. RADIATION DOSE REDUCTION: This exam was performed according to the departmental dose-optimization program which includes automated exposure control, adjustment of the mA and/or kV according to patient size and/or use of iterative reconstruction technique. CONTRAST:  26m OMNIPAQUE IOHEXOL 350 MG/ML SOLN COMPARISON:  None Available. FINDINGS: CT HEAD FINDINGS Brain: There is no mass, hemorrhage or extra-axial collection. There is generalized atrophy without lobar predilection. There is hypoattenuation of the periventricular white matter, most commonly indicating chronic ischemic microangiopathy. Skull: The visualized skull base, calvarium and extracranial soft tissues are normal. Sinuses/Orbits: No fluid levels or advanced mucosal thickening of the visualized paranasal sinuses. No mastoid or middle ear effusion. The orbits are normal. CTA NECK FINDINGS SKELETON: There is no bony spinal canal stenosis. No lytic or blastic lesion. OTHER NECK: Normal pharynx, larynx and major salivary glands. No cervical lymphadenopathy. Unremarkable thyroid gland. UPPER CHEST: Biapical emphysema. AORTIC ARCH: There is calcific atherosclerosis of the aortic arch. There is no aneurysm, dissection or hemodynamically significant stenosis of the visualized portion of the aorta. Conventional 3 vessel aortic branching pattern. The visualized proximal subclavian arteries are widely patent. RIGHT CAROTID SYSTEM: No dissection, occlusion or aneurysm. Mild atherosclerotic calcification  at the carotid bifurcation without hemodynamically significant stenosis. LEFT CAROTID SYSTEM: No dissection, occlusion or aneurysm. Mild atherosclerotic calcification at the carotid bifurcation without hemodynamically significant stenosis. VERTEBRAL ARTERIES: Left dominant configuration. Both origins are clearly patent. There is no dissection, occlusion or flow-limiting stenosis to the skull base (V1-V3 segments). CTA  HEAD FINDINGS POSTERIOR CIRCULATION: --Vertebral arteries: Normal V4 segments. --Inferior cerebellar arteries: Normal. --Basilar artery: Normal. --Superior cerebellar arteries: Normal. --Posterior cerebral arteries (PCA): Normal. ANTERIOR CIRCULATION: --Intracranial internal carotid arteries: Normal. --Anterior cerebral arteries (ACA): Normal. Both A1 segments are present. Patent anterior communicating artery (a-comm). --Middle cerebral arteries (MCA): Normal. VENOUS SINUSES: As permitted by contrast timing, patent. ANATOMIC VARIANTS: None Review of the MIP images confirms the above findings. IMPRESSION: No emergent large vessel occlusion or hemodynamically significant stenosis of the head or neck. Aortic Atherosclerosis (ICD10-I70.0) and Emphysema (ICD10-J43.9). Electronically Signed   By: Ulyses Jarred M.D.   On: 06/06/2021 03:09  ? ?CT HEAD WO CONTRAST ? ?Result Date: 06/05/2021 ?CLINICAL DATA:  Delirium.  Confusion began last night.  Dizziness. EXAM: CT HEAD WITHOUT CONTRAST TECHNIQUE: Contiguous axial images were obtained from the base of the skull through the vertex without intravenous contrast. RADIATION DOSE REDUCTION: This exam was performed according to the departmental dose-optimization program which includes automated exposure control, adjustment of the mA and/or kV according to patient size and/or use of iterative reconstruction technique. COMPARISON:  None Available. FINDINGS: Brain: A 7 mm lacunar infarcts of the right lentiform nucleus appears remote. Moderate generalized atrophy and white matter hypoattenuation is evident bilaterally. More subtle focal hypoattenuation is present in the right thalamus. No acute infarct or hemorrhage is present. The ventricles are of normal size. No significant extraaxial fluid collection is present. The brainstem and cerebellum are within normal limits. Vascular: Atherosclerotic calcifications are present within the cavernous internal carotid arteries. No hyperdense  vessel is present. Skull: Calvarium is intact. No focal lytic or blastic lesions are present. No significant extracranial soft tissue lesion is present. Sinuses/Orbits: Bilateral maxillary antrostomies and ethmoidectomies noted. Residual polyp or mucous retention cyst is present in the inferior left frontal sinus. The sinuses otherwise clear. Chronic wall thickening is present in the posterior maxillary sinuses. The mastoid air cells are clear. Bilateral lens replacements are noted. Globes and orbits are otherwise unremarkable. IMPRESSION: 1. No acute intracranial abnormality. 2. 7 mm lacunar infarcts of the right lentiform nucleus appears remote. 3. More subtle focal hypoattenuation in the right thalamus appears remote. This could be more acute. Correlate with any left-sided symptoms. 4. Generalized atrophy and white matter disease is mildly advanced for age. This likely reflects the sequela of chronic microvascular ischemia. Electronically Signed   By: San Morelle M.D.   On: 06/05/2021 14:11  ? ?MR BRAIN WO CONTRAST ? ?Result Date: 06/05/2021 ?CLINICAL DATA:  Transient ischemic attack EXAM: MRI HEAD WITHOUT CONTRAST TECHNIQUE: Multiplanar, multiecho pulse sequences of the brain and surrounding structures were obtained without intravenous contrast. COMPARISON:  Head CT 06/05/2021 FINDINGS: Susceptibility artifacts obscuring much of the right hemisphere on multiple sequences. Brain: There are punctate foci of acute ischemia within the posterior left parietal lobe, left occipital lobe and left cerebellum. No acute hemorrhage. Normal white matter signal, parenchymal volume and CSF spaces. The midline structures are normal. Vascular: Major flow voids are preserved. Skull and upper cervical spine: Normal calvarium and skull base. Visualized upper cervical spine and soft tissues are normal. Sinuses/Orbits:No paranasal sinus fluid levels or advanced mucosal thickening. No mastoid or middle ear effusion. Normal  orbits. IMPRESSION: Punctate foci of acute ischemia within the posterior left parietal lobe, left occipital lobe and left cerebellum. No hemorrhage or mass effect. Electronically Signed   By: Cletus Gash.D.

## 2021-06-06 NOTE — Assessment & Plan Note (Signed)
Cont lipitor 80. ?Check FLP ?

## 2021-06-06 NOTE — ED Notes (Signed)
Patient returned to room from CT. 

## 2021-06-06 NOTE — Assessment & Plan Note (Signed)
Hold home BP meds and allow permissive HTN. ?

## 2021-06-06 NOTE — ED Notes (Signed)
Patient transported to CT 

## 2021-06-06 NOTE — Evaluation (Signed)
Occupational Therapy Evaluation ?Patient Details ?Name: Justin Lyons ?MRN: [id number] ?DOB: 13-Jun-1935 ?Today's Date: 06/06/2021 ? ? ?History of Present Illness 86 y.o. male present to the ED on 06/05/21 with apparent onset of confusion and dizziness; LKW time around 10pm on 5/14. Brain MRI revealed acute ischemic punctate foci within posterior left parietal, left occipital lobe, left cerebellum. PMH includes COPD, HTN, HLD, PVD, MI, AAA, arthritis.  ? ?Clinical Impression ?  ?PTA, pt was living alone and was independent with ADLs, IADLs, and working. Pt currently performing ADLs independent and at baseline. Pt presenting with decreased problem solving during IADLs and executive functioning.  ? ?Administered the Pill Box test to assess cognition within medication management.  ?Pt failed the assessment, demonstrating poor problem solving, planning, mental flexibility, and the inability to multitask. Pt had a total of _21_errors, where more than 3 errors is considered a fail.  ?Errors: ?      One tablet 3x/day (yellow) - 14 errors (misplacement) ?      One tablet 2x/day with breakfast and dinner (green) -  7 errors (misplacement) ? ?Number of total errors (sum of omissions; misplacements) - _21_ ?Total time to complete task (allowed 5 min) - _12_ mins  ? ?Discussed safe management of IADLs with patient and daughter and both verbalized understanding. Recommend dc to home once medically stable per physician. Will continue to follow acutely as admitted to continue to monitor cognition and facilitate safe dc.  ? ? ?   ? ?Recommendations for follow up therapy are one component of a multi-disciplinary discharge planning process, led by the attending physician.  Recommendations may be updated based on patient status, additional functional criteria and insurance authorization.  ? ?Follow Up Recommendations ?    ?  ?Assistance Recommended at Discharge PRN  ?Patient can return home with the following Assistance with  cooking/housework;Direct supervision/assist for financial management;Direct supervision/assist for medications management ? ?  ?Functional Status Assessment ? Patient has had a recent decline in their functional status and demonstrates the ability to make significant improvements in function in a reasonable and predictable amount of time.  ?Equipment Recommendations ? None recommended by OT  ?  ?Recommendations for Other Services   ? ? ?  ?Precautions / Restrictions Precautions ?Precautions: None ?Restrictions ?Weight Bearing Restrictions: No  ? ?  ? ?Mobility Bed Mobility ?Overal bed mobility: Independent ?  ?  ?  ?  ?  ?  ?  ?  ? ?Transfers ?Overall transfer level: Independent ?  ?  ?  ?  ?  ?  ?  ?  ?  ?  ? ?  ?Balance Overall balance assessment: Independent ?  ?  ?  ?  ?  ?  ?  ?  ?  ?  ?  ?  ?  ?  ?  ?  ?  ?  ?   ? ?ADL either performed or assessed with clinical judgement  ? ?ADL Overall ADL's : Independent;At baseline ?  ?  ?  ?  ?  ?  ?  ?  ?  ?  ?  ?  ?  ?  ?  ?  ?  ?  ?  ?General ADL Comments: Performing ADLs at baseline. Issuing Pillbox assessment to assess IADLs. See cognition section  ? ? ? ?Vision Baseline Vision/History: 1 Wears glasses (reading) ?   ?   ?Perception   ?  ?Praxis   ?  ? ?Pertinent Vitals/Pain Pain Assessment ?Pain Assessment: No/denies  pain  ? ? ? ?Hand Dominance Right ?  ?Extremity/Trunk Assessment Upper Extremity Assessment ?Upper Extremity Assessment: Overall WFL for tasks assessed ?  ?Lower Extremity Assessment ?Lower Extremity Assessment: Overall WFL for tasks assessed ?  ?Cervical / Trunk Assessment ?Cervical / Trunk Assessment: Normal ?  ?Communication Communication ?Communication: HOH ?  ?Cognition Arousal/Alertness: Awake/alert ?Behavior During Therapy: [facility] for tasks assessed/performed ?Overall Cognitive Status: Impaired/Different from baseline ?Area of Impairment: Problem solving ?  ?  ?  ?  ?  ?  ?  ?  ?  ?  ?  ?  ?  ?  ?Problem Solving: Slow processing ?General Comments:  Very pleasant and motivated. Requiring significant time to problem solve the "every morning pills" and initiate placing pills into box. Incorreectly organizing "three times daily" and "once at breakfast and dinner". Placing mutiple pills in single slots instead of spreading out through the day per instructions. Requiring 12 minutes to complete. ?  ?  ?General Comments  VSS. Daughter present throughout ? ?  ?Exercises   ?  ?Shoulder Instructions    ? ? ?Home Living Family/patient expects to be discharged to:: Private residence ?Living Arrangements: Alone ?Available Help at Discharge: Family;Available PRN/intermittently ?Type of Home: House ?Home Access: Stairs to enter ?Entrance Stairs-Number of Steps: 4-5 ?Entrance Stairs-Rails: Right ?Home Layout: Two level;Able to live on main level with bedroom/bathroom ?  ?  ?Bathroom Shower/Tub: Tub/shower unit ?  ?Bathroom Toilet: Standard ?  ?  ?Home Equipment: None ?  ?  ?  ? ?  ?Prior Functioning/Environment Prior Level of Function : Independent/Modified Independent;Driving ?  ?  ?  ?  ?  ?  ?Mobility Comments: Independent without DME; walks ?ADLs Comments: Indep with ADL/iADLs. 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  ? ?  ?  ?OT Problem List: Decreased cognition ?  ?   ?OT Treatment/Interventions:    ?  ?OT Goals(Current goals can be found in the care plan section) Acute Rehab OT Goals ?Patient Stated Goal: Go home soon ?OT Goal Formulation: With patient/family  ?OT Frequency:   ?  ? ?Co-evaluation   ?  ?  ?  ?  ? ?  ?AM-PAC OT "6 Clicks" Daily Activity     ?Outcome Measure Help from another person eating meals?: None ?Help from another person taking care of personal grooming?: None ?Help from another person toileting, which includes using toliet, bedpan, or urinal?: None ?Help from another person bathing (including washing, rinsing, drying)?: None ?Help from another person to put on and taking off regular upper body clothing?: None ?Help from another person to put on  and taking off regular lower body clothing?: None ?6 Click Score: 24 ?  ?End of Session Nurse Communication: Mobility status ? ?Activity Tolerance: Patient tolerated treatment well ?Patient left: in bed;with family/visitor present;with call bell/phone within reach ? ?OT Visit Diagnosis: Other symptoms and signs involving cognitive function  ?              ?Time: 0277-4128 ?OT Time Calculation (min): 26 min ?Charges:  OT General Charges ?$OT Visit: 1 Visit ?OT Evaluation ?$OT Eval Low Complexity: 1 Low ?OT Treatments ?$Self Care/Home Management : 8-22 mins ? ?Neala Miggins MSOT, OTR/L ?Acute Rehab ?Pager: [phone] ?Office: [phone] ? ?Zoi Devine M Delta Pichon ?06/06/2021, 2:12 PM ?

## 2021-06-06 NOTE — ED Notes (Signed)
Patient ambulated with this RN (as stand-by assist) to the restroom with minimal assistance; Gait fairly steady, but not to ambulate on his own. ?

## 2021-06-07 ENCOUNTER — Encounter (HOSPITAL_COMMUNITY)
Admission: EM | Disposition: A | Discharge status: Home / Self Care | Payer: Self-pay | Source: Home / Self Care | Attending: Emergency Medicine

## 2021-06-07 ENCOUNTER — Other Ambulatory Visit (HOSPITAL_COMMUNITY): Payer: Self-pay

## 2021-06-07 DIAGNOSIS — I10 Essential (primary) hypertension: Secondary | ICD-10-CM | POA: Diagnosis not present

## 2021-06-07 DIAGNOSIS — E785 Hyperlipidemia, unspecified: Secondary | ICD-10-CM | POA: Diagnosis not present

## 2021-06-07 DIAGNOSIS — I639 Cerebral infarction, unspecified: Secondary | ICD-10-CM | POA: Diagnosis not present

## 2021-06-07 DIAGNOSIS — I2510 Atherosclerotic heart disease of native coronary artery without angina pectoris: Secondary | ICD-10-CM | POA: Diagnosis not present

## 2021-06-07 DIAGNOSIS — I771 Stricture of artery: Secondary | ICD-10-CM | POA: Diagnosis not present

## 2021-06-07 HISTORY — PX: LOOP RECORDER INSERTION: EP1214

## 2021-06-07 SURGERY — LOOP RECORDER INSERTION

## 2021-06-07 MED ORDER — CLOPIDOGREL BISULFATE 75 MG PO TABS
75.0000 mg | ORAL_TABLET | Freq: Every day | ORAL | 3 refills | Status: DC
  Filled 2021-06-07: qty 30, 30d supply, fill #0

## 2021-06-07 MED ORDER — LIDOCAINE-EPINEPHRINE 1 %-1:100000 IJ SOLN
INTRAMUSCULAR | Status: AC
  Filled 2021-06-07: qty 1

## 2021-06-07 MED ORDER — LIDOCAINE-EPINEPHRINE 1 %-1:100000 IJ SOLN
INTRAMUSCULAR | Status: DC | PRN
  Administered 2021-06-07: 20 mL

## 2021-06-07 SURGICAL SUPPLY — 2 items
PACK LOOP INSERTION (CUSTOM PROCEDURE TRAY) ×3 IMPLANT
SYSTEM MONITOR REVEAL LINQ II (Prosthesis & Implant Heart) ×1 IMPLANT

## 2021-06-07 NOTE — Plan of Care (Signed)
?  Problem: Education: ?Goal: Knowledge of disease or condition will improve ?Outcome: Progressing ?Goal: Knowledge of secondary prevention will improve (SELECT ALL) ?Outcome: Progressing ?Goal: Knowledge of patient specific risk factors will improve (INDIVIDUALIZE FOR PATIENT) ?Outcome: Progressing ?  ?

## 2021-06-07 NOTE — Hospital Course (Signed)
86 year old hard of hearing with history of AAA status post EVAR, hypertension and coronary artery disease was driving back home and had trouble remembering the route and felt dizzy.  Called EMS and came to ER.  No focal deficit, confusion cleared.  MRI with multifocal stroke. ?

## 2021-06-07 NOTE — TOC Transition Note (Addendum)
Transition of Care (TOC) - CM/SW Discharge Note ? ? ?Patient Details  ?Name: Justin Lyons ?MRN: [id number] ?Date of Birth: 07-12-1935 ? ?Transition of Care (TOC) CM/SW Contact:  ?Pollie Friar, RN ?Phone Number: ?06/07/2021, 11:28 AM ? ? ?Clinical Narrative:    ?Patient is discharging home with self care. Patient lives alone but son checks in on him.  ?Pt drives self as needed. He manages his own medications and denies any issues.  ?No f/u per PT.  ?ST recommending outpatient therapy. CM has arranged outpatient ST through [facility] as pt lives in [place]. Information on the AVS. ?Son to provide transport home.  ? ? ?Final next level of care: Home/Self Care ?Barriers to Discharge: No Barriers Identified ? ? ?Patient Goals and CMS Choice ?  ?  ?  ? ?Discharge Placement ?  ?           ?  ?  ?  ?  ? ?Discharge Plan and Services ?  ?  ?           ?  ?  ?  ?  ?  ?  ?  ?  ?  ?  ? ?Social Determinants of Health (SDOH) Interventions ?  ? ? ?Readmission Risk Interventions ?   ? View : No data to display.  ?  ?  ?  ? ? ? ? ? ?

## 2021-06-07 NOTE — Care Management Obs Status (Signed)
MEDICARE OBSERVATION STATUS NOTIFICATION ? ? ?Patient Details  ?Name: Justin Lyons ?MRN: [id number] ?Date of Birth: [birth year] ? ? ?Medicare Observation Status Notification Given:  Yes ? ? ? ?Carles Collet, RN ?06/07/2021, 8:46 AM ?

## 2021-06-07 NOTE — Discharge Summary (Signed)
?Physician Discharge Summary ?  ?Patient: Justin Lyons MRN: [id number] DOB: Mar 07, 1935  ?Admit date:     06/05/2021  ?Discharge date: 06/07/21  ?Discharge Physician: Edwin Dada  ? ?PCP: Manon Hilding, MD  ? ? ? ?Recommendations at discharge:  ?Dr. Quintin Alto: Patient scored 73 of 39 on SLUMS, please refer for comprehensive dementia evaluation if not already completed ?Dr. Quintin Alto: Please review diclofenac and consider alternatives if available while patient on DAPT ?Follow up with Neurology in 6-8 weeks ?Follow up with Cardiology for new Loop Recorder ? ? ? ? ? ?Discharge Diagnoses: ?Principal Problem: ?  Acute embolic stroke ([place]) ?Active Problems: ?  Hyperlipidemia ?  Hypertension ?  Coronary atherosclerosis ?  AAA (abdominal aortic aneurysm) ([place]) ? ?  ? ? ?Hospital Course: ?[name] is an 86 y.o. M with hx HTN, CAD, AAA s/p EVAR and hearing loss who was driving and had trouble remembering the route and felt dizzy.  Presented to the ER where he had no focal deficits, and his confusion resolved.  MRI brain showed multifocal strokes.  ? ? ? ? ?* Acute embolic stroke ([place]) ?MRI brain shows multifocal small infarcts.  Angiography with CTA showed showed left subclavian disease, no other neck or intracranial severe stenoses.  Echocardiogram showed no cardiogenic source, monitoring on telemetry overnight showed no atrial fibrillation.  A loop recorder was placed. ? ?Lipids were obtained, the LDL was 57, he was continued on his atorvastatin.  He was discharged on aspirin and Plavix for 3 weeks, followed by Plavix alone indefinitely.  PT recommended no PT follow-up. ? ?Speech therapy noted some cognitive deficits, recommended ongoing speech therapy which was arranged as an outpatient, in addition recommended PCP follow-up for cognitive testing. ? ?Non-smoker. ? ? ?  ?  ? ? ? ? ? ?[facility] Controlled Substances Registry was reviewed for this patient prior to discharge. ? ?Consultants: Neurology,  electrophysiology ?Procedures performed: Echocardiogram, MRI brain, CT angiogram of the head and neck, loop recorder insertion, ?Disposition: Home ?Diet recommendation:  ?Discharge Diet Orders (From admission, onward)  ? ?  Start     Ordered  ? 06/07/21 0000  Diet - low sodium heart healthy       ? 06/07/21 1244  ? ?  ?  ? ?  ? ? ? ?DISCHARGE MEDICATION: ?Allergies as of 06/07/2021   ? ?   Reactions  ? Doxycycline Rash  ? Penicillins Rash  ? Sulfonamide Derivatives Rash  ? ?  ? ?  ?Medication List  ?  ? ?TAKE these medications   ? ?albuterol 108 (90 Base) MCG/ACT inhaler ?Commonly known as: VENTOLIN HFA ?Inhale 1-2 puffs into the lungs every 6 (six) hours as needed for wheezing or shortness of breath. ?  ?albuterol (2.5 MG/3ML) 0.083% nebulizer solution ?Commonly known as: PROVENTIL ?Take 2.5 mg by nebulization every 6 (six) hours as needed for wheezing or shortness of breath. ?  ?allopurinol 100 MG tablet ?Commonly known as: ZYLOPRIM ?Take 100 mg by mouth daily. ?  ?aspirin 81 MG tablet ?Take 81 mg by mouth daily. ?  ?atorvastatin 80 MG tablet ?Commonly known as: LIPITOR ?Take 1 tablet (80 mg total) by mouth daily. ?What changed: when to take this ?  ?clopidogrel 75 MG tablet ?Commonly known as: PLAVIX ?Take 1 tablet (75 mg total) by mouth daily. ?Start taking on: Jun 08, 2021 ?  ?diclofenac 75 MG EC tablet ?Commonly known as: VOLTAREN ?Take 1 tablet by mouth 2 (two) times daily. ?  ?  metoprolol tartrate 25 MG tablet ?Commonly known as: LOPRESSOR ?Take 25 mg by mouth 2 (two) times daily. ?  ?nitroGLYCERIN 0.4 MG SL tablet ?Commonly known as: Nitrostat ?Place 1 tablet (0.4 mg total) under the tongue every 5 (five) minutes as needed. ?What changed: reasons to take this ?  ?pantoprazole 40 MG tablet ?Commonly known as: Protonix ?Take 1 tablet (40 mg total) by mouth daily. ?  ?tiZANidine 2 MG tablet ?Commonly known as: ZANAFLEX ?Take 2 mg by mouth at bedtime. ?  ?Vitamin D 1000 units capsule ?Take 1,000 Units by mouth  daily. ?  ? ?  ? ? Follow-up Information   ? ? [facility]. Schedule an appointment as soon as possible for a visit in 1 month(s).   ?Why: They will call you for an appointment, call if you haven't heard from them, stroke clinic ?Contact information: ?[address]     [place] [address] ?[phone] ? ?  ?  ? ? Sasser, Silvestre Moment, MD. Schedule an appointment as soon as possible for a visit in 1 week(s).   ?Specialty: Family Medicine ?Contact information: ?[address] ?[place] [address] ?[phone] ? ? ?  ?  ? ? [facility] Follow up.   ?Specialty: Rehabilitation ?Why: The outpatient therapy will contact you for the first appointment ?Contact information: ?[address] ?[id number] ?[place] ?[phone] ? ?  ?  ? ?  ?  ? ?  ? ? ?Discharge Instructions   ? ? Ambulatory referral to Neurology   Complete by: As directed ?  ? Follow up with stroke clinic NP [facility], if not available, consider Leonie Man, [place], or [place]) at [facility] in about 4 weeks. Thanks.  ? Ambulatory referral to Speech Therapy   Complete by: As directed ?  ? For cognitive therapies ?Diagnosis is stroke ICD I63.9  ? Diet - low sodium heart healthy   Complete by: As directed ?  ? Discharge instructions   Complete by: As directed ?  ? From Dr. Loleta Books: ?You were admitted for small strokes ?Because there were multiple small strokes scattered around, we have a strong suspicion that your strokes were from Afib, but we haven't been able to prove it. ? ?To prove it, you should use the implanted heart monitor and follow closely with the heart specialists who place it and also with the Neurologists. ? ?In the meantime, to prevent another stroke: ?START taking the new medicine clopidogrel/Plavix ?This is like a super aspirin and prevents platelets from sticking together to start clots. ? ?Continue your normal aspirin 81 mg daily and START  clopidogrel/Plavix 75 mg daily when you leave the hospital ?In three weeks, STOP the aspirin and just conitnue clopidogrel/Plavix life long ? ?Go see the Neurologists at [facility] in 6-8 weeks for follow up ?I sent a referral and they will call you for an appiontment ? ?Go see your primary care doctor in 1 week for a follow up, so he can review the medicine changes we made and answer questions that arise ? ?Continue your protonix to suppress stomach acid. ?Now that you are taking Plavix, you should probably STOP diclofenac your arthritis medicine for the next 3 weeks ?Discuss this medicine with your doctor and ask if it is safe to continue ? ?Follow up with Cardiology as you are instructed by the Cardiology team  ? Increase activity slowly   Complete by: As directed ?  ? ?  ? ? ?  Discharge Exam: ?Danley Danker Weights  ? 06/05/21 1253  ?Weight: 79.4 kg  ? ? ?General: Pt is alert, awake, not in acute distress ?Cardiovascular: RRR, nl S1-S2, no murmurs appreciated.   No LE edema.   ?Respiratory: Normal respiratory rate and rhythm.  CTAB without rales or wheezes. ?Abdominal: Abdomen soft and non-tender.  No distension or HSM.   ?Neuro/Psych: Strength symmetric in upper and lower extremities.  Judgment and insight appear mildly impaired. ? ? ?Condition at discharge: good ? ?The results of significant diagnostics from this hospitalization (including imaging, microbiology, ancillary and laboratory) are listed below for reference.  ? ?Imaging Studies: ?CT ANGIO HEAD NECK W WO CM ? ?Addendum Date: 06/06/2021   ?ADDENDUM REPORT: 06/06/2021 18:10 ADDENDUM: After further review with Dr. 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 This is unchanged from the CT angio chest 04/06/2017. Stenosis appears atherosclerotic and proximal left vertebral artery. Left vertebral artery origin is patent. Electronically Signed   By: Franchot Gallo M.D.   On: 06/06/2021 18:10  ? ?Result Date:  06/06/2021 ?CLINICAL DATA:  Stroke follow-up EXAM: CT ANGIOGRAPHY HEAD AND NECK TECHNIQUE: Multidetector CT imaging of the head and neck was performed using the standard protocol during bolus administration of intrav

## 2021-06-07 NOTE — Consult Note (Addendum)
? ? ?ELECTROPHYSIOLOGY CONSULT NOTE  ?Patient ID: Justin Lyons ?MRN: [id number], DOB/AGE: Sep 11, 1935  ? ?Admit date: 06/05/2021 ?Date of Consult: 06/07/2021 ? ?Primary Physician: Manon Hilding, MD ?Primary Cardiologist: Carlyle Dolly, MD  ?Primary Electrophysiologist: New to Dr. Curt Bears ?Reason for Consultation: Cryptogenic stroke; recommendations regarding Implantable Loop Recorder ?Insurance: [employer] ? ?History of Present Illness ?EP has been asked to evaluate Justin Lyons for placement of an implantable loop recorder to monitor for atrial fibrillation by Dr Erlinda Hong.  The patient was admitted on 06/05/2021 with dizziness and confusion ? ?Imaging demonstrated punctate area of ischemia in the left parietal, occipital, and cerebellar SCA infarct likely secondary embolic source.   ? ?She has undergone workup for stroke:  ?Code Stroke CT head More subtle focal hypoattenuation in the right thalamus appears remote. This could be more acute. 7 mm lacunar infarcts of the right lentiform nucleus appears remote. ?CTA head & neck No LVO ?MRI punctate foci of acute ischemia within the posterior left parietal, left occipital, and left cerebellum (more at [facility] territory instead of PICA) ?2D Echo EF 60 to 65% ?Lower extremity venous duplex-no DVT ?Recommend loop recorder to rule out A-fib ?LDL 57 ?HgbA1c 5.8 ?VTE prophylaxis - lovenox ?aspirin 81 mg daily prior to admission, now on aspirin 81 mg daily and clopidogrel 75 mg daily for 3 weeks and then plavix alone. ?Therapy recommendations:  None ?Disposition:  Home ? ? ?The patient has been monitored on telemetry which has demonstrated sinus rhythm with no arrhythmias.  Inpatient stroke work-up Theodor Mustin not require a TEE per Neurology.  ? ?Echocardiogram as above. Lab work is reviewed. ? ?Prior to admission, the patient denies chest pain, shortness of breath, dizziness, palpitations, or syncope.  He is recovering from his stroke with plans to return home  at discharge. ? ?Past  Medical History:  ?Diagnosis Date  ? AAA (abdominal aortic aneurysm) ([place])   ? 06/02/2003 old BPG  ? Arthritis   ? Carotid artery occlusion   ? Carotid bruit   ? COPD (chronic obstructive pulmonary disease) ([place])   ? Coronary artery disease   ? Diverticulosis of colon   ? Erectile dysfunction   ? Hemorrhoids, internal   ? History of compression fracture of spine   ? History of gout   ? Hyperlipidemia   ? Hypertension   ? Knee pain   ? Myocardial infarction [facility]) 2005  ? Peripheral vascular disease ([place])   ?  ? ?Surgical History:  ?Past Surgical History:  ?Procedure Laterality Date  ? ABDOMINAL AORTIC ENDOVASCULAR STENT GRAFT N/A 04/07/2017  ? Procedure: ENDOVASCULAR ABDOMINAL ANEURYSM REPAIR;  Surgeon: Serafina Mitchell, MD;  Location: [place];  Service: Vascular;  Laterality: N/A;  ? CORONARY ANGIOPLASTY  2005  ?  ? ?Medications Prior to Admission  ?Medication Sig Dispense Refill Last Dose  ? albuterol (PROVENTIL) (2.5 MG/3ML) 0.083% nebulizer solution Take 2.5 mg by nebulization every 6 (six) hours as needed for wheezing or shortness of breath.   unk  ? albuterol (VENTOLIN HFA) 108 (90 Base) MCG/ACT inhaler Inhale 1-2 puffs into the lungs every 6 (six) hours as needed for wheezing or shortness of breath.   unk  ? allopurinol (ZYLOPRIM) 100 MG tablet Take 100 mg by mouth daily.   06/05/2021  ? aspirin 81 MG tablet Take 81 mg by mouth daily.   06/05/2021  ? atorvastatin (LIPITOR) 80 MG tablet Take 1 tablet (80 mg total) by mouth daily. (Patient taking differently: Take 80 mg by  mouth at bedtime.) 90 tablet 3 06/04/2021  ? Cholecalciferol (VITAMIN D) 1000 UNITS capsule Take 1,000 Units by mouth daily.   06/05/2021  ? diclofenac (VOLTAREN) 75 MG EC tablet Take 1 tablet by mouth 2 (two) times daily.   06/05/2021  ? metoprolol tartrate (LOPRESSOR) 25 MG tablet Take 25 mg by mouth 2 (two) times daily.    06/05/2021 at 0800  ? nitroGLYCERIN (NITROSTAT) 0.4 MG SL tablet Place 1 tablet (0.4 mg total) under the tongue every 5 (five)  minutes as needed. (Patient taking differently: Place 0.4 mg under the tongue every 5 (five) minutes as needed for chest pain.) 25 tablet 3 unk  ? pantoprazole (PROTONIX) 40 MG tablet Take 1 tablet (40 mg total) by mouth daily. 90 tablet 3 06/05/2021  ? tiZANidine (ZANAFLEX) 2 MG tablet Take 2 mg by mouth at bedtime.   06/04/2021  ? ? ?Inpatient Medications:  ? allopurinol  100 mg Oral Daily  ? aspirin  81 mg Oral Daily  ? atorvastatin  80 mg Oral QHS  ? clopidogrel  75 mg Oral Daily  ? diclofenac  75 mg Oral BID  ? enoxaparin (LOVENOX) injection  40 mg Subcutaneous Q24H  ? pantoprazole  40 mg Oral Daily  ? tiZANidine  2 mg Oral QHS  ? ? ?Allergies:  ?Allergies  ?Allergen Reactions  ? Doxycycline Rash  ? Penicillins Rash  ? Sulfonamide Derivatives Rash  ? ? ?Social History  ? ?Socioeconomic History  ? Marital status: Single  ?  Spouse name: Not on file  ? Number of children: Not on file  ? Years of education: Not on file  ? Highest education level: Not on file  ?Occupational History  ? Occupation: part time in a pharmacy  ?  Employer: RETIRED  ?Tobacco Use  ? Smoking status: Former  ?  Packs/day: 1.50  ?  Years: 20.00  ?  Pack years: 30.00  ?  Types: Cigarettes  ?  Quit date: 01/22/1982  ?  Years since quitting: 39.4  ? Smokeless tobacco: Never  ?Vaping Use  ? Vaping Use: Never used  ?Substance and Sexual Activity  ? Alcohol use: No  ? Drug use: No  ? Sexual activity: Not on file  ?Other Topics Concern  ? Not on file  ?Social History Narrative  ? Not on file  ? ?Social Determinants of Health  ? ?Financial Resource Strain: Not on file  ?Food Insecurity: Not on file  ?Transportation Needs: Not on file  ?Physical Activity: Not on file  ?Stress: Not on file  ?Social Connections: Not on file  ?Intimate Partner Violence: Not on file  ?  ? ?Family History  ?Problem Relation Age of Onset  ? Heart disease Father   ?     Heart Disease before age 66  ? Heart attack Father   ? Cancer Mother   ?     Colon  ? Heart disease Mother   ?  Hyperlipidemia Mother   ? Hypertension Mother   ? Heart attack Mother   ? Colon cancer Mother   ? Cancer Brother   ?     lung cancer  ?  ? ? ?Review of Systems: ?All other systems reviewed and are otherwise negative except as noted above. ? ?Physical Exam: ?Vitals:  ? 06/07/21 0429 06/07/21 0733 06/07/21 0805 06/07/21 7106  ?BP: (!) 112/59 139/63    ?Pulse: 81 79    ?Resp: 18 18    ?Temp: 97.9 ?F (36.6 ?C) 99.8 ?  F (37.7 ?C)    ?TempSrc: Oral Oral    ?SpO2: 96% 91% 91% 96%  ?Weight:      ?Height:      ? ? ?GEN- The patient is well appearing, alert and oriented x 3 today.   ?Head- normocephalic, atraumatic ?Eyes-  Sclera clear, conjunctiva pink ?Ears- hearing intact ?Oropharynx- clear ?Neck- supple ?Lungs- Clear to ausculation bilaterally, normal work of breathing ?Heart- Regular rate and rhythm, no murmurs, rubs or gallops  ?GI- soft, NT, ND, + BS ?Extremities- no clubbing, cyanosis, or edema ?MS- no significant deformity or atrophy ?Skin- no rash or lesion ?Psych- euthymic mood, full affect ?Neuro- no current deficits ? ? ?Labs: ?  ?Lab Results  ?Component Value Date  ? WBC 10.1 06/05/2021  ? HGB 14.3 06/05/2021  ? HCT 42.0 06/05/2021  ? MCV 97.9 06/05/2021  ? PLT 180 06/05/2021  ?  ?Recent Labs  ?Lab 06/05/21 ?1308 06/05/21 ?1430  ?NA 141 142  ?K 3.5 3.5  ?CL 106 106  ?CO2 26  --   ?BUN 15 17  ?CREATININE 1.13 1.10  ?CALCIUM 8.6*  --   ?PROT 6.8  --   ?BILITOT 0.9  --   ?ALKPHOS 99  --   ?ALT 44  --   ?AST 39  --   ?GLUCOSE 95 86  ? ?  ?Radiology/Studies: CT ANGIO HEAD NECK W WO CM ? ?Addendum Date: 06/06/2021   ?ADDENDUM REPORT: 06/06/2021 18:10 ADDENDUM: After further review with Dr. 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 This is unchanged from the CT angio chest 04/06/2017. Stenosis appears atherosclerotic and proximal left vertebral artery. Left vertebral artery origin is patent. Electronically Signed   By: Franchot Gallo M.D.   On: 06/06/2021 18:10  ? ?Result Date:  06/06/2021 ?CLINICAL DATA:  Stroke follow-up EXAM: CT ANGIOGRAPHY HEAD AND NECK TECHNIQUE: Multidetector CT imaging of the head and neck was performed using the standard protocol during bolus administration of i

## 2021-06-07 NOTE — TOC CAGE-AID Note (Signed)
Transition of Care (TOC) - CAGE-AID Screening ? ? ?Patient Details  ?Name: KACI FREEL ?MRN: [id number] ?Date of Birth: [birth year] ? ?Transition of Care (TOC) CM/SW Contact:    ?Dorr Perrot C Tarpley-Carter, LCSWA ?Phone Number: ?06/07/2021, 1:32 PM ? ? ?Clinical Narrative: ?Pt participated in [facility].  Pt stated he does not use substance or ETOH.  Pt was not offered resources, due to no usage of substance or ETOH.    ? ?Passenger transport manager, MSW, LCSW-A ?Pronouns:  She/Her/Hers ?[facility] of Care ?Clinical Social Worker ?Direct Number:  [phone] ?[email or web address]  ? ?CAGE-AID Screening: ?  ? ?Have You Ever Felt You Ought to Cut Down on Your Drinking or Drug Use?: No ?Have People Annoyed You By Critizing Your Drinking Or Drug Use?: No ?Have You Felt Bad Or Guilty About Your Drinking Or Drug Use?: No ?Have You Ever Had a Drink or Used Drugs First Thing In The Morning to Steady Your Nerves or to Get Rid of a Hangover?: No ?CAGE-AID Score: 0 ? ?Substance Abuse Education Offered: No ? ?  ? ? ? ? ? ? ?

## 2021-06-07 NOTE — Evaluation (Signed)
Speech Language Pathology Evaluation ?Patient Details ?Name: Justin Lyons ?MRN: [id number] ?DOB: 1935/10/04 ?Today's Date: 06/07/2021 ?Time: 8242-3536 ?SLP Time Calculation (min) (ACUTE ONLY): 30 min ? ?Problem List:  ?Patient Active Problem List  ? Diagnosis Date Noted  ? Acute embolic stroke ([place]) 14/43/1540  ? Acute viral syndrome 06/03/2020  ? Influenza-like illness 06/03/2020  ? Tick bite 06/03/2020  ? Neoplasm of uncertain behavior of skin 06/11/2017  ? Dysuria 04/15/2017  ? AAA (abdominal aortic aneurysm) ([place]) 04/06/2017  ? Allergic rhinitis 10/24/2016  ? Salmonella infection 10/24/2016  ? Body mass index 25.0-25.9, adult 04/01/2016  ? Rash and other nonspecific skin eruption 04/01/2016  ? Wheezing 04/01/2016  ? Cough 11/16/2015  ? Need for prophylactic vaccination and inoculation against single disease 10/04/2015  ? Impacted cerumen 08/01/2015  ? Prominent popliteal pulse 01/28/2015  ? Diarrhea 08/08/2014  ? Fever due to unspecified condition 08/08/2014  ? Hip, thigh, leg, and ankle, insect bite, nonvenomous 05/20/2014  ? Routine general medical examination at a health care facility 01/03/2014  ? Family history of malignant neoplasm of gastrointestinal tract 12/29/2013  ? Diverticulitis 12/07/2013  ? Abdominal aneurysm without mention of rupture 12/05/2011  ? Acute bronchitis 07/12/2010  ? KNEE PAIN 05/31/2009  ? CAROTID BRUIT 11/02/2007  ? ERECTILE DYSFUNCTION 05/02/2007  ? COPD 05/02/2007  ? GOUT 04/29/2007  ? DIVERTICULOSIS OF COLON 04/29/2007  ? COMPRESSION FRACTURE 04/29/2007  ? Hyperlipidemia 11/05/2006  ? Hypertension 11/05/2006  ? Coronary atherosclerosis 11/05/2006  ? PERIPHERAL VASCULAR DISEASE 11/05/2006  ? HEMORRHOIDS, INTERNAL 11/05/2006  ? ?Past Medical History:  ?Past Medical History:  ?Diagnosis Date  ? AAA (abdominal aortic aneurysm) ([place])   ? 06/02/2003 old BPG  ? Arthritis   ? Carotid artery occlusion   ? Carotid bruit   ? COPD (chronic obstructive pulmonary disease) ([place])   ? Coronary  artery disease   ? Diverticulosis of colon   ? Erectile dysfunction   ? Hemorrhoids, internal   ? History of compression fracture of spine   ? History of gout   ? Hyperlipidemia   ? Hypertension   ? Knee pain   ? Myocardial infarction [facility]) 2005  ? Peripheral vascular disease ([place])   ? ?Past Surgical History:  ?Past Surgical History:  ?Procedure Laterality Date  ? ABDOMINAL AORTIC ENDOVASCULAR STENT GRAFT N/A 04/07/2017  ? Procedure: ENDOVASCULAR ABDOMINAL ANEURYSM REPAIR;  Surgeon: Serafina Mitchell, MD;  Location: [place];  Service: Vascular;  Laterality: N/A;  ? CORONARY ANGIOPLASTY  2005  ? ?HPI:  ?86yo male admitted 06/05/21 with AMS including confusion and dizziness. PMH: AAA s/p EVAR, HTN, CAD, arthritis, HLD, MI, PVD. MRI acute ischemic punctate foci within posterior left parietal, left occuipital, and left cerebellum  ? ?Assessment / Plan / Recommendation ?Clinical Impression ? Pt seen for cognitive linguistic evaluation prior to discharge. Pt reports having a high school education. He is a retired Engineer, structural. Speech and Language are intact. The [place] Mental Status (SLUMS) Examination was administered today. Pt scored 18/30 (n=27+/30), raising concern for neurocognitive disorder. Pt was fully oriented. He had difficulty with immediate and delayed recall, thought organization, digit reversal, clock drawing (difficulty with placement of hands), and auditory attention and recall.  ? ?Difficulty with memory, attention and executive functions has the potential for significant safety issues, which is a concern - pt lives alone with check-ins by his son. Pt manages his finances and medications independently. Given level of independence prior to admit, home health speech therapy is recommended  to maximize cognition, independence, and safety. Team notified. ?   ?SLP Assessment ? SLP Recommendation/Assessment: All further Speech Language Pathology needs can be addressed in the next venue of care ? ?SLP  Visit Diagnosis: Cognitive communication deficit (R41.841)  ?  ?Recommendations for follow up therapy are one component of a multi-disciplinary discharge planning process, led by the attending physician.  Recommendations may be updated based on patient status, additional functional criteria and insurance authorization. ?   ?Follow Up Recommendations ? Home health SLP  ?  ?Assistance Recommended at Discharge ? Frequent or constant Supervision/Assistance  ?Functional Status Assessment Patient has had a recent decline in their functional status and demonstrates the ability to make significant improvements in function in a reasonable and predictable amount of time.  ?   ?SLP Evaluation ?Cognition ? Overall Cognitive Status: Impaired/Different from baseline ?Arousal/Alertness: Awake/alert ?Orientation Level: Oriented X4  ?  ?   ?Comprehension ? Auditory Comprehension ?Overall Auditory Comprehension: Appears within functional limits for tasks assessed  ?  ?Expression Expression ?Primary Mode of Expression: Verbal ?Verbal Expression ?Overall Verbal Expression: Appears within functional limits for tasks assessed ?Written Expression ?Dominant Hand: Right   ?Oral / Motor ? Oral Motor/Sensory Function ?Overall Oral Motor/Sensory Function: Within functional limits ?Motor Speech ?Overall Motor Speech: Appears within functional limits for tasks assessed ?Intelligibility: Intelligible   ?        ?Amardeep Beckers B. Traeh Milroy, MSP, CCC-SLP ?Speech Language Pathologist ?Office: [phone] ? ?Shonna Chock ?06/07/2021, 1:39 PM ? ?

## 2021-06-07 NOTE — Progress Notes (Signed)
STROKE TEAM PROGRESS NOTE  ? ?INTERVAL HISTORY ?No family at the bedside. He is doing well, still has hard of hearing, plan to have loop placed before discharge today.  ? ?Vitals:  ? 06/07/21 0733 06/07/21 0805 06/07/21 0808 06/07/21 1156  ?BP: 139/63   (!) 155/62  ?Pulse: 79   84  ?Resp: 18   18  ?Temp: 99.8 ?F (37.7 ?C)   98 ?F (36.7 ?C)  ?TempSrc: Oral   Oral  ?SpO2: 91% 91% 96% 93%  ?Weight:      ?Height:      ? ?CBC:  ?Recent Labs  ?Lab 06/05/21 ?1308 06/05/21 ?1430  ?WBC 10.1  --   ?NEUTROABS 7.1  --   ?HGB 13.7 14.3  ?HCT 42.0 42.0  ?MCV 97.9  --   ?PLT 180  --   ? ?Basic Metabolic Panel:  ?Recent Labs  ?Lab 06/05/21 ?1308 06/05/21 ?1430  ?NA 141 142  ?K 3.5 3.5  ?CL 106 106  ?CO2 26  --   ?GLUCOSE 95 86  ?BUN 15 17  ?CREATININE 1.13 1.10  ?CALCIUM 8.6*  --   ? ?Lipid Panel:  ?Recent Labs  ?Lab 06/06/21 ?0333  ?CHOL 98  ?TRIG 63  ?HDL 28*  ?CHOLHDL 3.5  ?VLDL 13  ?[place] 57  ? ?HgbA1c:  ?Recent Labs  ?Lab 06/06/21 ?0333  ?HGBA1C 5.8*  ? ?Urine Drug Screen:  ?Recent Labs  ?Lab 06/05/21 ?1303  ?LABOPIA NONE DETECTED  ?COCAINSCRNUR NONE DETECTED  ?LABBENZ NONE DETECTED  ?AMPHETMU NONE DETECTED  ?THCU NONE DETECTED  ?LABBARB NONE DETECTED  ?  ?Alcohol Level  ?Recent Labs  ?Lab 06/05/21 ?1308  ?ETH <10  ? ? ?IMAGING past 24 hours ?No results found. ? ?PHYSICAL EXAM ? ?Physical Exam  ?Constitutional: Appears well-developed and well-nourished.  ?Cardiovascular: Normal rate and regular rhythm.  ?Respiratory: Effort normal, non-labored breathing ? ?Neuro: ?Mental Status: ?Patient is awake, alert, oriented to person, place, month, year, and situation. ?Patient is able to give a clear and coherent history. ?No signs of aphasia or neglect ?Cranial Nerves: ?II: Visual Fields are full. Pupils are equal, round, and reactive to light.   ?III,IV, VI: EOMI without ptosis or diploplia.  ?V: Facial sensation is symmetric to temperature ?VII: Facial movement is symmetric resting and smiling ?VIII: Hearing is intact to voice ?X:  Palate elevates symmetrically ?XI: Shoulder shrug is symmetric. ?XII: Tongue protrudes midline without atrophy or fasciculations.  ?Motor: ?Tone is normal. Bulk is normal. 5/5 strength was present in all four extremities.  ?Sensory: ?Sensation is symmetric to light touch and temperature in the arms and legs. No extinction to DSS present.  ?Cerebellar: ?FNF and HKS are intact bilaterally ?No orbiting, RAM intact ? ? ? ?ASSESSMENT/PLAN ?Justin Lyons is a 86 y.o. male with history of COPD, hypertension, hyperlipidemia, peripheral vascular disease, prior history of an MI, presented to the emergency room for evaluation of confusion and dizziness with last known well sometime around 10 PM on 06/04/2021. Currently he is reporting no dizziness or confusion. He was previously on ASA '81mg'$  plan for DAPT and then plavix alone for life. Consider loop recorder placement.  ? ?Stroke:  Punctate area of ischemia in the left parietal, occipital and cerebellar SCA infarct likely secondary embolic source ?Code Stroke CT head More subtle focal hypoattenuation in the right thalamus appears remote. This could be more acute. 7 mm lacunar infarcts of the right lentiform nucleus appears remote. ?CTA head & neck No LVO ?MRI punctate foci of  acute ischemia within the posterior left parietal, left occipital, and left cerebellum (more at [facility] instead of [facility]) ?2D Echo EF 60 to 65% ?Lower extremity venous duplex-no DVT ?Plan to place loop recorder to rule out A-fib prior to discharge ?LDL 57 ?HgbA1c 5.8 ?VTE prophylaxis - lovenox ?aspirin 81 mg daily prior to admission, now on aspirin 81 mg daily and clopidogrel 75 mg daily for 3 weeks and then plavix alone. ?Therapy recommendations:  None ?Disposition:  home today ? ?Hypertension ?Home meds:  Lopressor '25mg'$  BID ?Stable ?Long-term BP goal normotensive ? ?Hyperlipidemia ?Home meds:  Atorvastatin '80mg'$ , resumed in hospital ?LDL 57, goal < 70 ?Continue statin at discharge ? ?Left  subclavian chronic stenosis ?Left proximal subclavian high grade stenosis - chronic - proximal to left VA take off ?Left arm decreased pulse ?Left hand colder than right ?Denies pain, but admitted chronically weaker than left ?Continue follow up with [facility] as outpt ?Not likely the cause of the left cerebellar stroke this time, as the current stroke more at [facility] not at [facility].  ? ?Other Stroke Risk Factors ?Advanced Age >/= 32  ?Coronary artery disease ?Hx of MI, has taken plavix before with no issue, previous Cath in 2005, remote hx of PTCA to RCA ?Recently saw Dr. Harl Bowie ?PVD ? ?Other Active Problems ?AAA s/p endovascular repair ?Follows with [facility]  ?COPD ?No acute exacerbation ? ?Hospital day # 0 ? ?Neurology will sign off. Please call with questions. Pt will follow up with stroke clinic NP at [facility] in about 4 weeks. Thanks for the consult. ? ? ?Rosalin Hawking, MD PhD ?Stroke Neurology ?06/07/2021 ?12:51 PM ? ? ? ?To contact Stroke Continuity provider, please refer to [email or web address] ?After hours, contact General Neurology ? ?

## 2021-06-08 ENCOUNTER — Encounter (HOSPITAL_COMMUNITY): Payer: Self-pay | Admitting: Cardiology

## 2021-06-12 ENCOUNTER — Ambulatory Visit (HOSPITAL_COMMUNITY)
Admission: RE | Admit: 2021-06-12 | Discharge: 2021-06-12 | Disposition: A | Discharge status: Home / Self Care | Payer: Medicare HMO | Source: Ambulatory Visit | Attending: Surgery | Admitting: Surgery

## 2021-06-12 ENCOUNTER — Encounter: Payer: Self-pay | Admitting: Physician Assistant

## 2021-06-12 ENCOUNTER — Ambulatory Visit (INDEPENDENT_AMBULATORY_CARE_PROVIDER_SITE_OTHER): Payer: Medicare HMO | Admitting: Physician Assistant

## 2021-06-12 VITALS — BP 152/69 | HR 93 | Temp 98.2°F | Resp 18 | Ht 70.0 in | Wt 165.0 lb

## 2021-06-12 DIAGNOSIS — I714 Abdominal aortic aneurysm, without rupture, unspecified: Secondary | ICD-10-CM | POA: Insufficient documentation

## 2021-06-12 NOTE — Progress Notes (Signed)
[facility] [place] HISTORY AND PHYSICAL   History of Present Illness:  Patient is a 86 y.o. year old male who presents for evaluation of abdominal aortic aneurysm.  He is s/p EVAR repair of symptomatic 5.4 cm AAA by Dr. Trula Slade on 04/07/2017.    He denies abdominal/lumbar pain.  He denies symptoms of ischemia claudication, rest pain or non healing wounds. The last duplex largest diameter was 4.4 cm.  He recently went to [facility] ED with reports of dizziness and short term memory loss.  MRI reveled Punctate foci of acute ischemia within the posterior left parietal lobe, left occipital lobe and left cerebellum. No hemorrhage or mass effect.  He had a CTA of his carotids which demonstrated B ICA < 39% stenosis.  A duplex of the carotids was done 1 year ago and also demonstrated < 39% stenosis.   He is medically managed on ASA, Plavix, and Lipitor daily.    Past Medical History:  Diagnosis Date   AAA (abdominal aortic aneurysm) ([place])    06/02/2003 old BPG   Arthritis    Carotid artery occlusion    Carotid bruit    COPD (chronic obstructive pulmonary disease) (HCC)    Coronary artery disease    Diverticulosis of colon    Erectile dysfunction    Hemorrhoids, internal    History of compression fracture of spine    History of gout    Hyperlipidemia    Hypertension    Knee pain    Myocardial infarction [facility]) 2005   Peripheral vascular disease ([place])     Past Surgical History:  Procedure Laterality Date   ABDOMINAL AORTIC ENDOVASCULAR STENT GRAFT N/A 04/07/2017   Procedure: ENDOVASCULAR ABDOMINAL ANEURYSM REPAIR;  Surgeon: Serafina Mitchell, MD;  Location: [place];  Service: Vascular;  Laterality: N/A;   CORONARY ANGIOPLASTY  2005   LOOP RECORDER INSERTION N/A 06/07/2021   Procedure: LOOP RECORDER INSERTION;  Surgeon: Constance Haw, MD;  Location: [place] [facility];  Service: Cardiovascular;  Laterality: N/A;     Social History Social History   Tobacco Use    Smoking status: Former    Packs/day: 1.50    Years: 20.00    Pack years: 30.00    Types: Cigarettes    Quit date: 01/22/1982    Years since quitting: 39.4   Smokeless tobacco: Never  Vaping Use   Vaping Use: Never used  Substance Use Topics   Alcohol use: No   Drug use: No    Family History Family History  Problem Relation Age of Onset   Heart disease Father        Heart Disease before age 74   Heart attack Father    Cancer Mother        Colon   Heart disease Mother    Hyperlipidemia Mother    Hypertension Mother    Heart attack Mother    Colon cancer Mother    Cancer Brother        lung cancer    Allergies  Allergies  Allergen Reactions   Doxycycline Rash   Penicillins Rash   Sulfonamide Derivatives Rash     Current Outpatient Medications  Medication Sig Dispense Refill   albuterol (PROVENTIL) (2.5 MG/3ML) 0.083% nebulizer solution Take 2.5 mg by nebulization every 6 (six) hours as needed for wheezing or shortness of breath.     albuterol (VENTOLIN HFA) 108 (90 Base) MCG/ACT inhaler Inhale 1-2 puffs into the lungs every 6 (six) hours  as needed for wheezing or shortness of breath.     allopurinol (ZYLOPRIM) 100 MG tablet Take 100 mg by mouth daily.     aspirin 81 MG tablet Take 81 mg by mouth daily.     atorvastatin (LIPITOR) 80 MG tablet Take 1 tablet (80 mg total) by mouth daily. (Patient taking differently: Take 80 mg by mouth at bedtime.) 90 tablet 3   Cholecalciferol (VITAMIN D) 1000 UNITS capsule Take 1,000 Units by mouth daily.     clopidogrel (PLAVIX) 75 MG tablet Take 1 tablet (75 mg total) by mouth daily. 30 tablet 3   diclofenac (VOLTAREN) 75 MG EC tablet Take 1 tablet by mouth 2 (two) times daily.     metoprolol tartrate (LOPRESSOR) 25 MG tablet Take 25 mg by mouth 2 (two) times daily.      nitroGLYCERIN (NITROSTAT) 0.4 MG SL tablet Place 1 tablet (0.4 mg total) under the tongue every 5 (five) minutes as needed. (Patient taking differently: Place 0.4 mg  under the tongue every 5 (five) minutes as needed for chest pain.) 25 tablet 3   pantoprazole (PROTONIX) 40 MG tablet Take 1 tablet (40 mg total) by mouth daily. 90 tablet 3   tiZANidine (ZANAFLEX) 2 MG tablet Take 2 mg by mouth at bedtime.     No current facility-administered medications for this visit.    ROS:   General:  No weight loss, Fever, chills  HEENT: No recent headaches, no nasal bleeding, no visual changes, no sore throat  Neurologic: No dizziness, blackouts, seizures. No recent symptoms of stroke or mini- stroke. No recent episodes of slurred speech, or temporary blindness.  Cardiac: No recent episodes of chest pain/pressure, no shortness of breath at rest.  No shortness of breath with exertion.  Denies history of atrial fibrillation or irregular heartbeat  Vascular: No history of rest pain in feet.  No history of claudication.  No history of non-healing ulcer, No history of DVT   Pulmonary: No home oxygen, no productive cough, no hemoptysis,  No asthma or wheezing  Musculoskeletal:  '[ ]'$  Arthritis, '[ ]'$  Low back pain,  '[ ]'$  Joint pain  Hematologic:No history of hypercoagulable state.  No history of easy bleeding.  No history of anemia  Gastrointestinal: No hematochezia or melena,  No gastroesophageal reflux, no trouble swallowing  Urinary: '[ ]'$  chronic Kidney disease, '[ ]'$  on HD - '[ ]'$  MWF or '[ ]'$  TTHS, '[ ]'$  Burning with urination, '[ ]'$  Frequent urination, '[ ]'$  Difficulty urinating;   Skin: No rashes  Psychological: No history of anxiety,  No history of depression   Physical Examination  Vitals:   06/12/21 0948  Resp: 18  Weight: 165 lb (74.8 kg)  Height: '5\' 10"'$  (1.778 m)    Body mass index is 23.68 kg/m.  General:  Alert and oriented, no acute distress HEENT: Normal Neck: + left carotid  bruit  Pulmonary: Clear to auscultation bilaterally Cardiac: Regular Rate and Rhythm without murmur Gastrointestinal: Soft, non-tender, non-distended, no mass, no scars Skin:  No rash Extremity Pulses: warm to touch without ischemic changes Musculoskeletal: No deformity or edema  Neurologic: Upper and lower extremity motor 5/5 and symmetric  DATA:      Endovascular Aortic Repair (EVAR):  +----------+----------------+-------------------+-------------------+            Diameter AP (cm)Diameter Trans (cm)Velocities (cm/sec)  +----------+----------------+-------------------+-------------------+  Aorta     4.76            4.41  46                   +----------+----------------+-------------------+-------------------+  Right Limb1.55            1.63               45                   +----------+----------------+-------------------+-------------------+  Left Limb 1.31            1.44               46                   +----------+----------------+-------------------+-------------------+    Summary:  Abdominal Aorta: The largest aortic diameter has decreased compared to  prior exam. Previous diameter measurement was 4.8 x 4.44cm obtained on  06/06/20.      ASSESSMENT/ PLAN: AAA s/p  EVAR repair of symptomatic 5.4 cm AAA by Dr. Trula Slade on 04/07/2017.   He denies abdominal/lumbar pain, no history of claudication, rest pain or non healing wounds. The AAA sac has reduced in diameter and there is no sign of endo leak.  He will stay active and continue take ASA and Statin daily.       Roxy Horseman PA-C [facility] [phone]  MD in clinic [place]

## 2021-06-16 ENCOUNTER — Telehealth: Payer: Self-pay

## 2021-06-16 NOTE — Telephone Encounter (Signed)
Loop Recorder Follow up   Spoke with patient he stated that the steri-strips had not fallen off yet informed him that the strips should fall off in a few more days, patient stated that he thought the site was looking good, patients home monitor updated 06/16/21. Patient voiced understanding to call office for bleeding, drainage from incision site or  fever or chills

## 2021-06-16 NOTE — Telephone Encounter (Signed)
-----   Message from Shirley Friar, [place] sent at 06/07/2021 12:42 PM EDT ----- Loop recorder for CVA - Dr. Curt Bears 06/07/2021

## 2021-06-27 ENCOUNTER — Ambulatory Visit (HOSPITAL_COMMUNITY): Payer: Medicare HMO | Attending: Family Medicine | Admitting: Speech Pathology

## 2021-06-27 ENCOUNTER — Encounter (HOSPITAL_COMMUNITY): Payer: Self-pay | Admitting: Speech Pathology

## 2021-06-27 DIAGNOSIS — R41841 Cognitive communication deficit: Secondary | ICD-10-CM | POA: Diagnosis not present

## 2021-06-27 NOTE — Therapy (Signed)
OUTPATIENT SPEECH LANGUAGE PATHOLOGY EVALUATION   Patient Name: Justin Lyons MRN: [id number] DOB:[birth year], 86 y.o., male Today's Date: 06/27/2021  PCP: Roanna Raider, MD REFERRING PROVIDER: Myrene Buddy, MD   End of Session - 06/27/21 1519     Visit Number 1    Number of Visits [place] 01/0/12023-current no ded oop max $3,400.00/ $20.00 met auth req via cohere no visit limit $10.00 co-pay no co-ins    SLP Start Time 1435    SLP Stop Time  1515    SLP Time Calculation (min) 40 min    Activity Tolerance Patient tolerated treatment well             Past Medical History:  Diagnosis Date   AAA (abdominal aortic aneurysm) ([place])    06/02/2003 old BPG   Arthritis    Carotid artery occlusion    Carotid bruit    COPD (chronic obstructive pulmonary disease) (HCC)    Coronary artery disease    Diverticulosis of colon    Erectile dysfunction    Hemorrhoids, internal    History of compression fracture of spine    History of gout    Hyperlipidemia    Hypertension    Knee pain    Myocardial infarction [facility]) 2005   Peripheral vascular disease ([place])    Past Surgical History:  Procedure Laterality Date   ABDOMINAL AORTIC ENDOVASCULAR STENT GRAFT N/A 04/07/2017   Procedure: ENDOVASCULAR ABDOMINAL ANEURYSM REPAIR;  Surgeon: Serafina Mitchell, MD;  Location: [place];  Service: Vascular;  Laterality: N/A;   CORONARY ANGIOPLASTY  2005   LOOP RECORDER INSERTION N/A 06/07/2021   Procedure: LOOP RECORDER INSERTION;  Surgeon: Constance Haw, MD;  Location: [place] [facility];  Service: Cardiovascular;  Laterality: N/A;   Patient Active Problem List   Diagnosis Date Noted   Acute embolic stroke ([place]) 62/22/9798   Acute viral syndrome 06/03/2020   Influenza-like illness 06/03/2020   Tick bite 06/03/2020   Neoplasm of uncertain behavior of skin 06/11/2017   Dysuria 04/15/2017   AAA (abdominal aortic aneurysm)  ([place]) 04/06/2017   Allergic rhinitis 10/24/2016   Salmonella infection 10/24/2016   Body mass index 25.0-25.9, adult 04/01/2016   Rash and other nonspecific skin eruption 04/01/2016   Wheezing 04/01/2016   Cough 11/16/2015   Need for prophylactic vaccination and inoculation against single disease 10/04/2015   Impacted cerumen 08/01/2015   Prominent popliteal pulse 01/28/2015   Diarrhea 08/08/2014   Fever due to unspecified condition 08/08/2014   Hip, thigh, leg, and ankle, insect bite, nonvenomous 05/20/2014   Routine general medical examination at a health care facility 01/03/2014   Family history of malignant neoplasm of gastrointestinal tract 12/29/2013   Diverticulitis 12/07/2013   Abdominal aneurysm without mention of rupture 12/05/2011   Acute bronchitis 07/12/2010   KNEE PAIN 05/31/2009   CAROTID BRUIT 11/02/2007   ERECTILE DYSFUNCTION 05/02/2007   COPD 05/02/2007   GOUT 04/29/2007   DIVERTICULOSIS OF COLON 04/29/2007   COMPRESSION FRACTURE 04/29/2007   Hyperlipidemia 11/05/2006   Hypertension 11/05/2006   Coronary atherosclerosis 11/05/2006   PERIPHERAL VASCULAR DISEASE 11/05/2006   HEMORRHOIDS, INTERNAL 11/05/2006    ONSET DATE: 06/05/2021   REFERRING DIAG: I63.9 CVA  THERAPY DIAG:  Cognitive communication deficit  Rationale for Evaluation and Treatment Rehabilitation  SUBJECTIVE:   SUBJECTIVE STATEMENT: "I feel like I am doing better." Pt accompanied by: Son  PERTINENT HISTORY: 86yo male admitted 06/05/21 with  AMS including confusion and dizziness. PMH: AAA s/p EVAR, HTN, CAD, arthritis, HLD, MI, PVD. MRI acute ischemic punctate foci within posterior left parietal, left occuipital, and left cerebellum. He was referred by Myrene Buddy, MD (hospitalist during his acute admission at [facility]) for a cognitive linguistic evaluation due to noted cognitive communication changes s/p stroke.   PAIN:  Are you having pain? No   FALLS: Has patient fallen in  last 6 months?  No  LIVING ENVIRONMENT: Lives with: lives alone Lives in: House/apartment  PLOF:  Level of assistance: Independent with ADLs Employment: Retired   PATIENT GOALS "I am doing everything that I was doing before."  OBJECTIVE:   DIAGNOSTIC FINDINGS: SLUMS ([place] Mental Status Examination) 24/30  COGNITION: Overall cognitive status: Within functional limits for tasks assessed and Son and Pt report normal age changes  COGNITIVE COMMUNICATION Following directions: Follows multi-step commands consistently  Auditory comprehension: WFL Verbal expression: WFL Functional communication: WFL  ORAL MOTOR EXAMINATION Facial : Symmetry impaired: WFL Lingual: WFL Velum: WFL Mandible: WFL Cough: WFL Voice: WFL  STANDARDIZED ASSESSMENTS: SLUMS: 3/30   [facility] SLUMS Examination Orientation  3/3  Numeric Problem Solving  1/3  Memory  3/5  Attention 1/2  Thought Organization 2/3  Clock Drawing 4/4  Visuospatial Skills               2/2  Short Story Recall  8/8  Total  24/30     Scoring  High School Education  Less than High School Education   Normal  27-30 25-30  Mild Neurocognitive Disorder 21-26 20-24  Dementia  1-20 1-19    ASSESSMENT:  CLINICAL IMPRESSION: Patient is an 86 y.o. male who was seen today for a cognitive linguistic evaluation. Pt reports having a high school education. He is a retired Engineer, structural. Speech and Language are intact. The [place] Mental Status (SLUMS) Examination was administered today. Pt scored 24/30 (n=27+/30), raising concern for mild neurocognitive disorder. He had difficulty with delayed recall (3 of 5 objects recalled independently), thought organization (12 animals in one minute), and attention (digit reversal). Pt scored 18/30 a few weeks ago during acute admission and he and son feel he is back to his cognitive linguistic baseline. Pt lives alone with check-ins by his son. He has resumed driving,  managing his finances, medications, and house keeping independently. Pt and his son do not feel therapy is indicated at this time and SLP is in agreement with both are satisfied with current status and son is able to check in with his father daily.    PLAN: SLP FREQUENCY: one time visit  SLP DURATION: other: Evaluation only, no treatment recommended at this time     Thank you,  Genene Churn, [place]  [place], [place] 06/27/2021, 3:21 PM

## 2021-07-07 DIAGNOSIS — Z6824 Body mass index (BMI) 24.0-24.9, adult: Secondary | ICD-10-CM | POA: Diagnosis not present

## 2021-07-07 DIAGNOSIS — I639 Cerebral infarction, unspecified: Secondary | ICD-10-CM | POA: Diagnosis not present

## 2021-07-07 DIAGNOSIS — I2510 Atherosclerotic heart disease of native coronary artery without angina pectoris: Secondary | ICD-10-CM | POA: Diagnosis not present

## 2021-07-07 DIAGNOSIS — M542 Cervicalgia: Secondary | ICD-10-CM | POA: Diagnosis not present

## 2021-07-11 DIAGNOSIS — M47812 Spondylosis without myelopathy or radiculopathy, cervical region: Secondary | ICD-10-CM | POA: Diagnosis not present

## 2021-07-19 ENCOUNTER — Ambulatory Visit (INDEPENDENT_AMBULATORY_CARE_PROVIDER_SITE_OTHER): Payer: Medicare HMO

## 2021-07-19 DIAGNOSIS — I639 Cerebral infarction, unspecified: Secondary | ICD-10-CM | POA: Diagnosis not present

## 2021-07-19 DIAGNOSIS — Z4589 Encounter for adjustment and management of other implanted devices: Secondary | ICD-10-CM

## 2021-07-19 DIAGNOSIS — I2510 Atherosclerotic heart disease of native coronary artery without angina pectoris: Secondary | ICD-10-CM | POA: Diagnosis not present

## 2021-07-20 LAB — CUP PACEART REMOTE DEVICE CHECK
Date Time Interrogation Session: 20230627095050
Implantable Pulse Generator Implant Date: 20230517

## 2021-08-06 ENCOUNTER — Encounter: Payer: Self-pay | Admitting: Cardiology

## 2021-08-07 NOTE — Telephone Encounter (Signed)
In general diclofenac and other NSAIDs are not recommended in patients with coronary artery disease. They can increase the risk of recurrent heart attacks and also can increase the risk of stomach bleeding while on plavix. We would look to avoid, however if he is just in chronic unrelenting pain and understands the risk and nothing else seems to help then its a personal decision for him to make based on the risks I have described   Zandra Abts MD

## 2021-08-08 ENCOUNTER — Inpatient Hospital Stay: Payer: Medicare HMO | Admitting: Family Medicine

## 2021-08-08 NOTE — Progress Notes (Signed)
Carelink Summary Report / Loop Recorder 

## 2021-08-17 ENCOUNTER — Ambulatory Visit (INDEPENDENT_AMBULATORY_CARE_PROVIDER_SITE_OTHER): Payer: Medicare HMO

## 2021-08-17 DIAGNOSIS — I2510 Atherosclerotic heart disease of native coronary artery without angina pectoris: Secondary | ICD-10-CM | POA: Diagnosis not present

## 2021-08-21 LAB — CUP PACEART REMOTE DEVICE CHECK
Date Time Interrogation Session: 20230730094958
Implantable Pulse Generator Implant Date: 20230517

## 2021-08-23 DIAGNOSIS — M542 Cervicalgia: Secondary | ICD-10-CM | POA: Diagnosis not present

## 2021-08-23 DIAGNOSIS — M539 Dorsopathy, unspecified: Secondary | ICD-10-CM | POA: Diagnosis not present

## 2021-08-23 DIAGNOSIS — Z6824 Body mass index (BMI) 24.0-24.9, adult: Secondary | ICD-10-CM | POA: Diagnosis not present

## 2021-08-23 DIAGNOSIS — I2510 Atherosclerotic heart disease of native coronary artery without angina pectoris: Secondary | ICD-10-CM | POA: Diagnosis not present

## 2021-08-23 DIAGNOSIS — R030 Elevated blood-pressure reading, without diagnosis of hypertension: Secondary | ICD-10-CM | POA: Diagnosis not present

## 2021-08-23 DIAGNOSIS — I639 Cerebral infarction, unspecified: Secondary | ICD-10-CM | POA: Diagnosis not present

## 2021-08-29 DIAGNOSIS — M47812 Spondylosis without myelopathy or radiculopathy, cervical region: Secondary | ICD-10-CM | POA: Diagnosis not present

## 2021-09-08 NOTE — Progress Notes (Signed)
Carelink Summary Report / Loop Recorder 

## 2021-09-13 DIAGNOSIS — L821 Other seborrheic keratosis: Secondary | ICD-10-CM | POA: Diagnosis not present

## 2021-09-13 DIAGNOSIS — D1801 Hemangioma of skin and subcutaneous tissue: Secondary | ICD-10-CM | POA: Diagnosis not present

## 2021-09-13 DIAGNOSIS — D692 Other nonthrombocytopenic purpura: Secondary | ICD-10-CM | POA: Diagnosis not present

## 2021-09-13 DIAGNOSIS — L570 Actinic keratosis: Secondary | ICD-10-CM | POA: Diagnosis not present

## 2021-09-13 DIAGNOSIS — Z85828 Personal history of other malignant neoplasm of skin: Secondary | ICD-10-CM | POA: Diagnosis not present

## 2021-09-19 ENCOUNTER — Ambulatory Visit (INDEPENDENT_AMBULATORY_CARE_PROVIDER_SITE_OTHER): Payer: Medicare HMO

## 2021-09-19 DIAGNOSIS — I639 Cerebral infarction, unspecified: Secondary | ICD-10-CM

## 2021-09-21 DIAGNOSIS — J441 Chronic obstructive pulmonary disease with (acute) exacerbation: Secondary | ICD-10-CM | POA: Diagnosis not present

## 2021-09-21 DIAGNOSIS — K219 Gastro-esophageal reflux disease without esophagitis: Secondary | ICD-10-CM | POA: Diagnosis not present

## 2021-09-24 LAB — CUP PACEART REMOTE DEVICE CHECK
Date Time Interrogation Session: 20230901094832
Implantable Pulse Generator Implant Date: 20230517

## 2021-10-04 ENCOUNTER — Telehealth: Payer: Self-pay | Admitting: *Deleted

## 2021-10-04 NOTE — Patient Outreach (Signed)
  Care Coordination   10/04/2021 Name: Justin Lyons MRN: [id number] DOB: Dec 01, 1935   Care Coordination Outreach Attempts:  An unsuccessful telephone outreach was attempted today to offer the patient information about available care coordination services as a benefit of their health plan.   Follow Up Plan:  Additional outreach attempts will be made to offer the patient care coordination information and services.   Encounter Outcome:  No Answer  Care Coordination Interventions Activated:  No   Care Coordination Interventions:  No, not indicated    Valente David, RN, MSN, [facility] Care Management Care Management Coordinator [phone]

## 2021-10-10 ENCOUNTER — Telehealth: Payer: Self-pay | Admitting: *Deleted

## 2021-10-10 NOTE — Patient Outreach (Signed)
  Care Coordination   10/10/2021 Name: Justin Lyons MRN: [id number] DOB: 1935-09-22   Care Coordination Outreach Attempts:  A second unsuccessful outreach was attempted today to offer the patient with information about available care coordination services as a benefit of their health plan.     Follow Up Plan:  Additional outreach attempts will be made to offer the patient care coordination information and services.   Encounter Outcome:  No Answer  Care Coordination Interventions Activated:  No   Care Coordination Interventions:  No, not indicated    Valente David, RN, MSN, [facility] Care Management Care Management Coordinator [phone]

## 2021-10-12 ENCOUNTER — Encounter: Payer: Self-pay | Admitting: *Deleted

## 2021-10-12 ENCOUNTER — Telehealth: Payer: Self-pay | Admitting: *Deleted

## 2021-10-12 NOTE — Patient Outreach (Signed)
  Care Coordination   Initial Visit Note   10/12/2021 Name: CADEN FUKUSHIMA MRN: [id number] DOB: [birth year]  Frederick Peers is a 86 y.o. year old male who sees Sasser, Silvestre Moment, MD for primary care. I spoke with  Frederick Peers by phone today.  What matters to the patients health and wellness today?  Report he is doing well managing his health.  Denies need for further follow up at this time.  Denies any urgent concerns, encouraged to contact this care manager with questions.      Goals Addressed             This Visit's Progress    COMPLETED: Care Coordination Activities - No follow up needed       Care Coordination Interventions: Patient interviewed about adult health maintenance status including  Colonoscopy    Falls risk assessment    Regular eye checkups Regular Dental Care    Blood Pressure    Advised patient to discuss  Pneumonia Vaccine Influenza Vaccine COVID vaccination    with primary care provider  Provided education about need for yearly AWV, next scheduled for 11/12 SDOH assessment complete         SDOH assessments and interventions completed:  Yes  SDOH Interventions Today    Flowsheet Row Most Recent Value  SDOH Interventions   Food Insecurity Interventions Intervention Not Indicated  Housing Interventions Intervention Not Indicated  Transportation Interventions Intervention Not Indicated  Utilities Interventions Intervention Not Indicated        Care Coordination Interventions Activated:  Yes  Care Coordination Interventions:  Yes, provided   Follow up plan: No further intervention required.   Encounter Outcome:  Pt. Visit Completed   Valente David, RN, MSN, [place] Care Management Care Management Coordinator [phone]

## 2021-10-12 NOTE — Patient Instructions (Signed)
Visit Information  Thank you for taking time to visit with me today. Please don't hesitate to contact me if I can be of assistance to you.  Following are the goals we discussed today:  Continue regular exercise routine and daily BP monitoring.   Please call the Suicide and Crisis Lifeline: 988 call the [place] National Suicide Prevention Lifeline: 8388839731 or TTY: 959-254-2243 TTY (615) 067-6779) to talk to a trained counselor call 1-800-273-TALK (toll free, 24 hour hotline) call the [facility]: [phone] call 911 if you are experiencing a Mental Health or [place] or need someone to talk to.  Patient verbalizes understanding of instructions and care plan provided today and agrees to view in [place]. Active MyChart status and patient understanding of how to access instructions and care plan via MyChart confirmed with patient.     The patient has been provided with contact information for the care management team and has been advised to call with any health related questions or concerns.   Valente David, RN, MSN, [place] Care Management Care Management Coordinator [phone]

## 2021-10-13 ENCOUNTER — Encounter: Payer: Self-pay | Admitting: Cardiology

## 2021-10-13 ENCOUNTER — Ambulatory Visit: Payer: Medicare HMO | Attending: Cardiology | Admitting: Cardiology

## 2021-10-13 VITALS — BP 132/80 | HR 72 | Ht 70.0 in | Wt 162.4 lb

## 2021-10-13 DIAGNOSIS — I2510 Atherosclerotic heart disease of native coronary artery without angina pectoris: Secondary | ICD-10-CM

## 2021-10-13 DIAGNOSIS — I10 Essential (primary) hypertension: Secondary | ICD-10-CM | POA: Diagnosis not present

## 2021-10-13 DIAGNOSIS — E782 Mixed hyperlipidemia: Secondary | ICD-10-CM

## 2021-10-13 NOTE — Patient Instructions (Addendum)
Medication Instructions:  Continue all current medications.   Labwork: none  Testing/Procedures: none  Follow-Up: 6 months   Any Other Special Instructions Will Be Listed Below (If Applicable).   If you need a refill on your cardiac medications before your next appointment, please call your pharmacy.  

## 2021-10-13 NOTE — Progress Notes (Signed)
Clinical Summary Justin Lyons is a 86 y.o.male seen today for follow up of the following medical problems.    1. CAD - remote history of caths somewhat unclear. From available clinic notes DES to LCX in 2005. Notes mention prior to that a remote history of PTCA to RCA     - no chest pains, no SOB/DOE - compliant with meds   2. AAA - s/p endovascular repair - followed by vascular, last visit 05/2021   3. Carotid stenosis with left subclavian stenosis - 11/2019 [place]  mild bilaterally.  - bp's need to be checked in right arm     4. COPD - managed by pcp   5. HTN - had some low bp's, pcp lowered lopressor to 12.'5mg'$  bid - due to left subclavian stenosis, bp's must be measured in right arm   - compliant with meds    6. Hyperlipidemia Jan 2020 TC 140 TG 135 HDL 35 LDL 78 - labs followed by pcp    05/2021 TC 98 TG 63 HDL 28 LDL 57    7. CVA 05/2021 - loop recorder followed by EP - on plavix, off ASA Past Medical History:  Diagnosis Date   AAA (abdominal aortic aneurysm) ([place])    06/02/2003 old BPG   Arthritis    Carotid artery occlusion    Carotid bruit    COPD (chronic obstructive pulmonary disease) (HCC)    Coronary artery disease    Diverticulosis of colon    Erectile dysfunction    Hemorrhoids, internal    History of compression fracture of spine    History of gout    Hyperlipidemia    Hypertension    Knee pain    Myocardial infarction [address] [facility]) 2005   Peripheral vascular disease (HCC)      Allergies  Allergen Reactions   Doxycycline Rash   Penicillins Rash   Sulfonamide Derivatives Rash     Current Outpatient Medications  Medication Sig Dispense Refill   albuterol (PROVENTIL) (2.5 MG/3ML) 0.083% nebulizer solution Take 2.5 mg by nebulization every 6 (six) hours as needed for wheezing or shortness of breath.     albuterol (VENTOLIN HFA) 108 (90 Base) MCG/ACT inhaler Inhale 1-2 puffs into the lungs every 6 (six) hours as needed for wheezing or shortness  of breath.     allopurinol (ZYLOPRIM) 100 MG tablet Take 100 mg by mouth daily.     aspirin 81 MG tablet Take 81 mg by mouth daily.     atorvastatin (LIPITOR) 80 MG tablet Take 1 tablet (80 mg total) by mouth daily. (Patient taking differently: Take 80 mg by mouth at bedtime.) 90 tablet 3   Cholecalciferol (VITAMIN D) 1000 UNITS capsule Take 1,000 Units by mouth daily.     clopidogrel (PLAVIX) 75 MG tablet Take 1 tablet (75 mg total) by mouth daily. 30 tablet 3   diclofenac (VOLTAREN) 75 MG EC tablet Take 1 tablet by mouth 2 (two) times daily.     metoprolol tartrate (LOPRESSOR) 25 MG tablet Take 25 mg by mouth 2 (two) times daily.      nitroGLYCERIN (NITROSTAT) 0.4 MG SL tablet Place 1 tablet (0.4 mg total) under the tongue every 5 (five) minutes as needed. (Patient taking differently: Place 0.4 mg under the tongue every 5 (five) minutes as needed for chest pain.) 25 tablet 3   pantoprazole (PROTONIX) 40 MG tablet Take 1 tablet (40 mg total) by mouth daily. 90 tablet 3   tiZANidine (ZANAFLEX) 2  MG tablet Take 2 mg by mouth at bedtime.     No current facility-administered medications for this visit.     Past Surgical History:  Procedure Laterality Date   ABDOMINAL AORTIC ENDOVASCULAR STENT GRAFT N/A 04/07/2017   Procedure: ENDOVASCULAR ABDOMINAL ANEURYSM REPAIR;  Surgeon: Serafina Mitchell, MD;  Location: [facility];  Service: Vascular;  Laterality: N/A;   CORONARY ANGIOPLASTY  2005   LOOP RECORDER INSERTION N/A 06/07/2021   Procedure: LOOP RECORDER INSERTION;  Surgeon: Constance Haw, MD;  Location: [facility];  Service: Cardiovascular;  Laterality: N/A;     Allergies  Allergen Reactions   Doxycycline Rash   Penicillins Rash   Sulfonamide Derivatives Rash      Family History  Problem Relation Age of Onset   Heart disease Father        Heart Disease before age 40   Heart attack Father    Cancer Mother        Colon   Heart disease Mother    Hyperlipidemia Mother     Hypertension Mother    Heart attack Mother    Colon cancer Mother    Cancer Brother        lung cancer     Social History Justin Lyons reports that he quit smoking about 39 years ago. His smoking use included cigarettes. He has a 30.00 pack-year smoking history. He has never used smokeless tobacco. Justin Lyons reports no history of alcohol use.   Review of Systems CONSTITUTIONAL: No weight loss, fever, chills, weakness or fatigue.  HEENT: Eyes: No visual loss, blurred vision, double vision or yellow sclerae.No hearing loss, sneezing, congestion, runny nose or sore throat.  SKIN: No rash or itching.  CARDIOVASCULAR: per hpi RESPIRATORY: No shortness of breath, cough or sputum.  GASTROINTESTINAL: No anorexia, nausea, vomiting or diarrhea. No abdominal pain or blood.  GENITOURINARY: No burning on urination, no polyuria NEUROLOGICAL: No headache, dizziness, syncope, paralysis, ataxia, numbness or tingling in the extremities. No change in bowel or bladder control.  MUSCULOSKELETAL: No muscle, back pain, joint pain or stiffness.  LYMPHATICS: No enlarged nodes. No history of splenectomy.  PSYCHIATRIC: No history of depression or anxiety.  ENDOCRINOLOGIC: No reports of sweating, cold or heat intolerance. No polyuria or polydipsia.  [name]   Physical Examination Today's Vitals   10/13/21 1004  BP: 132/80  Pulse: 72  SpO2: 100%  Weight: 162 lb 6.4 oz (73.7 kg)  Height: '5\' 10"'$  (1.778 m)   Body mass index is 23.3 kg/m.  Gen: resting comfortably, no acute distress HEENT: no scleral icterus, pupils equal round and reactive, no palptable cervical adenopathy,  CV: RRR, no m/r/g, no jvd Resp: Clear to auscultation bilaterally GI: abdomen is soft, non-tender, non-distended, normal bowel sounds, no hepatosplenomegaly MSK: extremities are warm, no edema.  Skin: warm, no rash Neuro:  no focal deficits Psych: appropriate affect   Diagnostic Studies  08/2003 cath CLINICAL HISTORY:  Justin Lyons  is 86 years old and had a remote PTCA of the  right coronary artery by Dr. Shelva Majestic and was admitted yesterday with  chest pain and minimal ST elevation in the inferior leads.  He was studied  by Dr. Ethelle Lyon and found to have a chronically totally occluded  right coronary artery which was small, and 80% to 90% stenosis near the  ostium of the circumflex artery with 70% to 80% ostial stenosis of the first  marginal Marylynn Rigdon.  Dr. Albertine Patricia stented the proximal circumflex  artery with a  TAXUS stent, feeling this was the culprit vessel.  The patient developed  some recurrent chest discomfort last night and today, and for this reason  was brought back to the laboratory for reevaluation.  His EKG showed slight  inferior ST elevation.    PROCEDURE:  The procedure was performed via the left femoral artery using  arterial sheath and a left diagnostic catheter.  Femoral arterial puncture  was performed and Omnipaque contrast was used.  The left femoral artery was  closed with AngioSeal at the end of the procedure.  The patient tolerated  the procedure well and left the laboratory in satisfactory condition.    RESULTS:  The stent in the proximal circumflex artery which crossed the  first marginal Shamicka Inga was widely patent with no stenosis.  The first  marginal Caulin Begley was narrowed at the ostium to 70% to 80%.  This appeared  better than on the immediate post-stenting film from yesterday.  There was  also a 30% to 40% narrowing downstream in the circumflex artery.    CONCLUSION:  Widely patent stent in the proximal circumflex artery with 80%  narrowing in a side Vaniya Augspurger, but with brisk TIMI-3 flow.    RECOMMENDATIONS:  The stent is widely patent and the side Elina Streng has brisk  TIMI flow and the ostial stenosis appears better than the post-procedure  films.  I think it is very unlikely that the patient's symptoms are related  to the marginal Adella Manolis narrowing.  His symptoms may be related to  his Plavix  and we will plan to treat him with Pepcid and continued observation.     01/9377 cath COMPLICATIONS:  None.    FINDINGS:  1. LV 114/5/13.  EF approximately 60% without regional wall motion     abnormality in an RAO projection.  2. No aortic stenosis or mitral regurgitation.  3. Left main:  Angiographically normal.  4. LAD:  Large vessel wrapping the apex.  There is a 50% stenosis of the     proximal vessel.  5. Circumflex:  Hazy, approximately 80% stenosis of the proximal vessel     involving the takeoff of moderate sized first obtuse marginal.  This     first marginal had an 80% stenosis.  The AV groove circumflex was stented     to no residual stenosis.  There remained a 70% stenosis in the obtuse     marginal.  TIMI 3 flow was maintained in both the circumflex proper and     the first obtuse marginal.  6. RCA:  Moderate sized, dominant vessel.  There is a total occlusion of the     mid vessel after the takeoff of the acute marginal.  There are bridging     collaterals and modest left to right collaterals.  The bridging     collaterals are very suggestive of chronic total occlusion.  Films are     reviewed with Dr. Olevia Perches who agreed.    IMPRESSION/RECOMMENDATIONS:  Successful drug-eluting stent placement in the  culprit lesion of the proximal circumflex.  The patient tolerated the  procedure well.  Eptifibatide will be continued for 18 hours.  Sheaths will  be removed when the ACT is less than 175 seconds.    The patient has agreed to participate in the TRITON study comparing Plavix  with a novel thienopyridine.  He will be continued on this study drug for  approximately one year.  He, thus, should not receive any Plavix through  that period.  Aspirin will be continued indefinitely.   11/2019 carotid US Summary:  Right Carotid: Velocities in the right ICA are consistent with a 1-39%  stenosis.   Left Carotid: Velocities in the left ICA are consistent with a 1-39%   stenosis.   Vertebrals:  Right vertebral artery demonstrates antegrade flow. Left  vertebral               artery demonstrates retrograde flow.  Subclavians: Left subclavian artery was stenotic. Normal flow hemodynamics  were               seen in the right subclavian artery.   05/2021 echo 1. Left ventricular ejection fraction, by estimation, is 60 to 65%. The  left ventricle has normal function. The left ventricle has no regional  wall motion abnormalities. There is mild left ventricular hypertrophy of  the basal-septal segment. Left  ventricular diastolic parameters are consistent with Grade I diastolic  dysfunction (impaired relaxation).   2. Right ventricular systolic function is normal. The right ventricular  size is normal.   3. The mitral valve is normal in structure. Mild mitral valve  regurgitation. No evidence of mitral stenosis.   4. The aortic valve is tricuspid. Aortic valve regurgitation is trivial.  No aortic stenosis is present.   Assessment and Plan   1. CAD -no recent symptoms, continue current meds     2. HTN -at goal, continue current meds   3. Hyperlipidemia - at goal, continue current meds     Arnoldo Lenis, M.D.,

## 2021-10-13 NOTE — Addendum Note (Signed)
Addended by: Laurine Blazer on: 10/13/2021 10:42 AM   Modules accepted: Orders

## 2021-10-13 NOTE — Progress Notes (Signed)
Carelink Summary Report / Loop Recorder 

## 2021-10-16 ENCOUNTER — Telehealth: Payer: Self-pay

## 2021-10-16 NOTE — Telephone Encounter (Signed)
LINQ alert received.  1 new AF event 9/24, duration 2hrs 44mn, mean HR 81. Burden 0.1%, no OAC Route to triage.  Confirmed EGM's with AOda Kilts PA-C. Agrees AF noted and AF clinic referral appropriate. Attempted to contact patient to advise. No answer, LMTCB.

## 2021-10-17 ENCOUNTER — Telehealth: Payer: Self-pay | Admitting: Student

## 2021-10-17 NOTE — Telephone Encounter (Signed)
Pt returned call to my desk.   I discussed findings of atrial fibrillation on his loop recorder and need for in person visit to discuss and change his anticoagulation regiment.  He is agreeable to call for scheduling from AF office.   Legrand Como [address] [place], PA-C  10/17/2021 9:45 AM

## 2021-10-18 ENCOUNTER — Encounter (HOSPITAL_COMMUNITY): Payer: Self-pay | Admitting: Physician Assistant

## 2021-10-18 ENCOUNTER — Ambulatory Visit (HOSPITAL_COMMUNITY)
Admission: RE | Admit: 2021-10-18 | Discharge: 2021-10-18 | Disposition: A | Discharge status: Home / Self Care | Payer: Medicare HMO | Source: Ambulatory Visit | Attending: Physician Assistant | Admitting: Physician Assistant

## 2021-10-18 VITALS — BP 152/80 | HR 62 | Ht 70.0 in | Wt 164.4 lb

## 2021-10-18 DIAGNOSIS — I2510 Atherosclerotic heart disease of native coronary artery without angina pectoris: Secondary | ICD-10-CM | POA: Insufficient documentation

## 2021-10-18 DIAGNOSIS — Z7901 Long term (current) use of anticoagulants: Secondary | ICD-10-CM | POA: Insufficient documentation

## 2021-10-18 DIAGNOSIS — Z8673 Personal history of transient ischemic attack (TIA), and cerebral infarction without residual deficits: Secondary | ICD-10-CM | POA: Diagnosis not present

## 2021-10-18 DIAGNOSIS — D6869 Other thrombophilia: Secondary | ICD-10-CM | POA: Diagnosis not present

## 2021-10-18 DIAGNOSIS — J449 Chronic obstructive pulmonary disease, unspecified: Secondary | ICD-10-CM | POA: Insufficient documentation

## 2021-10-18 DIAGNOSIS — I480 Paroxysmal atrial fibrillation: Secondary | ICD-10-CM | POA: Diagnosis not present

## 2021-10-18 DIAGNOSIS — I10 Essential (primary) hypertension: Secondary | ICD-10-CM | POA: Insufficient documentation

## 2021-10-18 DIAGNOSIS — Z7982 Long term (current) use of aspirin: Secondary | ICD-10-CM | POA: Insufficient documentation

## 2021-10-18 MED ORDER — APIXABAN 5 MG PO TABS: 5.0000 mg | ORAL_TABLET | Freq: Two times a day (BID) | ORAL | 1 refills | Status: DC

## 2021-10-18 MED ORDER — ASPIRIN 81 MG PO TBEC: 81.0000 mg | DELAYED_RELEASE_TABLET | Freq: Every day | ORAL | 2 refills | Status: DC

## 2021-10-18 NOTE — Progress Notes (Signed)
Primary Care Physician: Manon Hilding, MD Primary Cardiologist: Dr Carlyle Dolly Primary Electrophysiologist: Dr Curt Bears Referring Physician: Device Clinic/Andy Tillery PA   Justin Lyons is a 86 y.o. male with a history of CAD, AAA s/p endovascular repair, carotid artery disease, COPD, HTN, HLD, CVA, atrial fibrillation who presents for follow up in the [facility].  The patient was admitted 05/2021 with a cryptogenic stroke and an ILR was placed at that time. The device clinic received an alert for an afib episode on 10/15/21 which lasted close to 3 hours. Patient has a CHADS2VASC score of 6. He was unaware of his arrhythmia.   Today, he denies symptoms of palpitations, chest pain, shortness of breath, orthopnea, PND, lower extremity edema, dizziness, presyncope, syncope, snoring, daytime somnolence, bleeding, or neurologic sequela. The patient is tolerating medications without difficulties and is otherwise without complaint today.    Atrial Fibrillation Risk Factors:  he does not have symptoms or diagnosis of sleep apnea. he does not have a history of rheumatic fever.   he has a BMI of Body mass index is 23.59 kg/m.[name] Filed Weights   10/18/21 0909  Weight: 74.6 kg    Family History  Problem Relation Age of Onset   Heart disease Father        Heart Disease before age 27   Heart attack Father    Cancer Mother        Colon   Heart disease Mother    Hyperlipidemia Mother    Hypertension Mother    Heart attack Mother    Colon cancer Mother    Cancer Brother        lung cancer     Atrial Fibrillation Management history:  Previous antiarrhythmic drugs: none Previous cardioversions: none Previous ablations: none CHADS2VASC score: 6 Anticoagulation history: none   Past Medical History:  Diagnosis Date   AAA (abdominal aortic aneurysm) ([place])    06/02/2003 old BPG   Arthritis    Carotid artery occlusion    Carotid bruit    COPD (chronic  obstructive pulmonary disease) (HCC)    Coronary artery disease    Diverticulosis of colon    Erectile dysfunction    Hemorrhoids, internal    History of compression fracture of spine    History of gout    Hyperlipidemia    Hypertension    Knee pain    Myocardial infarction [facility]) 2005   Peripheral vascular disease ([place])    Past Surgical History:  Procedure Laterality Date   ABDOMINAL AORTIC ENDOVASCULAR STENT GRAFT N/A 04/07/2017   Procedure: ENDOVASCULAR ABDOMINAL ANEURYSM REPAIR;  Surgeon: Serafina Mitchell, MD;  Location: [facility];  Service: Vascular;  Laterality: N/A;   CORONARY ANGIOPLASTY  2005   LOOP RECORDER INSERTION N/A 06/07/2021   Procedure: LOOP RECORDER INSERTION;  Surgeon: Constance Haw, MD;  Location: [facility];  Service: Cardiovascular;  Laterality: N/A;    Current Outpatient Medications  Medication Sig Dispense Refill   albuterol (PROVENTIL) (2.5 MG/3ML) 0.083% nebulizer solution Take 2.5 mg by nebulization every 6 (six) hours as needed for wheezing or shortness of breath.     albuterol (VENTOLIN HFA) 108 (90 Base) MCG/ACT inhaler Inhale 1-2 puffs into the lungs every 6 (six) hours as needed for wheezing or shortness of breath.     allopurinol (ZYLOPRIM) 100 MG tablet Take 100 mg by mouth daily.     atorvastatin (LIPITOR) 80 MG tablet Take 1 tablet (80 mg total)  by mouth daily. 90 tablet 3   Cholecalciferol (VITAMIN D) 1000 UNITS capsule Take 1,000 Units by mouth daily.     clopidogrel (PLAVIX) 75 MG tablet Take 1 tablet (75 mg total) by mouth daily. 30 tablet 3   diclofenac (VOLTAREN) 75 MG EC tablet Take 1 tablet by mouth daily.     metoprolol tartrate (LOPRESSOR) 25 MG tablet Take 25 mg by mouth 2 (two) times daily.      nitroGLYCERIN (NITROSTAT) 0.4 MG SL tablet Place 1 tablet (0.4 mg total) under the tongue every 5 (five) minutes as needed. 25 tablet 3   pantoprazole (PROTONIX) 40 MG tablet Take 1 tablet (40 mg total) by mouth daily. 90 tablet 3    No current facility-administered medications for this encounter.    Allergies  Allergen Reactions   Doxycycline Rash   Penicillins Rash   Sulfonamide Derivatives Rash    Social History   Socioeconomic History   Marital status: Single    Spouse name: Not on file   Number of children: Not on file   Years of education: Not on file   Highest education level: Not on file  Occupational History   Occupation: part time in a pharmacy    Employer: RETIRED  Tobacco Use   Smoking status: Former    Packs/day: 1.50    Years: 20.00    Total pack years: 30.00    Types: Cigarettes    Quit date: 01/22/1982    Years since quitting: 39.7   Smokeless tobacco: Never   Tobacco comments:    Former smoker 10/18/21  Vaping Use   Vaping Use: Never used  Substance and Sexual Activity   Alcohol use: No   Drug use: No   Sexual activity: Not on file  Other Topics Concern   Not on file  Social History Narrative   Not on file   Social Determinants of Health   Financial Resource Strain: Not on file  Food Insecurity: No Food Insecurity (10/12/2021)   Hunger Vital Sign    Worried About Running Out of Food in the Last Year: Never true    Ran Out of Food in the Last Year: Never true  Transportation Needs: No Transportation Needs (10/12/2021)   PRAPARE - Hydrologist (Medical): No    Lack of Transportation (Non-Medical): No  Physical Activity: Not on file  Stress: Not on file  Social Connections: Not on file  Intimate Partner Violence: Not on file     ROS- All systems are reviewed and negative except as per the HPI above.  Physical Exam: Vitals:   10/18/21 0909  BP: (!) 152/80  Pulse: 62  Weight: 74.6 kg  Height: '5\' 10"'$  (1.778 m)    GEN- The patient is a well appearing elderly male, alert and oriented x 3 today.   Head- normocephalic, atraumatic Eyes-  Sclera clear, conjunctiva pink Ears- hearing intact Oropharynx- clear Neck- supple  Lungs- Clear to  ausculation bilaterally, normal work of breathing Heart- Regular rate and rhythm, no murmurs, rubs or gallops  GI- soft, NT, ND, + BS Extremities- no clubbing, cyanosis, or edema MS- no significant deformity or atrophy Skin- no rash or lesion Psych- euthymic mood, full affect Neuro- strength and sensation are intact  Wt Readings from Last 3 Encounters:  10/18/21 74.6 kg  10/13/21 73.7 kg  06/12/21 74.8 kg    EKG today demonstrates  SR Vent. rate 62 BPM PR interval 190 ms QRS duration 76 ms  QT/QTcB 408/414 ms  Echo 06/06/21 demonstrated   1. Left ventricular ejection fraction, by estimation, is 60 to 65%. The  left ventricle has normal function. The left ventricle has no regional  wall motion abnormalities. There is mild left ventricular hypertrophy of  the basal-septal segment. Left ventricular diastolic parameters are consistent with Grade I diastolic dysfunction (impaired relaxation).   2. Right ventricular systolic function is normal. The right ventricular  size is normal.   3. The mitral valve is normal in structure. Mild mitral valve  regurgitation. No evidence of mitral stenosis.   4. The aortic valve is tricuspid. Aortic valve regurgitation is trivial.  No aortic stenosis is present.   Conclusion(s)/Recommendation(s): No intracardiac source of embolism detected on this transthoracic study. Consider a transesophageal echocardiogram to exclude cardiac source of embolism if clinically indicated.   Epic records are reviewed at length today  CHA2DS2-VASc Score = 6  The patient's score is based upon: CHF History: 0 HTN History: 1 Diabetes History: 0 Stroke History: 2 Vascular Disease History: 1 Age Score: 2 Gender Score: 0       ASSESSMENT AND PLAN: 1. Paroxysmal Atrial Fibrillation (ICD10:  I48.0) The patient's CHA2DS2-VASc score is 6, indicating a 9.7% annual risk of stroke.   General education about afib provided and questions answered. We also discussed his  stroke risk and the risks and benefits of anticoagulation. Start Eliquis 5 mg BID. Will change Plavix back to ASA 81 mg daily with his h/o DES.  Continue Lopressor 25 mg BID  2. Secondary Hypercoagulable State (ICD10:  D68.69) The patient is at significant risk for stroke/thromboembolism based upon his CHA2DS2-VASc Score of 6.  Start Apixaban (Eliquis).   3. CAD No anginal symptoms.  4. HTN Mildly elevated today, patient was anxious about his appointment today. Will reassess on follow up.    Follow up in the AF clinic in one month.   [facility] [address] [place],  [address] [phone] 10/18/2021 9:17 AM

## 2021-10-18 NOTE — Patient Instructions (Signed)
Stop plavix  Start aspirin '81mg'$  once a day   Start Eliquis '5mg'$  twice a day

## 2021-10-21 DIAGNOSIS — I10 Essential (primary) hypertension: Secondary | ICD-10-CM | POA: Diagnosis not present

## 2021-10-21 DIAGNOSIS — J441 Chronic obstructive pulmonary disease with (acute) exacerbation: Secondary | ICD-10-CM | POA: Diagnosis not present

## 2021-10-21 DIAGNOSIS — K219 Gastro-esophageal reflux disease without esophagitis: Secondary | ICD-10-CM | POA: Diagnosis not present

## 2021-10-23 ENCOUNTER — Ambulatory Visit (INDEPENDENT_AMBULATORY_CARE_PROVIDER_SITE_OTHER): Payer: Medicare HMO

## 2021-10-23 DIAGNOSIS — I639 Cerebral infarction, unspecified: Secondary | ICD-10-CM

## 2021-10-25 LAB — CUP PACEART REMOTE DEVICE CHECK
Date Time Interrogation Session: 20231004094853
Implantable Pulse Generator Implant Date: 20230517

## 2021-11-06 NOTE — Progress Notes (Signed)
Carelink Summary Report / Loop Recorder 

## 2021-11-16 ENCOUNTER — Ambulatory Visit (HOSPITAL_COMMUNITY)
Admission: RE | Admit: 2021-11-16 | Discharge: 2021-11-16 | Disposition: A | Discharge status: Home / Self Care | Payer: Medicare HMO | Source: Ambulatory Visit | Attending: Physician Assistant | Admitting: Physician Assistant

## 2021-11-16 ENCOUNTER — Encounter (HOSPITAL_COMMUNITY): Payer: Self-pay | Admitting: Physician Assistant

## 2021-11-16 VITALS — BP 148/68 | HR 67 | Ht 70.0 in | Wt 162.2 lb

## 2021-11-16 DIAGNOSIS — I480 Paroxysmal atrial fibrillation: Secondary | ICD-10-CM | POA: Diagnosis not present

## 2021-11-16 DIAGNOSIS — Z7901 Long term (current) use of anticoagulants: Secondary | ICD-10-CM | POA: Insufficient documentation

## 2021-11-16 DIAGNOSIS — E785 Hyperlipidemia, unspecified: Secondary | ICD-10-CM | POA: Insufficient documentation

## 2021-11-16 DIAGNOSIS — J449 Chronic obstructive pulmonary disease, unspecified: Secondary | ICD-10-CM | POA: Diagnosis not present

## 2021-11-16 DIAGNOSIS — I639 Cerebral infarction, unspecified: Secondary | ICD-10-CM | POA: Diagnosis not present

## 2021-11-16 DIAGNOSIS — I714 Abdominal aortic aneurysm, without rupture, unspecified: Secondary | ICD-10-CM | POA: Diagnosis not present

## 2021-11-16 DIAGNOSIS — I2510 Atherosclerotic heart disease of native coronary artery without angina pectoris: Secondary | ICD-10-CM | POA: Diagnosis not present

## 2021-11-16 DIAGNOSIS — I10 Essential (primary) hypertension: Secondary | ICD-10-CM | POA: Insufficient documentation

## 2021-11-16 DIAGNOSIS — D6869 Other thrombophilia: Secondary | ICD-10-CM | POA: Diagnosis not present

## 2021-11-16 LAB — CBC
HCT: 41.6 % (ref 39.0–52.0)
Hemoglobin: 13.3 g/dL (ref 13.0–17.0)
MCH: 31.4 pg (ref 26.0–34.0)
MCHC: 32 g/dL (ref 30.0–36.0)
MCV: 98.1 fL (ref 80.0–100.0)
Platelets: 149 10*3/uL — ABNORMAL LOW (ref 150–400)
RBC: 4.24 MIL/uL (ref 4.22–5.81)
RDW: 14.3 % (ref 11.5–15.5)
WBC: 10.2 10*3/uL (ref 4.0–10.5)
nRBC: 0 % (ref 0.0–0.2)

## 2021-11-16 LAB — BASIC METABOLIC PANEL
Anion gap: 9 (ref 5–15)
BUN: 17 mg/dL (ref 8–23)
CO2: 24 mmol/L (ref 22–32)
Calcium: 8.1 mg/dL — ABNORMAL LOW (ref 8.9–10.3)
Chloride: 105 mmol/L (ref 98–111)
Creatinine, Ser: 1.04 mg/dL (ref 0.61–1.24)
GFR, Estimated: >60 mL/min (ref >60)
Glucose, Bld: 131 mg/dL — ABNORMAL HIGH (ref 70–99)
Potassium: 3.5 mmol/L (ref 3.5–5.1)
Sodium: 138 mmol/L (ref 135–145)

## 2021-11-16 NOTE — Progress Notes (Signed)
Primary Care Physician: Manon Hilding, MD Primary Cardiologist: Dr Carlyle Dolly Primary Electrophysiologist: Dr Curt Bears Referring Physician: [facility]/Andy Tillery PA   Justin Lyons is a 86 y.o. male with a history of CAD, AAA s/p endovascular repair, carotid artery disease, COPD, HTN, HLD, CVA, atrial fibrillation who presents for follow up in the [facility].  The patient was admitted 05/2021 with a cryptogenic stroke and an ILR was placed at that time. The device clinic received an alert for an afib episode on 10/15/21 which lasted close to 3 hours. Patient has a CHADS2VASC score of 6. He was unaware of his arrhythmia.   On follow up today, patient reports that he has done well since his last visit. He has not had any bleeding issues since starting on anticoagulation. His ILR has not shown any interim episodes of afib.   Today, he denies symptoms of palpitations, chest pain, shortness of breath, orthopnea, PND, lower extremity edema, dizziness, presyncope, syncope, snoring, daytime somnolence, bleeding, or neurologic sequela. The patient is tolerating medications without difficulties and is otherwise without complaint today.    Atrial Fibrillation Risk Factors:  he does not have symptoms or diagnosis of sleep apnea. he does not have a history of rheumatic fever.   he has a BMI of Body mass index is 23.27 kg/m.[name] Filed Weights   11/16/21 0843  Weight: 73.6 kg    Family History  Problem Relation Age of Onset   Heart disease Father        Heart Disease before age 70   Heart attack Father    Cancer Mother        Colon   Heart disease Mother    Hyperlipidemia Mother    Hypertension Mother    Heart attack Mother    Colon cancer Mother    Cancer Brother        lung cancer     Atrial Fibrillation Management history:  Previous antiarrhythmic drugs: none Previous cardioversions: none Previous ablations: none CHADS2VASC score:  6 Anticoagulation history: Eliquis   Past Medical History:  Diagnosis Date   AAA (abdominal aortic aneurysm) ([place])    06/02/2003 old BPG   Arthritis    Carotid artery occlusion    Carotid bruit    COPD (chronic obstructive pulmonary disease) (H[facility])    Coronary artery disease    Diverticulosis of colon    Erectile dysfunction    Hemorrhoids, internal    History of compression fracture of spine    History of gout    Hyperlipidemia    Hypertension    Knee pain    Myocardial infarction [facility]) 2005   Peripheral vascular disease ([place])    Past Surgical History:  Procedure Laterality Date   ABDOMINAL AORTIC ENDOVASCULAR STENT GRAFT N/A 04/07/2017   Procedure: ENDOVASCULAR ABDOMINAL ANEURYSM REPAIR;  Surgeon: Serafina Mitchell, MD;  Location: [place];  Service: Vascular;  Laterality: N/A;   CORONARY ANGIOPLASTY  2005   LOOP RECORDER INSERTION N/A 06/07/2021   Procedure: LOOP RECORDER INSERTION;  Surgeon: Constance Haw, MD;  Location: [place] [facility];  Service: Cardiovascular;  Laterality: N/A;    Current Outpatient Medications  Medication Sig Dispense Refill   albuterol (PROVENTIL) (2.5 MG/3ML) 0.083% nebulizer solution Take 2.5 mg by nebulization every 6 (six) hours as needed for wheezing or shortness of breath.     albuterol (VENTOLIN HFA) 108 (90 Base) MCG/ACT inhaler Inhale 1-2 puffs into the lungs every 6 (six) hours  as needed for wheezing or shortness of breath.     allopurinol (ZYLOPRIM) 100 MG tablet Take 100 mg by mouth daily.     apixaban (ELIQUIS) 5 MG TABS tablet Take 1 tablet (5 mg total) by mouth 2 (two) times daily. 180 tablet 1   aspirin EC 81 MG tablet Take 1 tablet (81 mg total) by mouth daily. Swallow whole. 150 tablet 2   atorvastatin (LIPITOR) 80 MG tablet Take 1 tablet (80 mg total) by mouth daily. 90 tablet 3   BREZTRI AEROSPHERE 160-9-4.8 MCG/ACT AERO Inhale into the lungs.     Cholecalciferol (VITAMIN D) 1000 UNITS capsule Take 1,000 Units by mouth daily.      diclofenac (VOLTAREN) 75 MG EC tablet Take 1 tablet by mouth daily.     metoprolol tartrate (LOPRESSOR) 25 MG tablet Take 25 mg by mouth 2 (two) times daily.      nitroGLYCERIN (NITROSTAT) 0.4 MG SL tablet Place 1 tablet (0.4 mg total) under the tongue every 5 (five) minutes as needed. 25 tablet 3   pantoprazole (PROTONIX) 40 MG tablet Take 1 tablet (40 mg total) by mouth daily. 90 tablet 3   No current facility-administered medications for this encounter.    Allergies  Allergen Reactions   Doxycycline Rash   Penicillins Rash   Sulfonamide Derivatives Rash    Social History   Socioeconomic History   Marital status: Single    Spouse name: Not on file   Number of children: Not on file   Years of education: Not on file   Highest education level: Not on file  Occupational History   Occupation: part time in a pharmacy    Employer: RETIRED  Tobacco Use   Smoking status: Former    Packs/day: 1.50    Years: 20.00    Total pack years: 30.00    Types: Cigarettes    Quit date: 01/22/1982    Years since quitting: 39.8   Smokeless tobacco: Never   Tobacco comments:    Former smoker 10/18/21  Vaping Use   Vaping Use: Never used  Substance and Sexual Activity   Alcohol use: No   Drug use: No   Sexual activity: Not on file  Other Topics Concern   Not on file  Social History Narrative   Not on file   Social Determinants of Health   Financial Resource Strain: Not on file  Food Insecurity: No Food Insecurity (10/12/2021)   Hunger Vital Sign    Worried About Running Out of Food in the Last Year: Never true    Ran Out of Food in the Last Year: Never true  Transportation Needs: No Transportation Needs (10/12/2021)   PRAPARE - Hydrologist (Medical): No    Lack of Transportation (Non-Medical): No  Physical Activity: Not on file  Stress: Not on file  Social Connections: Not on file  Intimate Partner Violence: Not on file     ROS- All systems are  reviewed and negative except as per the HPI above.  Physical Exam: Vitals:   11/16/21 0843  BP: (!) 148/68  Pulse: 67  Weight: 73.6 kg  Height: '5\' 10"'$  (1.778 m)    GEN- The patient is a well appearing elderly male, alert and oriented x 3 today.   HEENT-head normocephalic, atraumatic, sclera clear, conjunctiva pink, hearing intact, trachea midline. Lungs- Clear to ausculation bilaterally, normal work of breathing Heart- Regular rate and rhythm, no murmurs, rubs or gallops  GI- soft, NT,  ND, + BS Extremities- no clubbing, cyanosis, or edema MS- no significant deformity or atrophy Skin- no rash or lesion Psych- euthymic mood, full affect Neuro- strength and sensation are intact   Wt Readings from Last 3 Encounters:  11/16/21 73.6 kg  10/18/21 74.6 kg  10/13/21 73.7 kg    EKG today demonstrates  SR Vent. rate 67 BPM PR interval 178 ms QRS duration 80 ms QT/QTcB 402/424 ms  Echo 06/06/21 demonstrated   1. Left ventricular ejection fraction, by estimation, is 60 to 65%. The  left ventricle has normal function. The left ventricle has no regional  wall motion abnormalities. There is mild left ventricular hypertrophy of  the basal-septal segment. Left ventricular diastolic parameters are consistent with Grade I diastolic dysfunction (impaired relaxation).   2. Right ventricular systolic function is normal. The right ventricular  size is normal.   3. The mitral valve is normal in structure. Mild mitral valve  regurgitation. No evidence of mitral stenosis.   4. The aortic valve is tricuspid. Aortic valve regurgitation is trivial.  No aortic stenosis is present.   Conclusion(s)/Recommendation(s): No intracardiac source of embolism detected on this transthoracic study. Consider a transesophageal echocardiogram to exclude cardiac source of embolism if clinically indicated.   Epic records are reviewed at length today  CHA2DS2-VASc Score = 6  The patient's score is based  upon: CHF History: 0 HTN History: 1 Diabetes History: 0 Stroke History: 2 Vascular Disease History: 1 Age Score: 2 Gender Score: 0       ASSESSMENT AND PLAN: 1. Paroxysmal Atrial Fibrillation (ICD10:  I48.0) The patient's CHA2DS2-VASc score is 6, indicating a 9.7% annual risk of stroke.   ILR shows no interim episodes of afib.  Continue Eliquis 5 mg BID Check bmet/cbc today Continue Lopressor 25 mg BID  2. Secondary Hypercoagulable State (ICD10:  D68.69) The patient is at significant risk for stroke/thromboembolism based upon his CHA2DS2-VASc Score of 6.  Continue Apixaban (Eliquis).   3. CAD No anginal symptoms.  4. HTN Stable, no changes today.   Follow up with Dr Harl Bowie as scheduled and the AF clinic in one year.    [facility] [address] [place], [place] [address] [phone] 11/16/2021 9:18 AM

## 2021-11-24 ENCOUNTER — Other Ambulatory Visit (HOSPITAL_COMMUNITY): Payer: Self-pay

## 2021-11-24 MED ORDER — APIXABAN 5 MG PO TABS: 5.0000 mg | ORAL_TABLET | Freq: Two times a day (BID) | ORAL | 0 refills | Status: DC

## 2021-11-27 ENCOUNTER — Ambulatory Visit (INDEPENDENT_AMBULATORY_CARE_PROVIDER_SITE_OTHER): Payer: Medicare HMO

## 2021-11-27 DIAGNOSIS — I639 Cerebral infarction, unspecified: Secondary | ICD-10-CM

## 2021-11-29 LAB — CUP PACEART REMOTE DEVICE CHECK
Date Time Interrogation Session: 20231106094926
Implantable Pulse Generator Implant Date: 20230517

## 2021-12-25 DIAGNOSIS — I10 Essential (primary) hypertension: Secondary | ICD-10-CM | POA: Diagnosis not present

## 2021-12-25 DIAGNOSIS — D529 Folate deficiency anemia, unspecified: Secondary | ICD-10-CM | POA: Diagnosis not present

## 2021-12-25 DIAGNOSIS — I2510 Atherosclerotic heart disease of native coronary artery without angina pectoris: Secondary | ICD-10-CM | POA: Diagnosis not present

## 2021-12-25 DIAGNOSIS — Z23 Encounter for immunization: Secondary | ICD-10-CM | POA: Diagnosis not present

## 2021-12-25 DIAGNOSIS — I771 Stricture of artery: Secondary | ICD-10-CM | POA: Diagnosis not present

## 2021-12-25 DIAGNOSIS — Z6825 Body mass index (BMI) 25.0-25.9, adult: Secondary | ICD-10-CM | POA: Diagnosis not present

## 2021-12-25 DIAGNOSIS — Z0000 Encounter for general adult medical examination without abnormal findings: Secondary | ICD-10-CM | POA: Diagnosis not present

## 2021-12-25 DIAGNOSIS — M539 Dorsopathy, unspecified: Secondary | ICD-10-CM | POA: Diagnosis not present

## 2021-12-25 DIAGNOSIS — J449 Chronic obstructive pulmonary disease, unspecified: Secondary | ICD-10-CM | POA: Diagnosis not present

## 2022-01-01 ENCOUNTER — Ambulatory Visit (INDEPENDENT_AMBULATORY_CARE_PROVIDER_SITE_OTHER): Payer: Medicare HMO

## 2022-01-01 DIAGNOSIS — I639 Cerebral infarction, unspecified: Secondary | ICD-10-CM | POA: Diagnosis not present

## 2022-01-02 LAB — CUP PACEART REMOTE DEVICE CHECK
Date Time Interrogation Session: 20231210231125
Implantable Pulse Generator Implant Date: 20230517

## 2022-01-04 NOTE — Progress Notes (Signed)
Carelink Summary Report / Loop Recorder 

## 2022-01-13 DIAGNOSIS — R059 Cough, unspecified: Secondary | ICD-10-CM | POA: Diagnosis not present

## 2022-01-13 DIAGNOSIS — R0982 Postnasal drip: Secondary | ICD-10-CM | POA: Diagnosis not present

## 2022-01-13 DIAGNOSIS — R499 Unspecified voice and resonance disorder: Secondary | ICD-10-CM | POA: Diagnosis not present

## 2022-01-13 DIAGNOSIS — J069 Acute upper respiratory infection, unspecified: Secondary | ICD-10-CM | POA: Diagnosis not present

## 2022-02-05 ENCOUNTER — Ambulatory Visit (INDEPENDENT_AMBULATORY_CARE_PROVIDER_SITE_OTHER): Payer: Medicare HMO

## 2022-02-05 DIAGNOSIS — I639 Cerebral infarction, unspecified: Secondary | ICD-10-CM

## 2022-02-06 LAB — CUP PACEART REMOTE DEVICE CHECK
Date Time Interrogation Session: 20240112230808
Implantable Pulse Generator Implant Date: 20230517

## 2022-02-09 NOTE — Progress Notes (Signed)
[facility] Summary Report / Loop Recorder

## 2022-03-11 LAB — CUP PACEART REMOTE DEVICE CHECK
Date Time Interrogation Session: 20240214230941
Implantable Pulse Generator Implant Date: 20230517

## 2022-03-12 ENCOUNTER — Ambulatory Visit (INDEPENDENT_AMBULATORY_CARE_PROVIDER_SITE_OTHER): Payer: Medicare HMO

## 2022-03-12 DIAGNOSIS — I639 Cerebral infarction, unspecified: Secondary | ICD-10-CM

## 2022-03-27 ENCOUNTER — Other Ambulatory Visit (HOSPITAL_COMMUNITY): Payer: Self-pay | Admitting: Physician Assistant

## 2022-03-28 NOTE — Progress Notes (Signed)
Carelink Summary Report / Loop Recorder 

## 2022-04-16 ENCOUNTER — Ambulatory Visit (INDEPENDENT_AMBULATORY_CARE_PROVIDER_SITE_OTHER): Payer: Medicare HMO

## 2022-04-16 DIAGNOSIS — I639 Cerebral infarction, unspecified: Secondary | ICD-10-CM | POA: Diagnosis not present

## 2022-04-16 LAB — CUP PACEART REMOTE DEVICE CHECK
Date Time Interrogation Session: 20240324230748
Implantable Pulse Generator Implant Date: 20230517

## 2022-04-26 NOTE — Progress Notes (Signed)
Carelink Summary Report / Loop Recorder 

## 2022-05-15 ENCOUNTER — Ambulatory Visit: Payer: Medicare HMO | Attending: Cardiology | Admitting: Cardiology

## 2022-05-15 ENCOUNTER — Encounter: Payer: Self-pay | Admitting: Cardiology

## 2022-05-15 VITALS — BP 155/75 | HR 58 | Ht 70.0 in | Wt 154.8 lb

## 2022-05-15 DIAGNOSIS — E782 Mixed hyperlipidemia: Secondary | ICD-10-CM

## 2022-05-15 DIAGNOSIS — I10 Essential (primary) hypertension: Secondary | ICD-10-CM

## 2022-05-15 DIAGNOSIS — I2510 Atherosclerotic heart disease of native coronary artery without angina pectoris: Secondary | ICD-10-CM

## 2022-05-15 NOTE — Patient Instructions (Signed)
Medication Instructions:  Continue all current medications.   Labwork: none  Testing/Procedures: none  Follow-Up: 6 months   Any Other Special Instructions Will Be Listed Below (If Applicable).   If you need a refill on your cardiac medications before your next appointment, please call your pharmacy.  

## 2022-05-15 NOTE — Progress Notes (Signed)
Clinical Summary Justin Lyons is a 87 y.o.male seen today for follow up of the following medical problems.    1. CAD - remote history of caths somewhat unclear. From available clinic notes DES to LCX in 2005. Notes mention prior to that a remote history of PTCA to RCA     - no chest pain, no SOB/DOE - compliant with meds   2. AAA - s/p endovascular repair - followed by vascular, last visit 05/2021   3. Carotid stenosis with left subclavian stenosis - 11/2019 [place]  mild bilaterally.  - bp's need to be checked in right arm     4. COPD - managed by pcp   5. HTN - had some low bp's, pcp lowered lopressor to 12.5mg  bid - due to left subclavian stenosis, bp's must be measured in right arm   - home bp's typically 110s-120s/60s-70s   6. Hyperlipidemia Jan 2020 TC 140 TG 135 HDL 35 LDL 78 - labs followed by pcp    05/2021 TC 98 TG 63 HDL 28 LDL 57 12/2021 TC 96 TG 74 HDL 31 lDL 49     7. CVA 05/2021 - loop recorder followed by EP - on plavix, off ASA  8. PAF - detected by loop recorder 10/15/21, lasted 3 hrs, was asymptomatic - no recent palpitations - no bleeding on eliquis   Past Medical History:  Diagnosis Date   AAA (abdominal aortic aneurysm)    06/02/2003 old BPG   Arthritis    Carotid artery occlusion    Carotid bruit    COPD (chronic obstructive pulmonary disease)    Coronary artery disease    Diverticulosis of colon    Erectile dysfunction    Hemorrhoids, internal    History of compression fracture of spine    History of gout    Hyperlipidemia    Hypertension    Knee pain    Myocardial infarction 2005   Peripheral vascular disease      Allergies  Allergen Reactions   Doxycycline Rash   Penicillins Rash   Sulfonamide Derivatives Rash     Current Outpatient Medications  Medication Sig Dispense Refill   albuterol (PROVENTIL) (2.5 MG/3ML) 0.083% nebulizer solution Take 2.5 mg by nebulization every 6 (six) hours as needed for wheezing or  shortness of breath.     albuterol (VENTOLIN HFA) 108 (90 Base) MCG/ACT inhaler Inhale 1-2 puffs into the lungs every 6 (six) hours as needed for wheezing or shortness of breath.     allopurinol (ZYLOPRIM) 100 MG tablet Take 100 mg by mouth daily.     apixaban (ELIQUIS) 5 MG TABS tablet TAKE 1 TABLET TWICE DAILY (DISCONTINUE PLAVIX) 180 tablet 2   atorvastatin (LIPITOR) 80 MG tablet Take 1 tablet (80 mg total) by mouth daily. 90 tablet 3   Cholecalciferol (VITAMIN D) 1000 UNITS capsule Take 1,000 Units by mouth daily.     diclofenac (VOLTAREN) 75 MG EC tablet Take 1 tablet by mouth daily.     metoprolol tartrate (LOPRESSOR) 25 MG tablet Take 25 mg by mouth 2 (two) times daily.      nitroGLYCERIN (NITROSTAT) 0.4 MG SL tablet Place 1 tablet (0.4 mg total) under the tongue every 5 (five) minutes as needed. (Patient taking differently: Place 0.4 mg under the tongue every 5 (five) minutes x 3 doses as needed.) 25 tablet 3   pantoprazole (PROTONIX) 40 MG tablet Take 1 tablet (40 mg total) by mouth daily. 90 tablet 3  No current facility-administered medications for this visit.     Past Surgical History:  Procedure Laterality Date   ABDOMINAL AORTIC ENDOVASCULAR STENT GRAFT N/A 04/07/2017   Procedure: ENDOVASCULAR ABDOMINAL ANEURYSM REPAIR;  Surgeon: Nada Libman, MD;  Location: [facility];  Service: Vascular;  Laterality: N/A;   CORONARY ANGIOPLASTY  2005   LOOP RECORDER INSERTION N/A 06/07/2021   Procedure: LOOP RECORDER INSERTION;  Surgeon: Regan Lemming, MD;  Location: [facility];  Service: Cardiovascular;  Laterality: N/A;     Allergies  Allergen Reactions   Doxycycline Rash   Penicillins Rash   Sulfonamide Derivatives Rash      Family History  Problem Relation Age of Onset   Heart disease Father        Heart Disease before age 82   Heart attack Father    Cancer Mother        Colon   Heart disease Mother    Hyperlipidemia Mother    Hypertension Mother    Heart  attack Mother    Colon cancer Mother    Cancer Brother        lung cancer     Social History Justin Lyons reports that he quit smoking about 40 years ago. His smoking use included cigarettes. He has a 30.00 pack-year smoking history. He has never used smokeless tobacco. Justin Lyons reports no history of alcohol use.   Review of Systems CONSTITUTIONAL: No weight loss, fever, chills, weakness or fatigue.  HEENT: Eyes: No visual loss, blurred vision, double vision or yellow sclerae.No hearing loss, sneezing, congestion, runny nose or sore throat.  SKIN: No rash or itching.  CARDIOVASCULAR: per hpi RESPIRATORY: No shortness of breath, cough or sputum.  GASTROINTESTINAL: No anorexia, nausea, vomiting or diarrhea. No abdominal pain or blood.  GENITOURINARY: No burning on urination, no polyuria NEUROLOGICAL: No headache, dizziness, syncope, paralysis, ataxia, numbness or tingling in the extremities. No change in bowel or bladder control.  MUSCULOSKELETAL: No muscle, back pain, joint pain or stiffness.  LYMPHATICS: No enlarged nodes. No history of splenectomy.  PSYCHIATRIC: No history of depression or anxiety.  ENDOCRINOLOGIC: No reports of sweating, cold or heat intolerance. No polyuria or polydipsia.  [name]   Physical Examination Today's Vitals   05/15/22 0829 05/15/22 0856  BP: (!) 160/70 (!) 155/75  Pulse: (!) 58   SpO2: 96%   Weight: 154 lb 12.8 oz (70.2 kg)   Height: 5\' 10"  (1.778 m)    Body mass index is 22.21 kg/m.  Gen: resting comfortably, no acute distress HEENT: no scleral icterus, pupils equal round and reactive, no palptable cervical adenopathy,  CV: RRR, no m/rg, no jvd Resp: Clear to auscultation bilaterally GI: abdomen is soft, non-tender, non-distended, normal bowel sounds, no hepatosplenomegaly MSK: extremities are warm, no edema.  Skin: warm, no rash Neuro:  no focal deficits Psych: appropriate affect   Diagnostic Studies  08/2003 cath CLINICAL HISTORY:  Mr.  Justin Lyons is 87 years old and had a remote PTCA of the  right coronary artery by Dr. Nicki Lyons and was admitted yesterday with  chest pain and minimal ST elevation in the inferior leads.  He was studied  by Dr. Salvadore Lyons and found to have a chronically totally occluded  right coronary artery which was small, and 80% to 90% stenosis near the  ostium of the circumflex artery with 70% to 80% ostial stenosis of the first  marginal Boubacar Lerette.  Dr. Samule Ohm stented the proximal circumflex artery with a  TAXUS stent, feeling this was the culprit vessel.  The patient developed  some recurrent chest discomfort last night and today, and for this reason  was brought back to the laboratory for reevaluation.  His EKG showed slight  inferior ST elevation.    PROCEDURE:  The procedure was performed via the left femoral artery using  arterial sheath and a left diagnostic catheter.  Femoral arterial puncture  was performed and Omnipaque contrast was used.  The left femoral artery was  closed with AngioSeal at the end of the procedure.  The patient tolerated  the procedure well and left the laboratory in satisfactory condition.    RESULTS:  The stent in the proximal circumflex artery which crossed the  first marginal Raveena Hebdon was widely patent with no stenosis.  The first  marginal Zoriana Oats was narrowed at the ostium to 70% to 80%.  This appeared  better than on the immediate post-stenting film from yesterday.  There was  also a 30% to 40% narrowing downstream in the circumflex artery.    CONCLUSION:  Widely patent stent in the proximal circumflex artery with 80%  narrowing in a side Jimel Myler, but with brisk TIMI-3 flow.    RECOMMENDATIONS:  The stent is widely patent and the side Decker Cogdell has brisk  TIMI flow and the ostial stenosis appears better than the post-procedure  films.  I think it is very unlikely that the patient's symptoms are related  to the marginal Tigran Haynie narrowing.  His symptoms may be  related to his Plavix  and we will plan to treat him with Pepcid and continued observation.     08/2003 cath COMPLICATIONS:  None.    FINDINGS:  1. LV 114/5/13.  EF approximately 60% without regional wall motion     abnormality in an RAO projection.  2. No aortic stenosis or mitral regurgitation.  3. Left main:  Angiographically normal.  4. LAD:  Large vessel wrapping the apex.  There is a 50% stenosis of the     proximal vessel.  5. Circumflex:  Hazy, approximately 80% stenosis of the proximal vessel     involving the takeoff of moderate sized first obtuse marginal.  This     first marginal had an 80% stenosis.  The AV groove circumflex was stented     to no residual stenosis.  There remained a 70% stenosis in the obtuse     marginal.  TIMI 3 flow was maintained in both the circumflex proper and     the first obtuse marginal.  6. RCA:  Moderate sized, dominant vessel.  There is a total occlusion of the     mid vessel after the takeoff of the acute marginal.  There are bridging     collaterals and modest left to right collaterals.  The bridging     collaterals are very suggestive of chronic total occlusion.  Films are     reviewed with Dr. Juanda Chance who agreed.    IMPRESSION/RECOMMENDATIONS:  Successful drug-eluting stent placement in the  culprit lesion of the proximal circumflex.  The patient tolerated the  procedure well.  Eptifibatide will be continued for 18 hours.  Sheaths will  be removed when the ACT is less than 175 seconds.    The patient has agreed to participate in the TRITON study comparing Plavix  with a novel thienopyridine.  He will be continued on this study drug for  approximately one year.  He, thus, should not receive any Plavix through  that period.  Aspirin will be continued indefinitely.   11/2019 carotid US Summary:  Right Carotid: Velocities in the right ICA are consistent with a 1-39%  stenosis.   Left Carotid: Velocities in the left ICA are consistent  with a 1-39%  stenosis.   Vertebrals:  Right vertebral artery demonstrates antegrade flow. Left  vertebral               artery demonstrates retrograde flow.  Subclavians: Left subclavian artery was stenotic. Normal flow hemodynamics  were               seen in the right subclavian artery.    05/2021 echo 1. Left ventricular ejection fraction, by estimation, is 60 to 65%. The  left ventricle has normal function. The left ventricle has no regional  wall motion abnormalities. There is mild left ventricular hypertrophy of  the basal-septal segment. Left  ventricular diastolic parameters are consistent with Grade I diastolic  dysfunction (impaired relaxation).   2. Right ventricular systolic function is normal. The right ventricular  size is normal.   3. The mitral valve is normal in structure. Mild mitral valve  regurgitation. No evidence of mitral stenosis.   4. The aortic valve is tricuspid. Aortic valve regurgitation is trivial.  No aortic stenosis is present.    Assessment and Plan   1. CAD -no symptoms, continue current meds   2. HTN -elevated in clinic, home numbers are at goal - continue current meds, continue to monitor home bp's   3. Hyperlipidemia - he is at goal, continue current meds    Antoine Poche, M.D.

## 2022-05-18 ENCOUNTER — Ambulatory Visit (INDEPENDENT_AMBULATORY_CARE_PROVIDER_SITE_OTHER): Payer: Medicare HMO

## 2022-05-18 DIAGNOSIS — I480 Paroxysmal atrial fibrillation: Secondary | ICD-10-CM

## 2022-05-21 LAB — CUP PACEART REMOTE DEVICE CHECK
Date Time Interrogation Session: 20240426230134
Implantable Pulse Generator Implant Date: 20230517

## 2022-05-29 NOTE — Progress Notes (Signed)
Carelink Summary Report / Loop Recorder 

## 2022-06-12 NOTE — Progress Notes (Signed)
Carelink Summary Report / Loop Recorder 

## 2022-06-19 ENCOUNTER — Other Ambulatory Visit: Payer: Self-pay | Admitting: *Deleted

## 2022-06-19 DIAGNOSIS — I714 Abdominal aortic aneurysm, without rupture, unspecified: Secondary | ICD-10-CM

## 2022-06-19 DIAGNOSIS — I739 Peripheral vascular disease, unspecified: Secondary | ICD-10-CM

## 2022-06-20 ENCOUNTER — Ambulatory Visit (INDEPENDENT_AMBULATORY_CARE_PROVIDER_SITE_OTHER): Payer: Medicare HMO

## 2022-06-20 DIAGNOSIS — I480 Paroxysmal atrial fibrillation: Secondary | ICD-10-CM | POA: Diagnosis not present

## 2022-06-21 LAB — CUP PACEART REMOTE DEVICE CHECK
Date Time Interrogation Session: 20240529230308
Implantable Pulse Generator Implant Date: 20230517

## 2022-06-26 DIAGNOSIS — M109 Gout, unspecified: Secondary | ICD-10-CM | POA: Diagnosis not present

## 2022-06-26 DIAGNOSIS — M542 Cervicalgia: Secondary | ICD-10-CM | POA: Diagnosis not present

## 2022-06-26 DIAGNOSIS — I2510 Atherosclerotic heart disease of native coronary artery without angina pectoris: Secondary | ICD-10-CM | POA: Diagnosis not present

## 2022-06-26 DIAGNOSIS — K219 Gastro-esophageal reflux disease without esophagitis: Secondary | ICD-10-CM | POA: Diagnosis not present

## 2022-06-26 DIAGNOSIS — Z6823 Body mass index (BMI) 23.0-23.9, adult: Secondary | ICD-10-CM | POA: Diagnosis not present

## 2022-06-26 DIAGNOSIS — R030 Elevated blood-pressure reading, without diagnosis of hypertension: Secondary | ICD-10-CM | POA: Diagnosis not present

## 2022-06-26 DIAGNOSIS — J449 Chronic obstructive pulmonary disease, unspecified: Secondary | ICD-10-CM | POA: Diagnosis not present

## 2022-06-28 NOTE — Progress Notes (Signed)
Office Note     CC:  follow up Requesting Provider:  Estanislado Pandy, MD  HPI: Justin Lyons is a 87 y.o. (05/16/1935) male who presents for surveillance follow up of AAA and Carotid artery stenosis. He reports today that he is doing well. He has no concerns. He is s/p EVAR repair of symptomatic 5.4 cm AAA by Dr. Myra Gianotti on 04/07/2017. He has done well since his repair. He presently denies any abdominal pain or back pain. He does not have any pain in his legs on ambulation or rest. No tissue loss.   We have also been following his bilateral 1-39% ICA stenosis for several years now. He does have history of CVA. He denies any visual changes, slurred speech, facial drooping, unilateral upper or lower extremity weakness or numbness.   The pt is on a statin for cholesterol management.    The pt is on an aspirin.  Other AC: Eliquis The pt is on BB medication for hypertension.  The pt does not have diabetes. Tobacco hx:  former, quit 1984  Past Medical History:  Diagnosis Date   AAA (abdominal aortic aneurysm) (HCC)    06/02/2003 old BPG   Arthritis    Carotid artery occlusion    Carotid bruit    COPD (chronic obstructive pulmonary disease) (HCC)    Coronary artery disease    Diverticulosis of colon    Erectile dysfunction    Hemorrhoids, internal    History of compression fracture of spine    History of gout    Hyperlipidemia    Hypertension    Knee pain    Myocardial infarction [facility]) 2005   Peripheral vascular disease (HCC)     Past Surgical History:  Procedure Laterality Date   ABDOMINAL AORTIC ENDOVASCULAR STENT GRAFT N/A 04/07/2017   Procedure: ENDOVASCULAR ABDOMINAL ANEURYSM REPAIR;  Surgeon: Nada Libman, MD;  Location: [facility] OR;  Service: Vascular;  Laterality: N/A;   CORONARY ANGIOPLASTY  2005   LOOP RECORDER INSERTION N/A 06/07/2021   Procedure: LOOP RECORDER INSERTION;  Surgeon: Regan Lemming, MD;  Location: [facility] INVASIVE CV LAB;  Service: Cardiovascular;   Laterality: N/A;    Social History   Socioeconomic History   Marital status: Single    Spouse name: Not on file   Number of children: Not on file   Years of education: Not on file   Highest education level: Not on file  Occupational History   Occupation: part time in a pharmacy    Employer: RETIRED  Tobacco Use   Smoking status: Former    Packs/day: 1.50    Years: 20.00    Additional pack years: 0.00    Total pack years: 30.00    Types: Cigarettes    Quit date: 01/22/1982    Years since quitting: 40.4   Smokeless tobacco: Never   Tobacco comments:    Former smoker 10/18/21  Vaping Use   Vaping Use: Never used  Substance and Sexual Activity   Alcohol use: No   Drug use: No   Sexual activity: Not on file  Other Topics Concern   Not on file  Social History Narrative   Not on file   Social Determinants of Health   Financial Resource Strain: Not on file  Food Insecurity: No Food Insecurity (10/12/2021)   Hunger Vital Sign    Worried About Running Out of Food in the Last Year: Never true    Ran Out of Food in the Last Year: Never true  Transportation Needs: No Transportation Needs (10/12/2021)   PRAPARE - Administrator, Civil Service (Medical): No    Lack of Transportation (Non-Medical): No  Physical Activity: Not on file  Stress: Not on file  Social Connections: Not on file  Intimate Partner Violence: Not on file    Family History  Problem Relation Age of Onset   Heart disease Father        Heart Disease before age 62   Heart attack Father    Cancer Mother        Colon   Heart disease Mother    Hyperlipidemia Mother    Hypertension Mother    Heart attack Mother    Colon cancer Mother    Cancer Brother        lung cancer    Current Outpatient Medications  Medication Sig Dispense Refill   albuterol (PROVENTIL) (2.5 MG/3ML) 0.083% nebulizer solution Take 2.5 mg by nebulization every 6 (six) hours as needed for wheezing or shortness of breath.      albuterol (VENTOLIN HFA) 108 (90 Base) MCG/ACT inhaler Inhale 1-2 puffs into the lungs every 6 (six) hours as needed for wheezing or shortness of breath.     allopurinol (ZYLOPRIM) 100 MG tablet Take 100 mg by mouth daily.     apixaban (ELIQUIS) 5 MG TABS tablet TAKE 1 TABLET TWICE DAILY (DISCONTINUE PLAVIX) 180 tablet 2   atorvastatin (LIPITOR) 80 MG tablet Take 1 tablet (80 mg total) by mouth daily. 90 tablet 3   Cholecalciferol (VITAMIN D) 1000 UNITS capsule Take 1,000 Units by mouth daily.     diclofenac (VOLTAREN) 75 MG EC tablet Take 1 tablet by mouth daily.     metoprolol tartrate (LOPRESSOR) 25 MG tablet Take 25 mg by mouth 2 (two) times daily.      nitroGLYCERIN (NITROSTAT) 0.4 MG SL tablet Place 1 tablet (0.4 mg total) under the tongue every 5 (five) minutes as needed. (Patient taking differently: Place 0.4 mg under the tongue every 5 (five) minutes x 3 doses as needed.) 25 tablet 3   pantoprazole (PROTONIX) 40 MG tablet Take 1 tablet (40 mg total) by mouth daily. 90 tablet 3   No current facility-administered medications for this visit.    Allergies  Allergen Reactions   Doxycycline Rash   Penicillins Rash   Sulfonamide Derivatives Rash     REVIEW OF SYSTEMS:  [X]  denotes positive finding, [ ]  denotes negative finding Cardiac  Comments:  Chest pain or chest pressure:    Shortness of breath upon exertion:    Short of breath when lying flat:    Irregular heart rhythm:        Vascular    Pain in calf, thigh, or hip brought on by ambulation:    Pain in feet at night that wakes you up from your sleep:     Blood clot in your veins:    Leg swelling:         Pulmonary    Oxygen at home:    Productive cough:     Wheezing:         Neurologic    Sudden weakness in arms or legs:     Sudden numbness in arms or legs:     Sudden onset of difficulty speaking or slurred speech:    Temporary loss of vision in one eye:     Problems with dizziness:         Gastrointestinal     Blood in  stool:     Vomited blood:         Genitourinary    Burning when urinating:     Blood in urine:        Psychiatric    Major depression:         Hematologic    Bleeding problems:    Problems with blood clotting too easily:        Skin    Rashes or ulcers:        Constitutional    Fever or chills:      PHYSICAL EXAMINATION:  Vitals:   07/02/22 0827  BP: (!) 160/68  Pulse: (!) 57  Temp: 97.8 F (36.6 C)  TempSrc: Temporal  SpO2: 94%  Weight: 150 lb (68 kg)    General:  WDWN in NAD; vital signs documented above Gait: Normal HENT: WNL, normocephalic Pulmonary: normal non-labored breathing , without  wheezing Cardiac: regular HR Abdomen: soft, NT, no masses. Normal bowel sounds Vascular Exam/Pulses: 2+ radial, 2+ femoral, DP pulses bilaterally Extremities: without ischemic changes, without Gangrene , without cellulitis; without open wounds;  Musculoskeletal: no muscle wasting or atrophy  Neurologic: A&O X 3 Psychiatric:  The pt has Normal affect.   Non-Invasive Vascular Imaging:    Endovascular Aortic Repair (EVAR):  +----------+----------------+-------------------+-------------------+           Diameter AP (cm)Diameter Trans (cm)Velocities (cm/sec)  +----------+----------------+-------------------+-------------------+  Aorta    4.06            3.87                                    +----------+----------------+-------------------+-------------------+  Right Limb1.58            1.64               156                  +----------+----------------+-------------------+-------------------+  Left Limb 1.29            1.45               82                   +----------+----------------+-------------------+-------------------+   VAS US Carotid Duplex: Summary:  Abdominal Aorta: Patent endovascular aneurysm repair with no evidence of endoleak. The largest aortic diameter has decreased compared to prior exam. Previous diameter measurement  was 4.8 cm obtained on 06/12/2021.     Summary:  Right Carotid: Velocities in the right ICA are consistent with a 1-39% stenosis.   Left Carotid: Velocities in the left ICA are consistent with a 1-39% stenosis.   Vertebrals: Right vertebral artery demonstrates antegrade flow. Left vertebral artery demonstrates retrograde flow.  Subclavians: Left subclavian artery flow was disturbed. Normal flow hemodynamics were seen in the right subclavian artery.   ASSESSMENT/PLAN:: 87 y.o. male here for follow up for AAA and Carotid artery stenosis. He is s/p EVAR repair of symptomatic 5.4 cm AAA by Dr. Myra Gianotti on 04/07/2017. He is without any associated symptoms of back or abdominal pain. He does not have any claudication, rest pain or tissue loss. He does not have any TIA or stroke like symptoms.  - EVAR duplex today shows stable EVAR without endoleak - Carotid duplex shows stable 1-39% bilaterally with normal flow in the right vertebral and subclavian arteries, retrograde left vertebral with stenosis of left subclavian. However, no associated symptoms -  Continue  Eliquis and Lipitor - Reviewed signs and symptoms of TIA/ Stroke and he understands should this occur to seek immediate medical attention -He will have follow up carotid duplex in 2 years - He will return in 1 year for EVAR duplex   Graceann Congress, PA-C [facility] [phone]  Clinic MD:   Myra Gianotti

## 2022-07-02 ENCOUNTER — Other Ambulatory Visit: Payer: Self-pay | Admitting: *Deleted

## 2022-07-02 ENCOUNTER — Ambulatory Visit (INDEPENDENT_AMBULATORY_CARE_PROVIDER_SITE_OTHER)
Admission: RE | Admit: 2022-07-02 | Discharge: 2022-07-02 | Disposition: A | Discharge status: Home / Self Care | Payer: Medicare HMO | Source: Ambulatory Visit | Attending: Surgery | Admitting: Surgery

## 2022-07-02 ENCOUNTER — Ambulatory Visit: Payer: Medicare HMO | Admitting: Physician Assistant

## 2022-07-02 ENCOUNTER — Ambulatory Visit (HOSPITAL_COMMUNITY)
Admission: RE | Admit: 2022-07-02 | Discharge: 2022-07-02 | Disposition: A | Discharge status: Home / Self Care | Payer: Medicare HMO | Source: Ambulatory Visit | Attending: Surgery | Admitting: Surgery

## 2022-07-02 ENCOUNTER — Encounter: Payer: Self-pay | Admitting: Cardiology

## 2022-07-02 VITALS — BP 160/68 | HR 57 | Temp 97.8°F | Wt 150.0 lb

## 2022-07-02 DIAGNOSIS — I739 Peripheral vascular disease, unspecified: Secondary | ICD-10-CM

## 2022-07-02 DIAGNOSIS — I714 Abdominal aortic aneurysm, without rupture, unspecified: Secondary | ICD-10-CM

## 2022-07-02 DIAGNOSIS — Z95828 Presence of other vascular implants and grafts: Secondary | ICD-10-CM

## 2022-07-02 DIAGNOSIS — I6523 Occlusion and stenosis of bilateral carotid arteries: Secondary | ICD-10-CM | POA: Diagnosis not present

## 2022-07-02 MED ORDER — APIXABAN 5 MG PO TABS: ORAL_TABLET | ORAL | 1 refills | Status: DC

## 2022-07-02 NOTE — Telephone Encounter (Signed)
Prescription refill request for Eliquis received. Indication: PAF Last office visit: 05/15/22  Dominga Ferry MD Scr: 1.04 on 11/17/22  Epic Age: 87 Weight: 70.2kg  Based on above findings Eliquis 5mg  twice daily is the appropriate dose.  Refill approved.

## 2022-07-11 ENCOUNTER — Other Ambulatory Visit: Payer: Self-pay

## 2022-07-11 DIAGNOSIS — I714 Abdominal aortic aneurysm, without rupture, unspecified: Secondary | ICD-10-CM

## 2022-07-12 NOTE — Progress Notes (Signed)
Carelink Summary Report / Loop Recorder 

## 2022-07-23 ENCOUNTER — Ambulatory Visit (INDEPENDENT_AMBULATORY_CARE_PROVIDER_SITE_OTHER): Payer: Medicare HMO

## 2022-07-23 DIAGNOSIS — I480 Paroxysmal atrial fibrillation: Secondary | ICD-10-CM | POA: Diagnosis not present

## 2022-07-24 LAB — CUP PACEART REMOTE DEVICE CHECK
Date Time Interrogation Session: 20240701230143
Implantable Pulse Generator Implant Date: 20230517

## 2022-08-14 NOTE — Progress Notes (Signed)
Carelink Summary Report / Loop Recorder 

## 2022-08-26 LAB — CUP PACEART REMOTE DEVICE CHECK
Date Time Interrogation Session: 20240803230124
Implantable Pulse Generator Implant Date: 20230517

## 2022-08-27 ENCOUNTER — Ambulatory Visit: Payer: Medicare HMO

## 2022-08-27 DIAGNOSIS — I639 Cerebral infarction, unspecified: Secondary | ICD-10-CM | POA: Diagnosis not present

## 2022-09-11 NOTE — Progress Notes (Signed)
Carelink Summary Report / Loop Recorder 

## 2022-09-14 DIAGNOSIS — D692 Other nonthrombocytopenic purpura: Secondary | ICD-10-CM | POA: Diagnosis not present

## 2022-09-14 DIAGNOSIS — L570 Actinic keratosis: Secondary | ICD-10-CM | POA: Diagnosis not present

## 2022-09-14 DIAGNOSIS — L821 Other seborrheic keratosis: Secondary | ICD-10-CM | POA: Diagnosis not present

## 2022-09-14 DIAGNOSIS — C44319 Basal cell carcinoma of skin of other parts of face: Secondary | ICD-10-CM | POA: Diagnosis not present

## 2022-09-14 DIAGNOSIS — Z85828 Personal history of other malignant neoplasm of skin: Secondary | ICD-10-CM | POA: Diagnosis not present

## 2022-10-01 ENCOUNTER — Ambulatory Visit: Payer: Medicare HMO

## 2022-10-01 DIAGNOSIS — I639 Cerebral infarction, unspecified: Secondary | ICD-10-CM | POA: Diagnosis not present

## 2022-10-01 LAB — CUP PACEART REMOTE DEVICE CHECK
Date Time Interrogation Session: 20240905230238
Implantable Pulse Generator Implant Date: 20230517

## 2022-10-18 NOTE — Progress Notes (Signed)
Carelink Summary Report / Loop Recorder 

## 2022-10-24 DIAGNOSIS — C44319 Basal cell carcinoma of skin of other parts of face: Secondary | ICD-10-CM | POA: Diagnosis not present

## 2022-10-24 DIAGNOSIS — Z85828 Personal history of other malignant neoplasm of skin: Secondary | ICD-10-CM | POA: Diagnosis not present

## 2022-11-05 ENCOUNTER — Ambulatory Visit (INDEPENDENT_AMBULATORY_CARE_PROVIDER_SITE_OTHER): Payer: Medicare HMO

## 2022-11-05 DIAGNOSIS — I480 Paroxysmal atrial fibrillation: Secondary | ICD-10-CM | POA: Diagnosis not present

## 2022-11-05 LAB — CUP PACEART REMOTE DEVICE CHECK
Date Time Interrogation Session: 20241013230611
Implantable Pulse Generator Implant Date: 20230517

## 2022-11-20 ENCOUNTER — Encounter: Payer: Self-pay | Admitting: Cardiology

## 2022-11-20 ENCOUNTER — Ambulatory Visit: Payer: Medicare HMO | Attending: Cardiology | Admitting: Cardiology

## 2022-11-20 VITALS — BP 130/62 | HR 70 | Ht 70.0 in | Wt 152.8 lb

## 2022-11-20 DIAGNOSIS — I10 Essential (primary) hypertension: Secondary | ICD-10-CM

## 2022-11-20 DIAGNOSIS — R0602 Shortness of breath: Secondary | ICD-10-CM

## 2022-11-20 DIAGNOSIS — I2510 Atherosclerotic heart disease of native coronary artery without angina pectoris: Secondary | ICD-10-CM

## 2022-11-20 DIAGNOSIS — I480 Paroxysmal atrial fibrillation: Secondary | ICD-10-CM | POA: Diagnosis not present

## 2022-11-20 NOTE — Progress Notes (Addendum)
Clinical Summary Justin Lyons is a 87 y.o.male seen today for follow up of the following medical problems.    1. CAD - remote history of caths somewhat unclear. From available clinic notes DES to LCX in 2005. Notes mention prior to that a remote history of PTCA to RCA     - no chest pains. Recent SOB that is increased. No cough, no wheezing. No edema. Walking up hill at his home. - compliant with meds - 05/2021 LVEF 60-65%, grade I , normal RV, mild MR   2. AAA - s/p endovascular repair - followed by vascular, last visit 05/2021 - last visit 06/2022   3. Carotid stenosis with left subclavian stenosis - 11/2019 [place]  mild bilaterally.  - bp's need to be checked in right arm - last visit 06/2022     4. COPD - managed by pcp - reports some limitations on inhlares in the past due to side effects.    5. HTN - had some low bp's, pcp lowered lopressor to 12.5mg  bid - due to left subclavian stenosis, bp's must be measured in right arm   - home bp's typically 130s/60s   6. Hyperlipidemia - labs followed by pcp    05/2021 TC 98 TG 63 HDL 28 LDL 57 12/2021 TC 96 TG 74 HDL 31 LDL 49     7. CVA 05/2021 - loop recorder followed by EP - afib was detected 10/15/21, started on eliquis   8. PAF - detected by loop recorder 10/15/21, lasted 3 hrs, was asymptomatic - denies any palpitaitons - no bleeding on eliquis.  Past Medical History:  Diagnosis Date   AAA (abdominal aortic aneurysm) (HCC)    06/02/2003 old BPG   Arthritis    Carotid artery occlusion    Carotid bruit    COPD (chronic obstructive pulmonary disease) (HCC)    Coronary artery disease    Diverticulosis of colon    Erectile dysfunction    Hemorrhoids, internal    History of compression fracture of spine    History of gout    Hyperlipidemia    Hypertension    Knee pain    Myocardial infarction [facility]) 2005   Peripheral vascular disease (HCC)      Allergies  Allergen Reactions   Doxycycline Rash    Penicillins Rash   Sulfonamide Derivatives Rash     Current Outpatient Medications  Medication Sig Dispense Refill   albuterol (PROVENTIL) (2.5 MG/3ML) 0.083% nebulizer solution Take 2.5 mg by nebulization every 6 (six) hours as needed for wheezing or shortness of breath.     albuterol (VENTOLIN HFA) 108 (90 Base) MCG/ACT inhaler Inhale 1-2 puffs into the lungs every 6 (six) hours as needed for wheezing or shortness of breath.     allopurinol (ZYLOPRIM) 100 MG tablet Take 100 mg by mouth daily.     apixaban (ELIQUIS) 5 MG TABS tablet TAKE 1 TABLET TWICE DAILY (DISCONTINUE Justin Lyons) 180 tablet 1   atorvastatin (LIPITOR) 80 MG tablet Take 1 tablet (80 mg total) by mouth daily. 90 tablet 3   Cholecalciferol (VITAMIN D) 1000 UNITS capsule Take 1,000 Units by mouth daily.     diclofenac (VOLTAREN) 75 MG EC tablet Take 1 tablet by mouth daily.     metoprolol tartrate (LOPRESSOR) 25 MG tablet Take 25 mg by mouth 2 (two) times daily.      nitroGLYCERIN (NITROSTAT) 0.4 MG SL tablet Place 1 tablet (0.4 mg total) under the tongue every 5 (five)  minutes as needed. (Patient taking differently: Place 0.4 mg under the tongue every 5 (five) minutes x 3 doses as needed.) 25 tablet 3   pantoprazole (PROTONIX) 40 MG tablet Take 1 tablet (40 mg total) by mouth daily. 90 tablet 3   No current facility-administered medications for this visit.     Past Surgical History:  Procedure Laterality Date   ABDOMINAL AORTIC ENDOVASCULAR STENT GRAFT N/A 04/07/2017   Procedure: ENDOVASCULAR ABDOMINAL ANEURYSM REPAIR;  Surgeon: Nada Libman, MD;  Location: [facility] OR;  Service: Vascular;  Laterality: N/A;   CORONARY ANGIOPLASTY  2005   LOOP RECORDER INSERTION N/A 06/07/2021   Procedure: LOOP RECORDER INSERTION;  Surgeon: Regan Lemming, MD;  Location: [facility] INVASIVE CV LAB;  Service: Cardiovascular;  Laterality: N/A;     Allergies  Allergen Reactions   Doxycycline Rash   Penicillins Rash   Sulfonamide Derivatives  Rash      Family History  Problem Relation Age of Onset   Heart disease Father        Heart Disease before age 20   Heart attack Father    Cancer Mother        Colon   Heart disease Mother    Hyperlipidemia Mother    Hypertension Mother    Heart attack Mother    Colon cancer Mother    Cancer Brother        lung cancer     Social History Justin Lyons reports that he quit smoking about 40 years ago. His smoking use included cigarettes. He started smoking about 60 years ago. He has a 30 pack-year smoking history. He has never used smokeless tobacco. Justin Lyons reports no history of alcohol use.   Review of Systems CONSTITUTIONAL: No weight loss, fever, chills, weakness or fatigue.  HEENT: Eyes: No visual loss, blurred vision, double vision or yellow sclerae.No hearing loss, sneezing, congestion, runny nose or sore throat.  SKIN: No rash or itching.  CARDIOVASCULAR: per hpi RESPIRATORY: No shortness of breath, cough or sputum.  GASTROINTESTINAL: No anorexia, nausea, vomiting or diarrhea. No abdominal pain or blood.  GENITOURINARY: No burning on urination, no polyuria NEUROLOGICAL: No headache, dizziness, syncope, paralysis, ataxia, numbness or tingling in the extremities. No change in bowel or bladder control.  MUSCULOSKELETAL: No muscle, back pain, joint pain or stiffness.  LYMPHATICS: No enlarged nodes. No history of splenectomy.  PSYCHIATRIC: No history of depression or anxiety.  ENDOCRINOLOGIC: No reports of sweating, cold or heat intolerance. No polyuria or polydipsia.  [name]   Physical Examination Today's Vitals   11/20/22 0829 11/20/22 0837  BP: (!) 140/68 130/62  Pulse: 70   SpO2: 95%   Weight: 152 lb 12.8 oz (69.3 kg)   Height: 5\' 10"  (1.778 m)    Body mass index is 21.92 kg/m.  Gen: resting comfortably, no acute distress HEENT: no scleral icterus, pupils equal round and reactive, no palptable cervical adenopathy,  CV: RRR, no mrg, no jvd Resp: Clear to  auscultation bilaterally GI: abdomen is soft, non-tender, non-distended, normal bowel sounds, no hepatosplenomegaly MSK: extremities are warm, no edema.  Skin: warm, no rash Neuro:  no focal deficits Psych: appropriate affect   Diagnostic Studies  08/2003 cath CLINICAL HISTORY:  Justin Lyons is 87 years old and had a remote PTCA of the  right coronary artery by Dr. Nicki Guadalajara and was admitted yesterday with  chest pain and minimal ST elevation in the inferior leads.  He was studied  by Dr. Salvadore Farber  and found to have a chronically totally occluded  right coronary artery which was small, and 80% to 90% stenosis near the  ostium of the circumflex artery with 70% to 80% ostial stenosis of the first  marginal Justin Lyons.  Dr. Samule Ohm stented the proximal circumflex artery with a  TAXUS stent, feeling this was the culprit vessel.  The patient developed  some recurrent chest discomfort last night and today, and for this reason  was brought back to the laboratory for reevaluation.  His EKG showed slight  inferior ST elevation.    PROCEDURE:  The procedure was performed via the left femoral artery using  arterial sheath and a left diagnostic catheter.  Femoral arterial puncture  was performed and Omnipaque contrast was used.  The left femoral artery was  closed with AngioSeal at the end of the procedure.  The patient tolerated  the procedure well and left the laboratory in satisfactory condition.    RESULTS:  The stent in the proximal circumflex artery which crossed the  first marginal Justin Lyons was widely patent with no stenosis.  The first  marginal Justin Lyons was narrowed at the ostium to 70% to 80%.  This appeared  better than on the immediate post-stenting film from yesterday.  There was  also a 30% to 40% narrowing downstream in the circumflex artery.    CONCLUSION:  Widely patent stent in the proximal circumflex artery with 80%  narrowing in a side Justin Lyons, but with brisk TIMI-3 flow.     RECOMMENDATIONS:  The stent is widely patent and the side Justin Lyons has brisk  TIMI flow and the ostial stenosis appears better than the post-procedure  films.  I think it is very unlikely that the patient's symptoms are related  to the marginal Justin Lyons narrowing.  His symptoms may be related to his Justin Lyons  and we will plan to treat him with Justin Lyons and continued observation.     08/2003 cath COMPLICATIONS:  None.    FINDINGS:  1. LV 114/5/13.  EF approximately 60% without regional wall motion     abnormality in an RAO projection.  2. No aortic stenosis or mitral regurgitation.  3. Left main:  Angiographically normal.  4. LAD:  Large vessel wrapping the apex.  There is a 50% stenosis of the     proximal vessel.  5. Circumflex:  Hazy, approximately 80% stenosis of the proximal vessel     involving the takeoff of moderate sized first obtuse marginal.  This     first marginal had an 80% stenosis.  The AV groove circumflex was stented     to no residual stenosis.  There remained a 70% stenosis in the obtuse     marginal.  TIMI 3 flow was maintained in both the circumflex proper and     the first obtuse marginal.  6. RCA:  Moderate sized, dominant vessel.  There is a total occlusion of the     mid vessel after the takeoff of the acute marginal.  There are bridging     collaterals and modest left to right collaterals.  The bridging     collaterals are very suggestive of chronic total occlusion.  Films are     reviewed with Dr. Juanda Chance who agreed.    IMPRESSION/RECOMMENDATIONS:  Successful drug-eluting stent placement in the  culprit lesion of the proximal circumflex.  The patient tolerated the  procedure well.  Justin Lyons will be continued for 18 hours.  Sheaths will  be removed when the ACT is  less than 175 seconds.    The patient has agreed to participate in the TRITON study comparing Justin Lyons  with a novel thienopyridine.  He will be continued on this study drug for  approximately one  year.  He, thus, should not receive any Justin Lyons through  that period.  Aspirin will be continued indefinitely.   11/2019 carotid US Summary:  Right Carotid: Velocities in the right ICA are consistent with a 1-39%  stenosis.   Left Carotid: Velocities in the left ICA are consistent with a 1-39%  stenosis.   Vertebrals:  Right vertebral artery demonstrates antegrade flow. Left  vertebral               artery demonstrates retrograde flow.  Subclavians: Left subclavian artery was stenotic. Normal flow hemodynamics  were               seen in the right subclavian artery.    05/2021 echo 1. Left ventricular ejection fraction, by estimation, is 60 to 65%. The  left ventricle has normal function. The left ventricle has no regional  wall motion abnormalities. There is mild left ventricular hypertrophy of  the basal-septal segment. Left  ventricular diastolic parameters are consistent with Grade I diastolic  dysfunction (impaired relaxation).   2. Right ventricular systolic function is normal. The right ventricular  size is normal.   3. The mitral valve is normal in structure. Mild mitral valve  regurgitation. No evidence of mitral stenosis.   4. The aortic valve is tricuspid. Aortic valve regurgitation is trivial.  No aortic stenosis is present.    Assessment and Plan  1. CAD -no chest pains, some recent progressing SOB/DOE - obtain echo, pending results likely pursue exercise nuclear stress.  - EKG today shows SR, chronic ST/T changes   2. HTN - at goal, continue current meds   3. Hyperlipidemia - at goal, continue current meds   4.PAF/acquired thrombophilia - no symptoms, continue current meds incliuding eliquis for stroke prevention   Antoine Poche, M.D.,

## 2022-11-20 NOTE — Addendum Note (Signed)
Addended by: Lesle Chris on: 11/20/2022 09:11 AM   Modules accepted: Orders

## 2022-11-20 NOTE — Patient Instructions (Signed)
Medication Instructions:  Continue all current medications.  Labwork: none  Testing/Procedures: Your physician has requested that you have an echocardiogram. Echocardiography is a painless test that uses sound waves to create images of your heart. It provides your doctor with information about the size and shape of your heart and how well your heart's chambers and valves are working. This procedure takes approximately one hour. There are no restrictions for this procedure. Please do NOT wear cologne, perfume, aftershave, or lotions (deodorant is allowed). Please arrive 15 minutes prior to your appointment time. Office will contact with results via phone, letter or mychart.     Follow-Up: 6 months   Any Other Special Instructions Will Be Listed Below (If Applicable).   If you need a refill on your cardiac medications before your next appointment, please call your pharmacy.  

## 2022-11-22 NOTE — Progress Notes (Signed)
Carelink Summary Report / Loop Recorder 

## 2022-11-29 ENCOUNTER — Other Ambulatory Visit: Payer: Medicare HMO

## 2022-12-04 ENCOUNTER — Ambulatory Visit: Payer: Medicare HMO | Attending: Cardiology

## 2022-12-04 DIAGNOSIS — R0602 Shortness of breath: Secondary | ICD-10-CM | POA: Diagnosis not present

## 2022-12-04 LAB — ECHOCARDIOGRAM COMPLETE
AR max vel: 3.38 cm2
AV Area VTI: 3.66 cm2
AV Area mean vel: 3.61 cm2
AV Mean grad: 3 mm[Hg]
AV Peak grad: 5.7 mm[Hg]
Ao pk vel: 1.19 m/s
Area-P 1/2: 3.13 cm2
Calc EF: 63.7 %
MV M vel: 5.78 m/s
MV Peak grad: 133.6 mm[Hg]
MV VTI: 2.94 cm2
P 1/2 time: 443 ms
S' Lateral: 2.8 cm
Single Plane A2C EF: 68.8 %
Single Plane A4C EF: 58.2 %

## 2022-12-10 ENCOUNTER — Ambulatory Visit (INDEPENDENT_AMBULATORY_CARE_PROVIDER_SITE_OTHER): Payer: Medicare HMO

## 2022-12-10 DIAGNOSIS — I639 Cerebral infarction, unspecified: Secondary | ICD-10-CM

## 2022-12-10 LAB — CUP PACEART REMOTE DEVICE CHECK
Date Time Interrogation Session: 20241117232505
Implantable Pulse Generator Implant Date: 20230517

## 2022-12-31 DIAGNOSIS — D529 Folate deficiency anemia, unspecified: Secondary | ICD-10-CM | POA: Diagnosis not present

## 2022-12-31 DIAGNOSIS — E7849 Other hyperlipidemia: Secondary | ICD-10-CM | POA: Diagnosis not present

## 2022-12-31 DIAGNOSIS — J449 Chronic obstructive pulmonary disease, unspecified: Secondary | ICD-10-CM | POA: Diagnosis not present

## 2022-12-31 DIAGNOSIS — E782 Mixed hyperlipidemia: Secondary | ICD-10-CM | POA: Diagnosis not present

## 2022-12-31 DIAGNOSIS — Z6823 Body mass index (BMI) 23.0-23.9, adult: Secondary | ICD-10-CM | POA: Diagnosis not present

## 2022-12-31 DIAGNOSIS — I10 Essential (primary) hypertension: Secondary | ICD-10-CM | POA: Diagnosis not present

## 2022-12-31 DIAGNOSIS — Z23 Encounter for immunization: Secondary | ICD-10-CM | POA: Diagnosis not present

## 2022-12-31 DIAGNOSIS — I771 Stricture of artery: Secondary | ICD-10-CM | POA: Diagnosis not present

## 2022-12-31 DIAGNOSIS — Z0000 Encounter for general adult medical examination without abnormal findings: Secondary | ICD-10-CM | POA: Diagnosis not present

## 2023-01-04 NOTE — Progress Notes (Signed)
Carelink Summary Report / Loop Recorder 

## 2023-01-07 DIAGNOSIS — H903 Sensorineural hearing loss, bilateral: Secondary | ICD-10-CM | POA: Diagnosis not present

## 2023-01-11 ENCOUNTER — Telehealth: Payer: Self-pay | Admitting: *Deleted

## 2023-01-11 NOTE — Telephone Encounter (Signed)
Notified, copy to pcp.

## 2023-01-11 NOTE — Telephone Encounter (Signed)
-----   Message from [facility] [place] [facility] sent at 01/07/2023  7:20 AM EST -----  ----- Message ----- From: Antoine Poche, MD Sent: 01/06/2023   9:35 AM EST To: Sharen Hones  Echo shows normal heart pumping function. He has some mild age related stifness of the heart which is common and considered a minor finding. Overall echo looks good  Dominga Ferry MD

## 2023-01-14 ENCOUNTER — Ambulatory Visit (INDEPENDENT_AMBULATORY_CARE_PROVIDER_SITE_OTHER): Payer: Medicare HMO

## 2023-01-14 DIAGNOSIS — I639 Cerebral infarction, unspecified: Secondary | ICD-10-CM | POA: Diagnosis not present

## 2023-01-15 LAB — CUP PACEART REMOTE DEVICE CHECK
Date Time Interrogation Session: 20241222231146
Implantable Pulse Generator Implant Date: 20230517

## 2023-01-22 DIAGNOSIS — I10 Essential (primary) hypertension: Secondary | ICD-10-CM | POA: Diagnosis not present

## 2023-01-22 DIAGNOSIS — M542 Cervicalgia: Secondary | ICD-10-CM | POA: Diagnosis not present

## 2023-01-22 DIAGNOSIS — J449 Chronic obstructive pulmonary disease, unspecified: Secondary | ICD-10-CM | POA: Diagnosis not present

## 2023-01-29 ENCOUNTER — Encounter: Payer: Self-pay | Admitting: Cardiology

## 2023-02-14 ENCOUNTER — Other Ambulatory Visit: Payer: Self-pay | Admitting: *Deleted

## 2023-02-14 ENCOUNTER — Telehealth: Payer: Self-pay | Admitting: Cardiology

## 2023-02-14 MED ORDER — APIXABAN 5 MG PO TABS: ORAL_TABLET | ORAL | 1 refills | Status: DC

## 2023-02-14 NOTE — Telephone Encounter (Signed)
Prescription refill request for Eliquis received. Indication: PAF Last office visit: 11/20/22  Dominga Ferry MD Scr: 1.28 on 12/31/22  [facility] Age: 88 Weight: 69.3kg  Based on above findings Eliquis 5mg  twice daily is the appropriate dose.  Refill approved.

## 2023-02-14 NOTE — Telephone Encounter (Signed)
Pt c/o BP issue: STAT if pt c/o blurred vision, one-sided weakness or slurred speech  1. What are your last 5 BP readings?  189/98, 175/96, 147/57, 190,78, 154/89  2. Are you having any other symptoms (ex. Dizziness, headache, blurred vision, passed out)? Headaches sometimes  3. What is your BP issue? Patient's daughter says the Metoprolol does not seem to be helping a lot

## 2023-02-18 ENCOUNTER — Ambulatory Visit (INDEPENDENT_AMBULATORY_CARE_PROVIDER_SITE_OTHER): Payer: Medicare HMO

## 2023-02-18 DIAGNOSIS — I639 Cerebral infarction, unspecified: Secondary | ICD-10-CM

## 2023-02-18 LAB — CUP PACEART REMOTE DEVICE CHECK
Date Time Interrogation Session: 20250126231735
Implantable Pulse Generator Implant Date: 20230517

## 2023-02-19 DIAGNOSIS — H524 Presbyopia: Secondary | ICD-10-CM | POA: Diagnosis not present

## 2023-02-19 DIAGNOSIS — H52223 Regular astigmatism, bilateral: Secondary | ICD-10-CM | POA: Diagnosis not present

## 2023-02-19 DIAGNOSIS — H01004 Unspecified blepharitis left upper eyelid: Secondary | ICD-10-CM | POA: Diagnosis not present

## 2023-02-19 DIAGNOSIS — H5213 Myopia, bilateral: Secondary | ICD-10-CM | POA: Diagnosis not present

## 2023-02-19 DIAGNOSIS — Z961 Presence of intraocular lens: Secondary | ICD-10-CM | POA: Diagnosis not present

## 2023-02-19 DIAGNOSIS — H01001 Unspecified blepharitis right upper eyelid: Secondary | ICD-10-CM | POA: Diagnosis not present

## 2023-02-21 DIAGNOSIS — Z6823 Body mass index (BMI) 23.0-23.9, adult: Secondary | ICD-10-CM | POA: Diagnosis not present

## 2023-02-21 DIAGNOSIS — I10 Essential (primary) hypertension: Secondary | ICD-10-CM | POA: Diagnosis not present

## 2023-02-25 NOTE — Addendum Note (Signed)
Addended by: Geralyn Flash D on: 02/25/2023 11:17 AM   Modules accepted: Orders

## 2023-02-25 NOTE — Progress Notes (Signed)
 [facility] Summary Report / Loop Recorder

## 2023-03-07 DIAGNOSIS — Z6823 Body mass index (BMI) 23.0-23.9, adult: Secondary | ICD-10-CM | POA: Diagnosis not present

## 2023-03-07 DIAGNOSIS — I10 Essential (primary) hypertension: Secondary | ICD-10-CM | POA: Diagnosis not present

## 2023-03-25 ENCOUNTER — Ambulatory Visit (INDEPENDENT_AMBULATORY_CARE_PROVIDER_SITE_OTHER): Payer: Medicare HMO

## 2023-03-25 DIAGNOSIS — I639 Cerebral infarction, unspecified: Secondary | ICD-10-CM | POA: Diagnosis not present

## 2023-03-25 LAB — CUP PACEART REMOTE DEVICE CHECK
Date Time Interrogation Session: 20250302231055
Implantable Pulse Generator Implant Date: 20230517

## 2023-03-29 ENCOUNTER — Encounter: Payer: Self-pay | Admitting: Cardiology

## 2023-04-01 NOTE — Progress Notes (Signed)
 [facility] Summary Report / Loop Recorder

## 2023-04-04 DIAGNOSIS — Z6823 Body mass index (BMI) 23.0-23.9, adult: Secondary | ICD-10-CM | POA: Diagnosis not present

## 2023-04-04 DIAGNOSIS — J449 Chronic obstructive pulmonary disease, unspecified: Secondary | ICD-10-CM | POA: Diagnosis not present

## 2023-04-04 DIAGNOSIS — I10 Essential (primary) hypertension: Secondary | ICD-10-CM | POA: Diagnosis not present

## 2023-04-04 DIAGNOSIS — R140 Abdominal distension (gaseous): Secondary | ICD-10-CM | POA: Diagnosis not present

## 2023-04-29 ENCOUNTER — Ambulatory Visit (INDEPENDENT_AMBULATORY_CARE_PROVIDER_SITE_OTHER): Payer: Medicare HMO

## 2023-04-29 DIAGNOSIS — I639 Cerebral infarction, unspecified: Secondary | ICD-10-CM

## 2023-04-29 NOTE — Progress Notes (Signed)
 [facility] Summary Report / Loop Recorder

## 2023-04-30 LAB — CUP PACEART REMOTE DEVICE CHECK
Date Time Interrogation Session: 20250406231233
Implantable Pulse Generator Implant Date: 20230517

## 2023-05-06 DIAGNOSIS — R140 Abdominal distension (gaseous): Secondary | ICD-10-CM | POA: Diagnosis not present

## 2023-05-06 DIAGNOSIS — I10 Essential (primary) hypertension: Secondary | ICD-10-CM | POA: Diagnosis not present

## 2023-05-06 DIAGNOSIS — Z6823 Body mass index (BMI) 23.0-23.9, adult: Secondary | ICD-10-CM | POA: Diagnosis not present

## 2023-06-03 ENCOUNTER — Ambulatory Visit (INDEPENDENT_AMBULATORY_CARE_PROVIDER_SITE_OTHER): Payer: Medicare HMO

## 2023-06-03 DIAGNOSIS — I639 Cerebral infarction, unspecified: Secondary | ICD-10-CM | POA: Diagnosis not present

## 2023-06-04 LAB — CUP PACEART REMOTE DEVICE CHECK
Date Time Interrogation Session: 20250511234050
Implantable Pulse Generator Implant Date: 20230517

## 2023-06-05 ENCOUNTER — Ambulatory Visit: Payer: Self-pay | Admitting: Cardiology

## 2023-06-19 NOTE — Addendum Note (Signed)
 Addended by: Edra Govern D on: 06/19/2023 01:50 PM   Modules accepted: Orders

## 2023-06-19 NOTE — Progress Notes (Signed)
 [facility] Summary Report / Loop Recorder

## 2023-06-26 ENCOUNTER — Ambulatory Visit: Attending: Nurse Practitioner | Admitting: Nurse Practitioner

## 2023-06-26 ENCOUNTER — Encounter: Payer: Self-pay | Admitting: Nurse Practitioner

## 2023-06-26 VITALS — BP 122/72 | HR 64 | Ht 70.0 in | Wt 155.2 lb

## 2023-06-26 DIAGNOSIS — I480 Paroxysmal atrial fibrillation: Secondary | ICD-10-CM

## 2023-06-26 DIAGNOSIS — I771 Stricture of artery: Secondary | ICD-10-CM

## 2023-06-26 DIAGNOSIS — Z8673 Personal history of transient ischemic attack (TIA), and cerebral infarction without residual deficits: Secondary | ICD-10-CM

## 2023-06-26 DIAGNOSIS — I10 Essential (primary) hypertension: Secondary | ICD-10-CM

## 2023-06-26 DIAGNOSIS — Z9889 Other specified postprocedural states: Secondary | ICD-10-CM | POA: Diagnosis not present

## 2023-06-26 DIAGNOSIS — I2510 Atherosclerotic heart disease of native coronary artery without angina pectoris: Secondary | ICD-10-CM | POA: Diagnosis not present

## 2023-06-26 DIAGNOSIS — R0609 Other forms of dyspnea: Secondary | ICD-10-CM

## 2023-06-26 DIAGNOSIS — E785 Hyperlipidemia, unspecified: Secondary | ICD-10-CM

## 2023-06-26 DIAGNOSIS — I6523 Occlusion and stenosis of bilateral carotid arteries: Secondary | ICD-10-CM

## 2023-06-26 DIAGNOSIS — J449 Chronic obstructive pulmonary disease, unspecified: Secondary | ICD-10-CM

## 2023-06-26 DIAGNOSIS — R0602 Shortness of breath: Secondary | ICD-10-CM

## 2023-06-26 DIAGNOSIS — Z8679 Personal history of other diseases of the circulatory system: Secondary | ICD-10-CM

## 2023-06-26 NOTE — Patient Instructions (Addendum)
 Medication Instructions:  Your physician recommends that you continue on your current medications as directed. Please refer to the Current Medication list given to you today.  Labwork: None   Testing/Procedures: Your physician has recommended that you have a pulmonary function test. Pulmonary Function Tests are a group of tests that measure how well air moves in and out of your lungs.  Follow-Up: Your physician recommends that you schedule a follow-up appointment in: 3-4 months   Any Other Special Instructions Will Be Listed Below (If Applicable).  If you need a refill on your cardiac medications before your next appointment, please call your pharmacy.

## 2023-06-26 NOTE — Progress Notes (Signed)
 Cardiology Office Note   Date:  06/26/2023 ID:  Justin Lyons, DOB 06-Jul-1935, MRN [id number] PCP: Orest Bio, MD  [facility] Providers Cardiologist:  Armida Lander, MD     History of Present Illness Justin Lyons is a 88 y.o. male with a PMH of CAD, hypertension, hyperlipidemia, PAF, history of AAA, s/p endovascular repair, COPD, and carotid artery stenosis with left subclavian stenosis, and past history of CVA, who presents today for scheduled follow-up.  Remote history of DES to left circumflex in 2005.  Also remote history of PTCA to RCA.  Followed by EP due to history of loop recorder.  Last seen by Dr. Armida Lander on November 20, 2022.  At that time, patient noticed some recent progression in shortness of breath/DOE.  Echocardiogram was obtained and was benign.  Recommended that pending results would likely pursue exercise nuclear stress test.  Today he presents for scheduled follow-up.  He states he continues to note stable [facility] and DOE. Denies any chest pain, palpitations, syncope, presyncope, dizziness, orthopnea, PND, swelling or significant weight changes, acute bleeding, or claudication.    ROS: Negative. See HPI. SH: Enjoys Hydrologist work. 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   Studies Reviewed     EKG: EKG is not ordered today.   Echo 11/2022:  1. Left ventricular ejection fraction, by estimation, is 55 to 60%. The  left ventricle has normal function. The left ventricle has no regional  wall motion abnormalities. There is mild left ventricular hypertrophy.  Left ventricular diastolic parameters  are consistent with Grade I diastolic dysfunction (impaired relaxation).  Normal LVEDP.   2. Right ventricular systolic function is normal. The right ventricular  size is normal.   3. The mitral valve is normal in structure. Mild mitral valve  regurgitation. No evidence of mitral stenosis.   4. The aortic valve is tricuspid. Aortic  valve regurgitation is mild. No  aortic stenosis is present.   5. The inferior vena cava is normal in size but collapsibility could not  be evaluated.   Comparison(s): No significant change from prior study.  Carotid duplex 06/2022:  Summary:  Right Carotid: Velocities in the right ICA are consistent with a 1-39%  stenosis.   Left Carotid: Velocities in the left ICA are consistent with a 1-39%  stenosis.   Vertebrals: Right vertebral artery demonstrates antegrade flow. Left  vertebral artery demonstrates retrograde flow.  Subclavians: Left subclavian artery flow was disturbed. Normal flow  hemodynamics were seen in the right subclavian artery.   *See table(s) above for measurements and observations.  Vascular ultrasound US  EVAR duplex 06/2022:  Summary:  Abdominal Aorta: Patent endovascular aneurysm repair with no evidence of  endoleak. The largest aortic diameter has decreased compared to prior  exam. Previous diameter measurement was 4.8 cm obtained on 06/12/2021.    Physical Exam VS:  BP 122/72   Pulse 64   Ht 5\' 10"  (1.778 m)   Wt 155 lb 3.2 oz (70.4 kg)   SpO2 95%   BMI 22.27 kg/m    Wt Readings from Last 3 Encounters:  06/26/23 155 lb 3.2 oz (70.4 kg)  11/20/22 152 lb 12.8 oz (69.3 kg)  07/02/22 150 lb (68 kg)    GEN: Well nourished, well developed in no acute distress NECK: No JVD; No carotid bruits CARDIAC: S1/S2, RRR, no murmurs, rubs, gallops RESPIRATORY:  Clear to auscultation without rales, wheezing or rhonchi  ABDOMEN: Soft, non-tender, non-distended EXTREMITIES:  No edema; No  deformity   ASSESSMENT AND PLAN  CAD Remote history of DES to left circumflex in 2005.  Also remote history of PTCA to RCA. Denies any chest pain, does admit to DOE.  Did discuss ischemic evaluation and patient politely declines at this time.  He is not on aspirin  due to being on Eliquis .  Continue current medication regimen. Heart healthy diet and regular cardiovascular exercise  encouraged.   PAF Denies any tachycardia or palpitations.  Heart rates well-controlled today.  Continue Lopressor .  Continue Eliquis  5 mg twice daily for stroke prevention.  He is on appropriate dosage denies any bleeding issues. Heart healthy diet and regular cardiovascular exercise encouraged.   HTN Blood pressure stable. Discussed to monitor BP at home at least 2 hours after medications and sitting for 5-10 minutes.  No medication changes at this time. Heart healthy diet and regular cardiovascular exercise encouraged.   HLD LDL 58 in December 2024.  Continue current medication regimen. Heart healthy diet and regular cardiovascular exercise encouraged.  Continue to follow with PCP.  AAA, s/p endovascular repair Followed closely by vascular surgery.  Duplex in June 2024 revealed patent endovascular aneurysm repair with no evidence of endoleak, the largest aortic diameter had decreased compared to previous exam.  Continue to follow-up with [facility] who is managing this.   Carotid artery stenosis with left subclavian stenosis Followed closely by vascular surgery.  Carotid duplex in June 2024 revealed 1 to 39% stenosis along bilateral ICAs with left vertebral artery demonstrating retrograde flow in his left subclavian artery flow was disturbed, normal flow hemodynamics are seen in the right subclavian artery.  Denies any concerning signs or symptoms.  Continue follow-up with VVS.  Hx of CVA Denies any recent issues.  No medication changes at this time.  Continue follow-up with PCP.  COPD, DOE Does admit to some stable and continued shortness of breath with exertion.  Did discuss treatment options and patient declines ischemic evaluation at this time.  Did discuss pulmonary function test and he is agreeable to this.  Will arrange.  Continue follow-up with PCP.  Care and ED precautions discussed.      Dispo: Follow-up with me/APP in 3 to 4 months or sooner if any changes.  Signed, Lasalle Pointer,  NP

## 2023-07-04 ENCOUNTER — Ambulatory Visit (INDEPENDENT_AMBULATORY_CARE_PROVIDER_SITE_OTHER)

## 2023-07-04 DIAGNOSIS — I639 Cerebral infarction, unspecified: Secondary | ICD-10-CM | POA: Diagnosis not present

## 2023-07-04 DIAGNOSIS — Z8673 Personal history of transient ischemic attack (TIA), and cerebral infarction without residual deficits: Secondary | ICD-10-CM

## 2023-07-04 LAB — CUP PACEART REMOTE DEVICE CHECK
Date Time Interrogation Session: 20250611232118
Implantable Pulse Generator Implant Date: 20230517

## 2023-07-09 ENCOUNTER — Ambulatory Visit: Payer: Self-pay | Admitting: Cardiology

## 2023-07-10 ENCOUNTER — Other Ambulatory Visit: Payer: Self-pay | Admitting: Surgery

## 2023-07-10 DIAGNOSIS — I714 Abdominal aortic aneurysm, without rupture, unspecified: Secondary | ICD-10-CM

## 2023-07-10 DIAGNOSIS — I739 Peripheral vascular disease, unspecified: Secondary | ICD-10-CM

## 2023-07-10 DIAGNOSIS — Z95828 Presence of other vascular implants and grafts: Secondary | ICD-10-CM

## 2023-07-15 DIAGNOSIS — I10 Essential (primary) hypertension: Secondary | ICD-10-CM | POA: Diagnosis not present

## 2023-07-15 DIAGNOSIS — Z6823 Body mass index (BMI) 23.0-23.9, adult: Secondary | ICD-10-CM | POA: Diagnosis not present

## 2023-07-15 DIAGNOSIS — J449 Chronic obstructive pulmonary disease, unspecified: Secondary | ICD-10-CM | POA: Diagnosis not present

## 2023-07-22 DIAGNOSIS — I10 Essential (primary) hypertension: Secondary | ICD-10-CM | POA: Diagnosis not present

## 2023-07-22 DIAGNOSIS — J449 Chronic obstructive pulmonary disease, unspecified: Secondary | ICD-10-CM | POA: Diagnosis not present

## 2023-07-23 NOTE — Progress Notes (Signed)
 [facility] Summary Report / Loop Recorder

## 2023-07-23 NOTE — Addendum Note (Signed)
 Addended by: TAWNI DRILLING D on: 07/23/2023 01:03 PM   Modules accepted: Orders

## 2023-07-29 ENCOUNTER — Encounter: Payer: Self-pay | Admitting: Physician Assistant

## 2023-07-29 ENCOUNTER — Ambulatory Visit (HOSPITAL_COMMUNITY)
Admission: RE | Admit: 2023-07-29 | Discharge: 2023-07-29 | Disposition: A | Discharge status: Home / Self Care | Source: Ambulatory Visit | Attending: Surgery | Admitting: Surgery

## 2023-07-29 ENCOUNTER — Ambulatory Visit (INDEPENDENT_AMBULATORY_CARE_PROVIDER_SITE_OTHER): Admitting: Physician Assistant

## 2023-07-29 VITALS — BP 178/78 | HR 55 | Temp 97.7°F | Ht 70.0 in | Wt 154.1 lb

## 2023-07-29 DIAGNOSIS — Z95828 Presence of other vascular implants and grafts: Secondary | ICD-10-CM | POA: Insufficient documentation

## 2023-07-29 DIAGNOSIS — I714 Abdominal aortic aneurysm, without rupture, unspecified: Secondary | ICD-10-CM | POA: Diagnosis not present

## 2023-07-29 DIAGNOSIS — M7989 Other specified soft tissue disorders: Secondary | ICD-10-CM | POA: Diagnosis not present

## 2023-07-29 DIAGNOSIS — I739 Peripheral vascular disease, unspecified: Secondary | ICD-10-CM | POA: Insufficient documentation

## 2023-07-29 NOTE — Progress Notes (Signed)
 HISTORY AND PHYSICAL     CC:  follow up. For EVAR Requesting Provider:  Atilano Deward ORN, MD  HPI: This is a 88 y.o. male who is here today for follow up for AAA and is s/p EVAR on 04/07/2017 for symptomatic 5.4cm AAA by Dr. Serene.  Pt was last seen 07/02/2022 and at that time, he was not having any concerns or new abdominal or back pain.  He had 1-39% bilateral ICA stenosis of 1-39% and was scheduled for 2 year follow up duplex.   He has hx of CAD with remote hx of DES to left Cx in 2005 and PTCA to RCA also in the remote past.   The pt returns today with his daughter for follow up studies. He states that he has not had any abdominal or back pain.  He does have some right leg swelling that has been going on for a few weeks.  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 He states the swelling is better in the morning time after being in bed.  He does not wear compression socks.   He does have some shortness of breath with activity but this is not new.  He saw cardiology last month and they are supposed to be setting up pulmonary studies.  He denies any chest pain.  He does not have hx of DVT.  He is compliant with his Eliquis .  He was put on this after he had his stroke.  He states they took him off Plavix  and started Eliquis .    The pt is on a statin for cholesterol management.    The pt is not on an aspirin .    Other AC:  Eliquis  The pt is on BB, ACEI for hypertension.  The pt is not on medication for diabetes. Tobacco hx:  former   Past Medical History:  Diagnosis Date   AAA (abdominal aortic aneurysm) (HCC)    06/02/2003 old BPG   Arthritis    Carotid artery occlusion    Carotid bruit    COPD (chronic obstructive pulmonary disease) (HCC)    Coronary artery disease    Diverticulosis of colon    Erectile dysfunction    Hemorrhoids, internal    History of compression fracture of spine    History of gout    Hyperlipidemia     Hypertension    Knee pain    Myocardial infarction [facility]) 2005   Peripheral vascular disease (HCC)     Past Surgical History:  Procedure Laterality Date   ABDOMINAL AORTIC ENDOVASCULAR STENT GRAFT N/A 04/07/2017   Procedure: ENDOVASCULAR ABDOMINAL ANEURYSM REPAIR;  Surgeon: Serene Gaile ORN, MD;  Location: [facility] OR;  Service: Vascular;  Laterality: N/A;   CORONARY ANGIOPLASTY  2005   LOOP RECORDER INSERTION N/A 06/07/2021   Procedure: LOOP RECORDER INSERTION;  Surgeon: Inocencio Soyla Lunger, MD;  Location: [facility] INVASIVE CV LAB;  Service: Cardiovascular;  Laterality: N/A;    Allergies  Allergen Reactions   Penicillins Rash   Sulfonamide Derivatives Rash    Current Outpatient Medications  Medication Sig Dispense Refill   albuterol  (PROVENTIL ) (2.5 MG/3ML) 0.083% nebulizer solution Take 2.5 mg by nebulization every 6 (six) hours as needed for wheezing or shortness of breath.     albuterol  (VENTOLIN  HFA) 108 (90 Base) MCG/ACT inhaler Inhale 1-2 puffs into the lungs every 6 (six) hours as needed for wheezing or shortness of breath.     allopurinol  (  ZYLOPRIM ) 100 MG tablet Take 100 mg by mouth daily.     apixaban  (ELIQUIS ) 5 MG TABS tablet TAKE 1 TABLET TWICE DAILY (DISCONTINUE PLAVIX ) 180 tablet 1   Cholecalciferol  (VITAMIN D ) 1000 UNITS capsule Take 1,000 Units by mouth daily.     diclofenac  (VOLTAREN ) 75 MG EC tablet Take 1 tablet by mouth daily.     lisinopril  (ZESTRIL ) 20 MG tablet Take 20 mg by mouth 2 (two) times daily.     metoprolol  tartrate (LOPRESSOR ) 25 MG tablet Take 25 mg by mouth 2 (two) times daily.      nitroGLYCERIN  (NITROSTAT ) 0.4 MG SL tablet Place 1 tablet (0.4 mg total) under the tongue every 5 (five) minutes as needed. (Patient taking differently: Place 0.4 mg under the tongue every 5 (five) minutes x 3 doses as needed.) 25 tablet 3   pantoprazole  (PROTONIX ) 40 MG tablet Take 1 tablet (40 mg total) by mouth daily. 90 tablet 3   rosuvastatin (CRESTOR) 40 MG tablet Take 40 mg  by mouth daily.     No current facility-administered medications for this visit.    Family History  Problem Relation Age of Onset   Heart disease Father        Heart Disease before age 69   Heart attack Father    Cancer Mother        Colon   Heart disease Mother    Hyperlipidemia Mother    Hypertension Mother    Heart attack Mother    Colon cancer Mother    Cancer Brother        lung cancer    Social History   Socioeconomic History   Marital status: Single    Spouse name: Not on file   Number of children: Not on file   Years of education: Not on file   Highest education level: Not on file  Occupational History   Occupation: part time in a pharmacy    Employer: RETIRED  Tobacco Use   Smoking status: Former    Current packs/day: 0.00    Average packs/day: 1.5 packs/day for 20.0 years (30.0 ttl pk-yrs)    Types: Cigarettes    Start date: 01/22/1962    Quit date: 01/22/1982    Years since quitting: 41.5   Smokeless tobacco: Never   Tobacco comments:    Former smoker 10/18/21  Vaping Use   Vaping status: Never Used  Substance and Sexual Activity   Alcohol use: No   Drug use: No   Sexual activity: Not on file  Other Topics Concern   Not on file  Social History Narrative   Not on file   Social Drivers of Health   Financial Resource Strain: Not on file  Food Insecurity: No Food Insecurity (10/12/2021)   Hunger Vital Sign    Worried About Running Out of Food in the Last Year: Never true    Ran Out of Food in the Last Year: Never true  Transportation Needs: No Transportation Needs (10/12/2021)   PRAPARE - Administrator, Civil Service (Medical): No    Lack of Transportation (Non-Medical): No  Physical Activity: Not on file  Stress: Not on file  Social Connections: Not on file  Intimate Partner Violence: Not on file     REVIEW OF SYSTEMS:   [X]  denotes positive finding, [ ]  denotes negative finding Cardiac  Comments:  Chest pain or chest pressure:     Shortness of breath upon exertion:    Short of breath  when lying flat:    Irregular heart rhythm:        Vascular    Pain in calf, thigh, or hip brought on by ambulation:    Pain in feet at night that wakes you up from your sleep:     Blood clot in your veins:    Leg swelling:         Pulmonary    Oxygen at home:    Productive cough:     Wheezing:         Neurologic    Sudden weakness in arms or legs:     Sudden numbness in arms or legs:     Sudden onset of difficulty speaking or slurred speech:    Temporary loss of vision in one eye:     Problems with dizziness:         Gastrointestinal    Blood in stool:     Vomited blood:         Genitourinary    Burning when urinating:     Blood in urine:        Psychiatric    Major depression:         Hematologic    Bleeding problems:    Problems with blood clotting too easily:        Skin    Rashes or ulcers:        Constitutional    Fever or chills:      PHYSICAL EXAMINATION:  Today's Vitals   07/29/23 0914  BP: (!) 178/78  Pulse: (!) 55  Temp: 97.7 F (36.5 C)  TempSrc: Temporal  SpO2: 92%  Weight: 154 lb 1.6 oz (69.9 kg)  Height: 5' 10 (1.778 m)  PainSc: 0-No pain   Body mass index is 22.11 kg/m.   General:  WDWN in NAD; vital signs documented above Gait: Not observed HENT: WNL, normocephalic Pulmonary: normal non-labored breathing  Cardiac: regular HR;  without carotid bruits Abdomen: soft, NT; aortic pulse is not palpable Skin: without rashes Vascular Exam/Pulses:  Right Left  Radial 2+ (normal) 2+ (normal)  PT 2+ (normal) 2+ (normal)   Extremities: without open wounds; right leg larger than left leg.  Bilateral calves are soft and non tender.   Musculoskeletal: no muscle wasting or atrophy  Neurologic: A&O X 3 Psychiatric:  The pt has Normal affect.   Non-Invasive Vascular Imaging:   EVAR Arterial duplex on 07/29/2023: Endovascular Aortic Repair (EVAR):   +----------+----------------+-------------------+-------------------+           Diameter AP (cm)Diameter Trans (cm)Velocities (cm/sec)  +----------+----------------+-------------------+-------------------+  Aorta    3.60            3.90               40                   +----------+----------------+-------------------+-------------------+  Right Limb1.60            1.80               88                   +----------+----------------+-------------------+-------------------+  Left Limb 1.40            1.50               48                   +----------+----------------+-------------------+-------------------+   Summary:  Abdominal Aorta: The  largest aortic diameter has decreased compared to prior exam. Previous diameter measurement was 4.1 cm obtained on 07/02/22.  Stenosis: Patent endovascular aneurysm repair with no evidence of endoleak.    Previous EVAR arterial duplex on 07/02/2022: Endovascular Aortic Repair (EVAR):  +----------+----------------+-------------------+-------------------+           Diameter AP (cm)Diameter Trans (cm)Velocities (cm/sec)  +----------+----------------+-------------------+-------------------+  Aorta    4.06            3.87                                    +----------+----------------+-------------------+-------------------+  Right Limb1.58            1.64               156                  +----------+----------------+-------------------+-------------------+  Left Limb 1.29            1.45               82                   +----------+----------------+-------------------+-------------------+   Summary:  Abdominal Aorta: Patent endovascular aneurysm repair with no evidence of endoleak. The largest aortic diameter has decreased compared to prior exam. Previous diameter measurement was 4.8 cm obtained on 06/12/2021   Carotid duplex on 07/02/2022: Bilateral 1-39% ICA stenosis   ASSESSMENT/PLAN:: 88 y.o. male here  with hx of  EVAR on 04/07/2017 for symptomatic 5.4cm AAA by Dr. Serene.  AAA -aortic diameter has decreased in size.  He does not have any abdominal or back pain.   -continue statin.  Pt is on Eliquis  -pt will f/u in one year with EVAR duplex.  Leg swelling -pt with hx of right leg larger than left leg due to hx of Polio.  His legs are non tender and swelling improves with elevation over night.  He does not have any chest pain.  He does have DOE and plan for pulmonary studies that cardiology is scheduling.  Do not suspect DVT.  He is on eliquis  and has not missed any doses.  He is  not interested in wearing compression socks.  He agrees to elevate his legs for a few minutes every day.    Carotid stenosis -denies any new neurological sx.  Repeat carotid duplex in one year -pt on Eliquis  for hx of stroke in the past.  Per chart, he also has hx of PAF.     Lucie Apt, [facility] [phone]  Clinic MD:   Serene

## 2023-08-05 ENCOUNTER — Ambulatory Visit

## 2023-08-05 ENCOUNTER — Ambulatory Visit: Payer: Self-pay | Admitting: Cardiology

## 2023-08-05 DIAGNOSIS — R55 Syncope and collapse: Secondary | ICD-10-CM

## 2023-08-05 DIAGNOSIS — Z8673 Personal history of transient ischemic attack (TIA), and cerebral infarction without residual deficits: Secondary | ICD-10-CM

## 2023-08-05 LAB — CUP PACEART REMOTE DEVICE CHECK
Date Time Interrogation Session: 20250713233956
Implantable Pulse Generator Implant Date: 20230517

## 2023-08-13 ENCOUNTER — Ambulatory Visit (HOSPITAL_COMMUNITY)
Admission: RE | Admit: 2023-08-13 | Discharge: 2023-08-13 | Disposition: A | Discharge status: Home / Self Care | Source: Ambulatory Visit | Attending: Nurse Practitioner | Admitting: Nurse Practitioner

## 2023-08-13 DIAGNOSIS — R0609 Other forms of dyspnea: Secondary | ICD-10-CM | POA: Diagnosis not present

## 2023-08-13 LAB — PULMONARY FUNCTION TEST
DL/VA % pred: 33 %
DL/VA: 1.26 ml/min/mmHg/L
DLCO unc % pred: 26 %
DLCO unc: 6.18 ml/min/mmHg
FEF 25-75 Post: 1.05 L/s
FEF 25-75 Pre: 0.75 L/s
FEF2575-%Change-Post: 39 %
FEF2575-%Pred-Post: 62 %
FEF2575-%Pred-Pre: 44 %
FEV1-%Change-Post: 13 %
FEV1-%Pred-Post: 81 %
FEV1-%Pred-Pre: 71 %
FEV1-Post: 2.12 L
FEV1-Pre: 1.87 L
FEV1FVC-%Change-Post: 11 %
FEV1FVC-%Pred-Pre: 79 %
FEV6-%Change-Post: 5 %
FEV6-%Pred-Post: 93 %
FEV6-%Pred-Pre: 88 %
FEV6-Post: 3.26 L
FEV6-Pre: 3.09 L
FEV6FVC-%Change-Post: 2 %
FEV6FVC-%Pred-Post: 102 %
FEV6FVC-%Pred-Pre: 100 %
FVC-%Change-Post: 2 %
FVC-%Pred-Post: 90 %
FVC-%Pred-Pre: 88 %
FVC-Post: 3.42 L
FVC-Pre: 3.34 L
Post FEV1/FVC ratio: 62 %
Post FEV6/FVC ratio: 95 %
Pre FEV1/FVC ratio: 56 %
Pre FEV6/FVC Ratio: 93 %
RV % pred: 76 %
RV: 2.12 L
TLC % pred: 77 %
TLC: 5.46 L

## 2023-08-13 MED ORDER — ALBUTEROL SULFATE (2.5 MG/3ML) 0.083% IN NEBU
2.5000 mg | INHALATION_SOLUTION | Freq: Once | RESPIRATORY_TRACT | Status: AC
  Administered 2023-08-13: 2.5 mg via RESPIRATORY_TRACT

## 2023-08-15 ENCOUNTER — Ambulatory Visit: Payer: Self-pay | Admitting: Nurse Practitioner

## 2023-08-15 DIAGNOSIS — R0609 Other forms of dyspnea: Secondary | ICD-10-CM

## 2023-08-19 ENCOUNTER — Telehealth: Payer: Self-pay | Admitting: Nurse Practitioner

## 2023-08-19 ENCOUNTER — Encounter: Payer: Self-pay | Admitting: Nurse Practitioner

## 2023-08-19 ENCOUNTER — Telehealth (HOSPITAL_BASED_OUTPATIENT_CLINIC_OR_DEPARTMENT_OTHER): Payer: Self-pay | Admitting: Nurse Practitioner

## 2023-08-19 NOTE — Telephone Encounter (Signed)
 Checking percert on the following patient for testing scheduled at [facility].    LEXISCAN     08/22/2023

## 2023-08-19 NOTE — Telephone Encounter (Signed)
 Lexiscan  letter of instructions sent to patient MyChart. Patient is aware and has agreed to test. Will place order as soon as provider provides a Dx for study and will send for scheduling.SABRASABRA

## 2023-08-19 NOTE — Telephone Encounter (Signed)
 Advised patient pcp we are going to do a Lexiscan  for patient and there was a question attached to results from pfts sent to pcp office and they were unsure if we needed their approval.

## 2023-08-19 NOTE — Telephone Encounter (Signed)
 Office calling to inquire about a letter received in regards to whether pt is able to have treadmill test or not. Please advise.

## 2023-08-22 ENCOUNTER — Encounter (HOSPITAL_COMMUNITY)
Admission: RE | Admit: 2023-08-22 | Discharge: 2023-08-22 | Disposition: A | Discharge status: Home / Self Care | Source: Ambulatory Visit | Attending: Nurse Practitioner | Admitting: Nurse Practitioner

## 2023-08-22 ENCOUNTER — Other Ambulatory Visit: Payer: Self-pay | Admitting: Physician Assistant

## 2023-08-22 ENCOUNTER — Encounter (HOSPITAL_COMMUNITY): Payer: Self-pay

## 2023-08-22 ENCOUNTER — Ambulatory Visit (HOSPITAL_COMMUNITY)
Admission: RE | Admit: 2023-08-22 | Discharge: 2023-08-22 | Disposition: A | Discharge status: Home / Self Care | Source: Ambulatory Visit | Attending: Cardiology | Admitting: Cardiology

## 2023-08-22 DIAGNOSIS — R0609 Other forms of dyspnea: Secondary | ICD-10-CM | POA: Diagnosis not present

## 2023-08-22 DIAGNOSIS — J449 Chronic obstructive pulmonary disease, unspecified: Secondary | ICD-10-CM | POA: Diagnosis not present

## 2023-08-22 DIAGNOSIS — I10 Essential (primary) hypertension: Secondary | ICD-10-CM | POA: Diagnosis not present

## 2023-08-22 HISTORY — DX: Malignant (primary) neoplasm, unspecified: C80.1

## 2023-08-22 HISTORY — DX: Unspecified asthma, uncomplicated: J45.909

## 2023-08-22 LAB — NM MYOCAR MULTI W/SPECT W/WALL MOTION / EF
Base ST Depression (mm): 0 mm
LV dias vol: 81 mL (ref 62–150)
LV sys vol: 36 mL (ref 4.2–5.8)
MPHR: 134 {beats}/min
Nuc Stress EF: 56 %
Peak HR: 78 {beats}/min
Percent HR: 58 %
RATE: 0.4
Rest HR: 63 {beats}/min
Rest Nuclear Isotope Dose: 11 mCi
SDS: 5
SRS: 6
SSS: 11
ST Depression (mm): 0 mm
Stress Nuclear Isotope Dose: 31.5 mCi
TID: 1.11

## 2023-08-22 MED ORDER — REGADENOSON 0.4 MG/5ML IV SOLN
INTRAVENOUS | Status: AC
  Administered 2023-08-22: 0.4 mg via INTRAVENOUS
  Filled 2023-08-22: qty 5

## 2023-08-22 MED ORDER — TECHNETIUM TC 99M TETROFOSMIN IV KIT
10.0000 | PACK | Freq: Once | INTRAVENOUS | Status: AC | PRN
  Administered 2023-08-22: 11 via INTRAVENOUS

## 2023-08-22 MED ORDER — SODIUM CHLORIDE FLUSH 0.9 % IV SOLN
INTRAVENOUS | Status: AC
  Administered 2023-08-22: 10 mL via INTRAVENOUS
  Filled 2023-08-22: qty 10

## 2023-08-22 MED ORDER — TECHNETIUM TC 99M TETROFOSMIN IV KIT
30.0000 | PACK | Freq: Once | INTRAVENOUS | Status: AC | PRN
  Administered 2023-08-22: 31.5 via INTRAVENOUS

## 2023-08-22 NOTE — Progress Notes (Signed)
     Justin Lyons presented for a Lexiscan  nuclear stress test today.  I Justin CINDERELLA Kapur, PA-C, provided direct supervision and was present during the stress portion of the study today, which was completed without significant symptoms, immediate complications, or acute ST/T changes on ECG.  Stress imaging is pending at this time.  Preliminary ECG findings may be listed in the chart, but the stress test result will not be finalized until perfusion imaging is complete.  Justin CINDERELLA Kapur, PA-C  08/22/2023, 9:48 AM

## 2023-08-28 NOTE — Progress Notes (Signed)
 [facility] Summary Report / Loop Recorder

## 2023-08-30 ENCOUNTER — Ambulatory Visit: Payer: Self-pay | Admitting: Nurse Practitioner

## 2023-09-02 IMAGING — MR MR HEAD W/O CM
11 of 12 series · 43 of 48 positions shown · non-contrast
Comparison: Head CT 06/05/2021

CLINICAL DATA: Transient ischemic attack

EXAM:
MRI HEAD WITHOUT CONTRAST
TECHNIQUE: Multiplanar, multiecho pulse sequences of the brain and surrounding
structures were obtained without intravenous contrast.

[Series 5: DWI · axial · 3.0mm · 0.92mm/px · z∈[-67,+73]mm · 7 of 100 slices shown (1 of 4)]
[im 1/100]
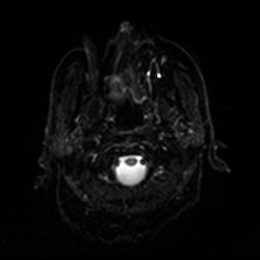
[im 17/100]
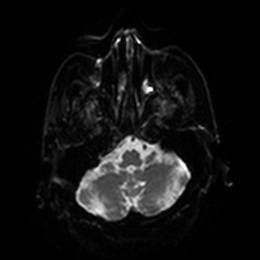
[im 34/100]
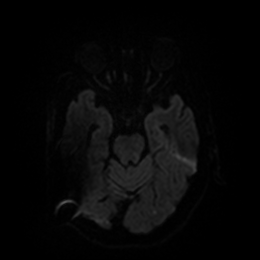
[im 50/100]
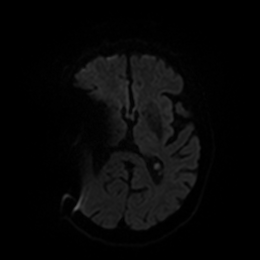
[im 67/100]
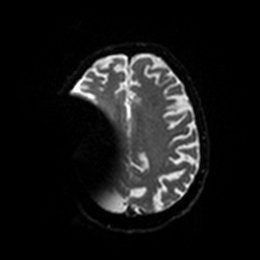
[im 83/100]
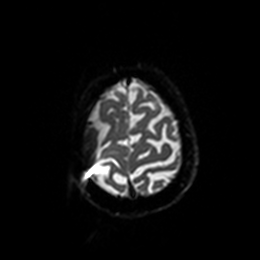
[im 100/100]
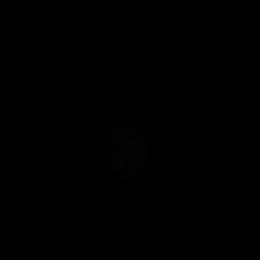

[Series 6: DWI · axial · 3.0mm · 0.92mm/px · z∈[-67,+73]mm · 4 of 50 slices shown (2 of 4)]
[im 1/50]
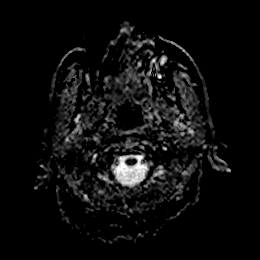
[im 17/50]
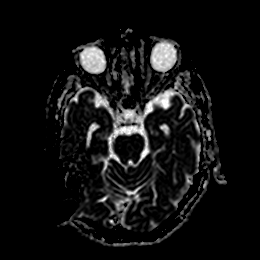
[im 33/50]
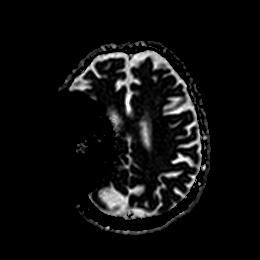
[im 50/50]
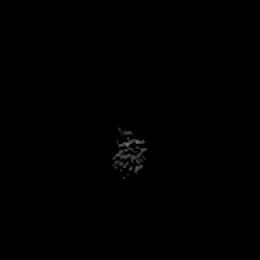

[Series 7: DWI · coronal · 4.0mm · 0.88mm/px · 6 of 76 slices shown (3 of 4)]
[im 1/76]
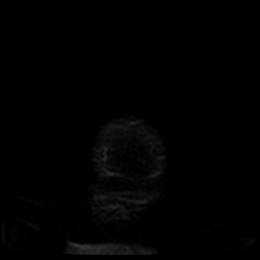
[im 16/76]
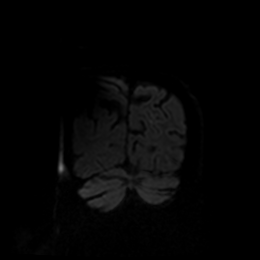
[im 31/76]
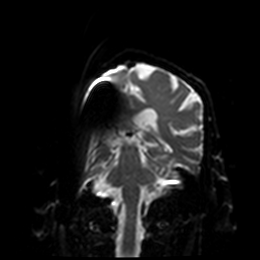
[im 46/76]
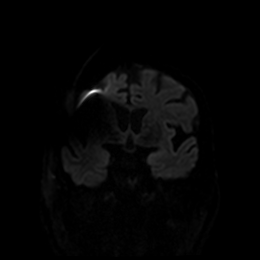
[im 61/76]
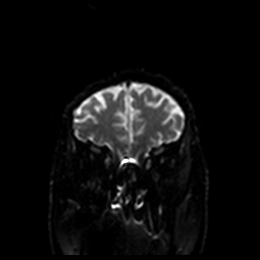
[im 76/76]
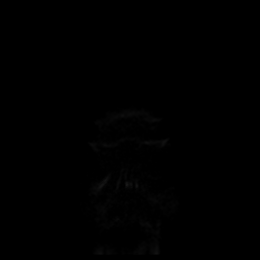

[Series 8: DWI · coronal · 4.0mm · 0.88mm/px · 3 of 38 slices shown (4 of 4)]
[im 1/38]
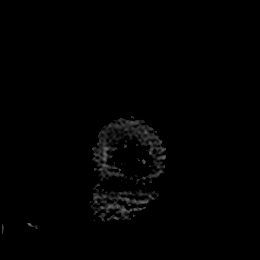
[im 19/38]
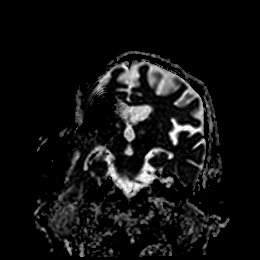
[im 38/38]
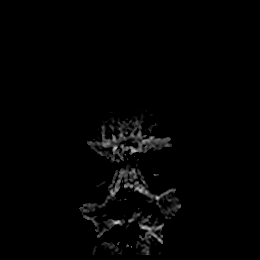

[Series 9: T1 · sagittal · 5.0mm · 0.75mm/px · 2 of 25 slices shown]
[im 1/25]
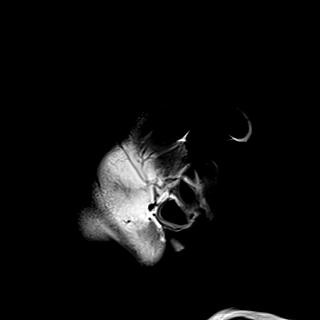
[im 25/25]
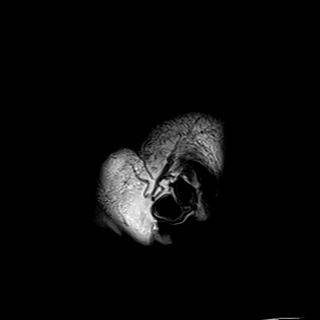

[Series 10: T2 · axial · 5.0mm · 0.72mm/px · z∈[-75,+74]mm · 2 of 27 slices shown (1 of 2)]
[im 1/27]
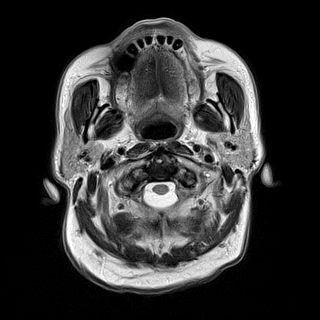
[im 27/27]
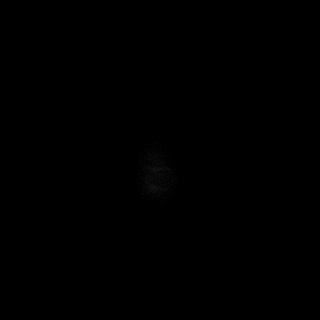

[Series 11: FLAIR · axial · 5.0mm · 0.45mm/px · z∈[-74,+75]mm · 2 of 27 slices shown]
[im 1/27]
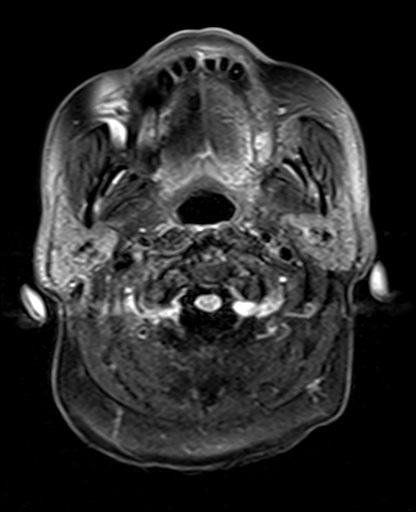
[im 27/27]
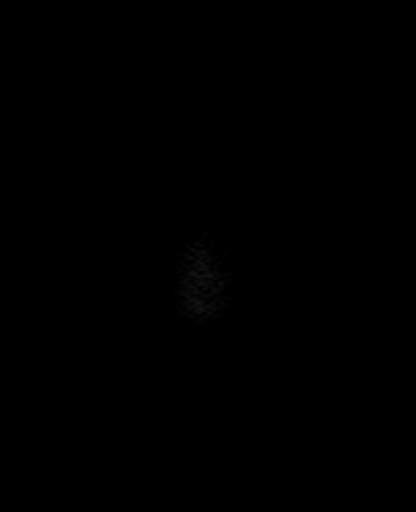

[Series 12: mag_images · axial · 3.0mm · 0.90mm/px · z∈[-93,+76]mm · 5 of 60 slices shown]
[im 1/60]
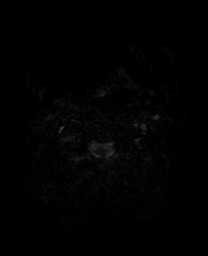
[im 15/60]
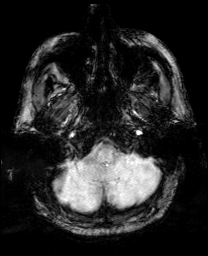
[im 30/60]
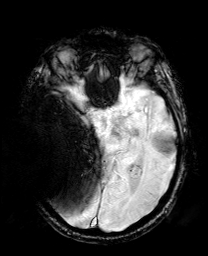
[im 45/60]
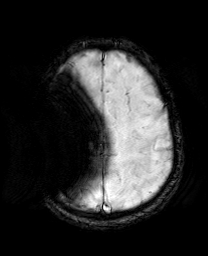
[im 60/60]
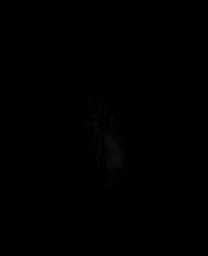

[Series 13: pha_images · axial · 3.0mm · 0.90mm/px · z∈[-93,+76]mm · 5 of 60 slices shown]
[im 1/60]
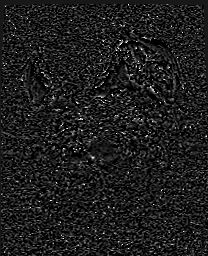
[im 15/60]
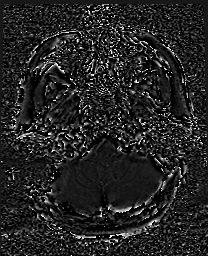
[im 30/60]
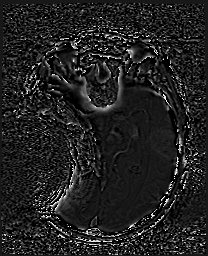
[im 45/60]
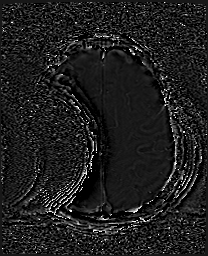
[im 60/60]
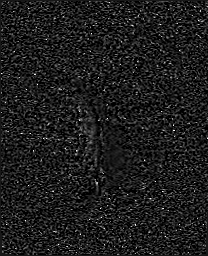

[Series 14: swi_images · axial · 3.0mm · 0.90mm/px · z∈[-93,+76]mm · 5 of 60 slices shown]
[im 1/60]
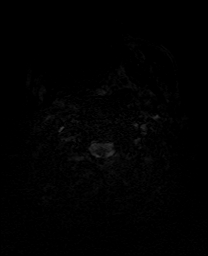
[im 15/60]
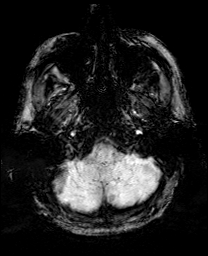
[im 30/60]
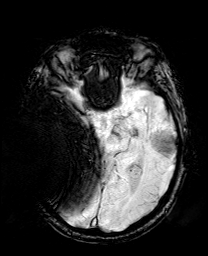
[im 45/60]
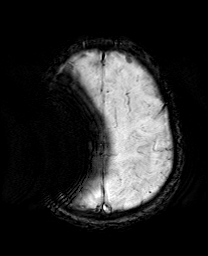
[im 60/60]
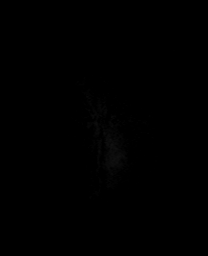

[Series 17: T2 · coronal · 5.0mm · 0.34mm/px · 2 of 31 slices shown (2 of 2)]
[im 1/31]
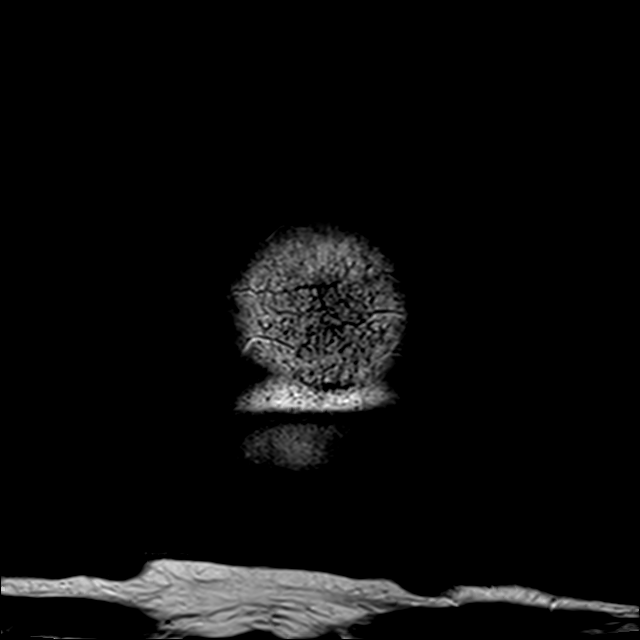
[im 31/31]
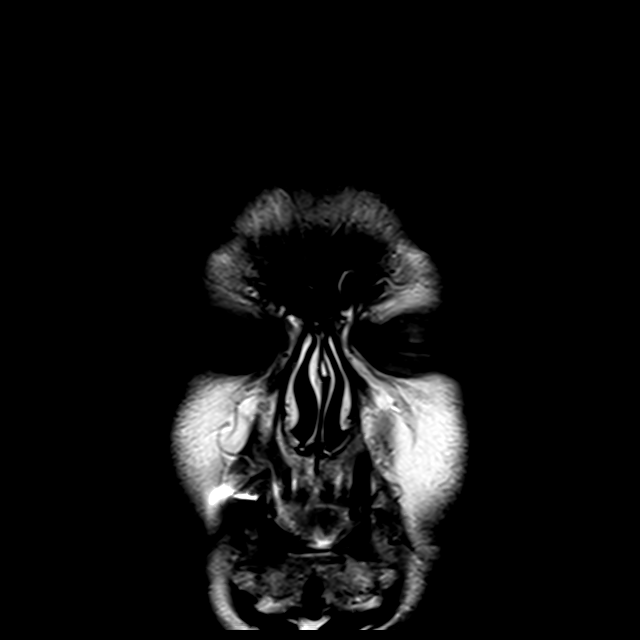

[43 of 48 positions shown; findings below may reference images not displayed]

FINDINGS: Susceptibility artifacts obscuring much of the right hemisphere on
multiple sequences. Brain: There are punctate foci of acute ischemia
within the posterior left parietal lobe, left occipital lobe and
left cerebellum. No acute hemorrhage. Normal white matter signal,
parenchymal volume and CSF spaces. The midline structures are
normal.

Vascular: Major flow voids are preserved.

Skull and upper cervical spine: Normal calvarium and skull base.
Visualized upper cervical spine and soft tissues are normal.

Sinuses/Orbits:No paranasal sinus fluid levels or advanced mucosal
thickening. No mastoid or middle ear effusion. Normal orbits.
IMPRESSION: Punctate foci of acute ischemia within the posterior left parietal
lobe, left occipital lobe and left cerebellum. No hemorrhage or mass
effect.

## 2023-09-02 IMAGING — CT CT HEAD W/O CM
4 series · 15 of 47 positions shown, 17 images · non-contrast
Comparison: None Available.

CLINICAL DATA: Delirium.  Confusion began last night.  Dizziness.



[Series 3: head without · axial · non-contrast · 0.45mm/px · z∈[-119,+1]mm · 7 of 34 slices shown, 9 images]
[im 5/34  brain]
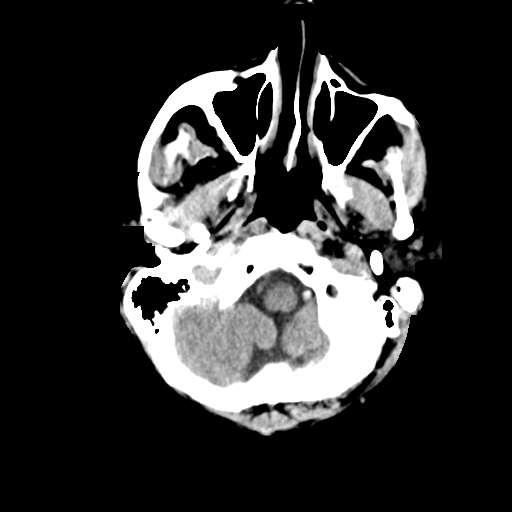
[im 5/34  bone]
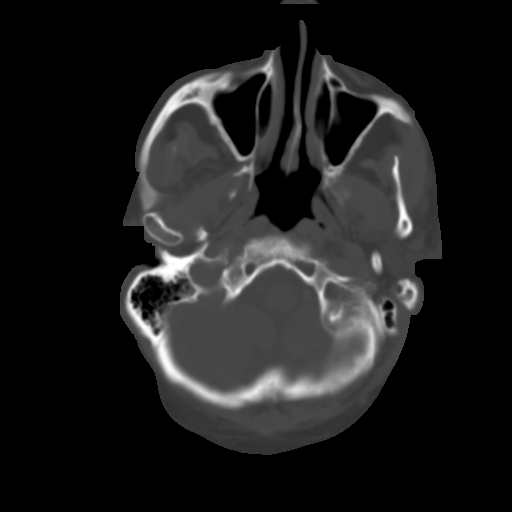
[im 9/34  brain]
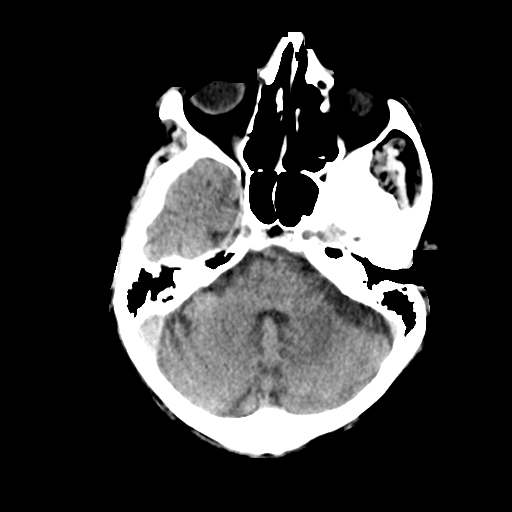
[im 13/34  brain]
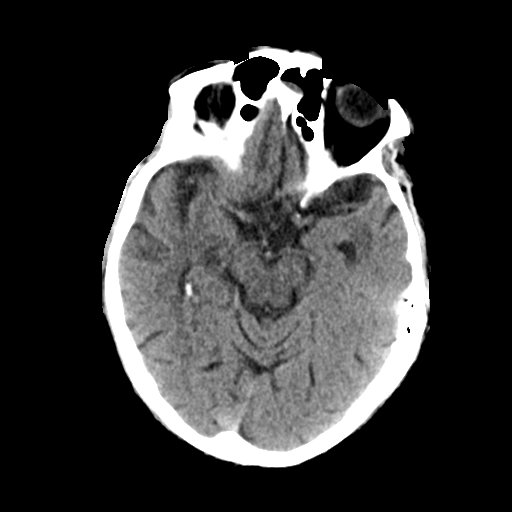
[im 17/34  brain]
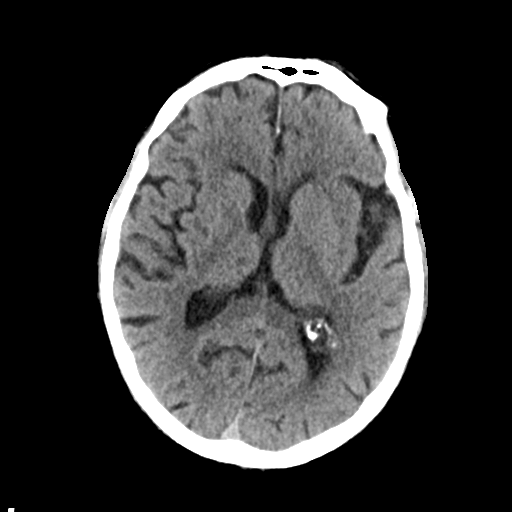
[im 21/34  brain]
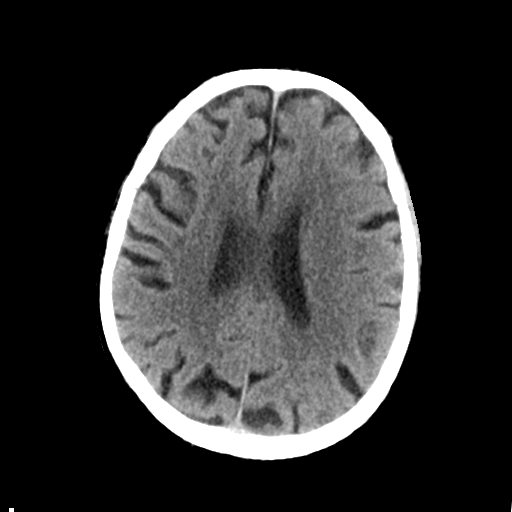
[im 21/34  bone]
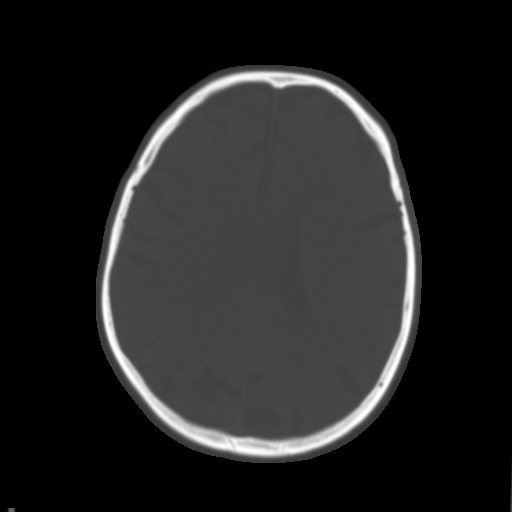
[im 25/34  brain]
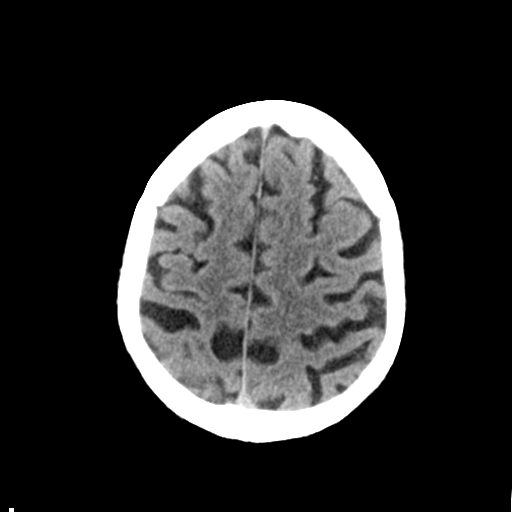
[im 29/34  brain]
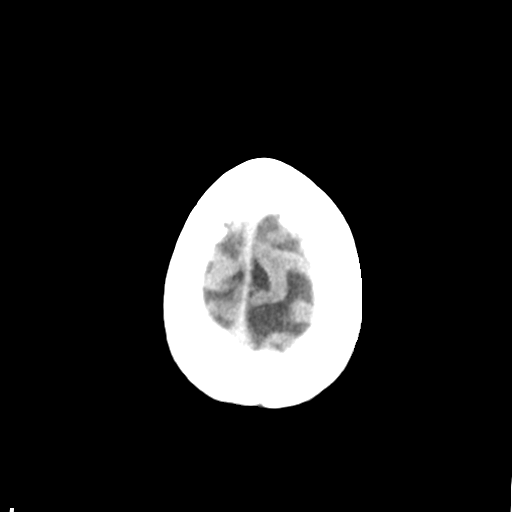

[Series 4: head bone · axial · 0.45mm/px · z∈[-123,-107]mm · 2 of 85 slices shown]
[im 9/85  bone]
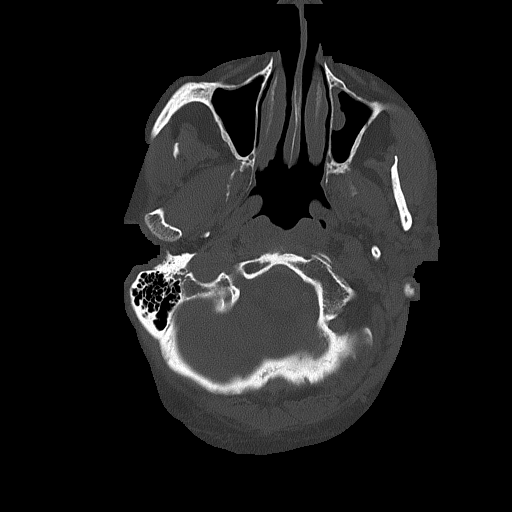
[im 17/85  bone]
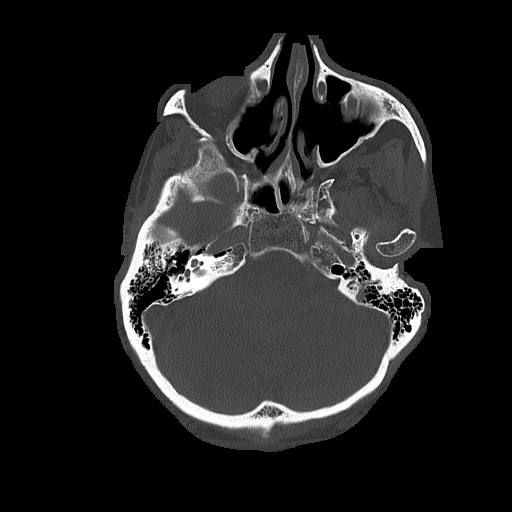

[Series 5: head without cor · coronal · non-contrast · 0.33mm/px · 3 of 73 slices shown]
[im 25/73  brain]
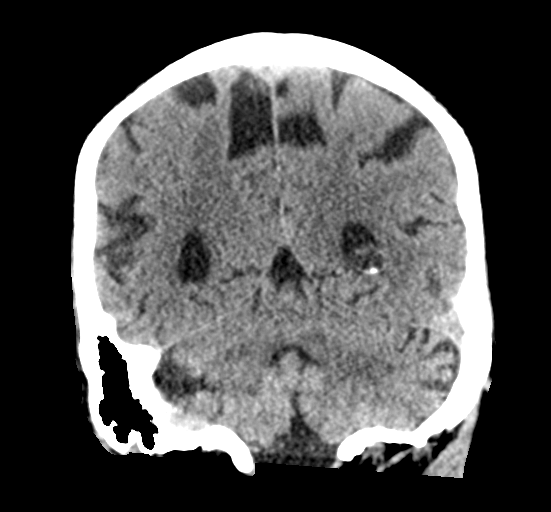
[im 33/73  brain]
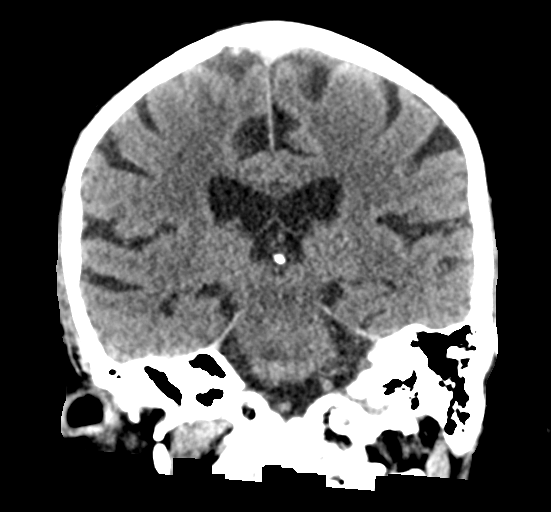
[im 41/73  brain]
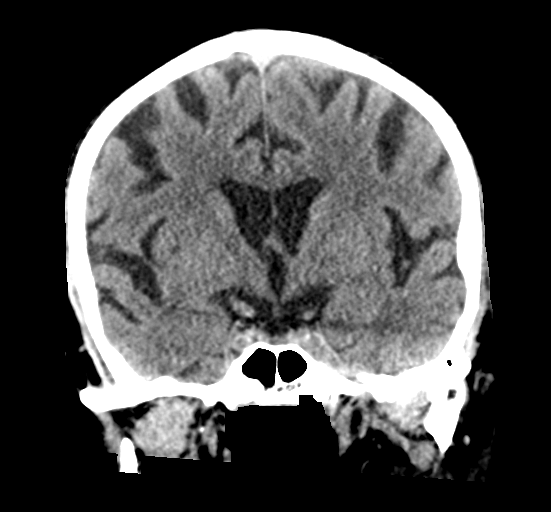

[Series 6: head without sag · sagittal · non-contrast · 0.34mm/px · 3 of 62 slices shown]
[im 21/62  brain]
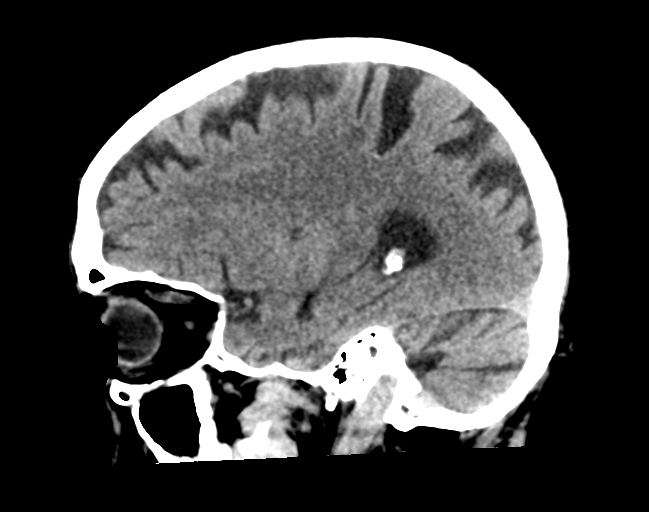
[im 31/62  brain]
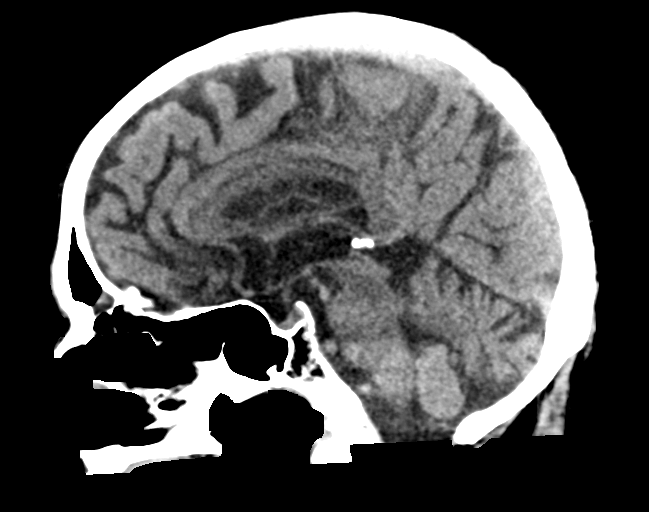
[im 41/62  brain]
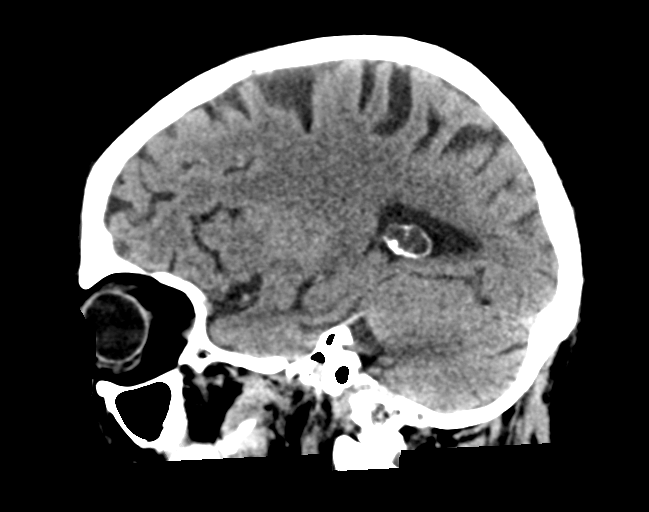

[15 of 47 positions shown; findings below may reference images not displayed]

FINDINGS: Brain: A 7 mm lacunar infarcts of the right lentiform nucleus
appears remote. Moderate generalized atrophy and white matter
hypoattenuation is evident bilaterally. More subtle focal
hypoattenuation is present in the right thalamus.

No acute infarct or hemorrhage is present. The ventricles are of
normal size. No significant extraaxial fluid collection is present.

The brainstem and cerebellum are within normal limits.

Vascular: Atherosclerotic calcifications are present within the
cavernous internal carotid arteries. No hyperdense vessel is
present.

Skull: Calvarium is intact. No focal lytic or blastic lesions are
present. No significant extracranial soft tissue lesion is present.

Sinuses/Orbits: Bilateral maxillary antrostomies and ethmoidectomies
noted. Residual polyp or mucous retention cyst is present in the
inferior left frontal sinus. The sinuses otherwise clear. Chronic
wall thickening is present in the posterior maxillary sinuses. The
mastoid air cells are clear. Bilateral lens replacements are noted.
Globes and orbits are otherwise unremarkable.
IMPRESSION: 1. No acute intracranial abnormality.
2. 7 mm lacunar infarcts of the right lentiform nucleus appears
remote.
3. More subtle focal hypoattenuation in the right thalamus appears
remote. This could be more acute. Correlate with any left-sided
symptoms.
4. Generalized atrophy and white matter disease is mildly advanced
for age. This likely reflects the sequela of chronic microvascular
ischemia.

## 2023-09-02 MED ORDER — NITROGLYCERIN 0.4 MG SL SUBL: 0.4000 mg | SUBLINGUAL_TABLET | SUBLINGUAL | 2 refills | Status: AC | PRN

## 2023-09-02 NOTE — Telephone Encounter (Signed)
-----   Message from [place] JONELLE Cable sent at 09/02/2023 11:01 AM EDT -----

## 2023-09-02 NOTE — Telephone Encounter (Signed)
 Spoke with patient and advised him his appointment has been moved up to 08/25 and that if we can get him in sooner we will call him back. Requested a new script for Nitroglycerin  his had expired. Also sent message to schedulers to call if anyone drops off Elizabeth's schedule. No other issues at this time

## 2023-09-02 NOTE — Telephone Encounter (Signed)
 Pt daughter called in to possibly get pt in sooner than 09/26/23 to discuss heart cath

## 2023-09-03 IMAGING — CT CT ANGIO HEAD-NECK (W OR W/O PERF)
1 of 11 series · 5 of 35 positions shown · non-contrast
Comparison: None Available.
COMPARISON: None Available.

Addendum:
CLINICAL DATA: Stroke follow-up

EXAM:
CT ANGIOGRAPHY HEAD AND NECK
TECHNIQUE: Multidetector CT imaging of the head and neck was performed using
the standard protocol during bolus administration of intravenous
contrast. Multiplanar CT image reconstructions and MIPs were
obtained to evaluate the vascular anatomy. Carotid stenosis
measurements (when applicable) are obtained utilizing NASCET
criteria, using the distal internal carotid diameter as the
denominator.

[Series 11: ax thins · axial · 0.39mm/px · z∈[-296,-64]mm · 5 of 360 slices shown]
[im 60/360  soft-tissue]
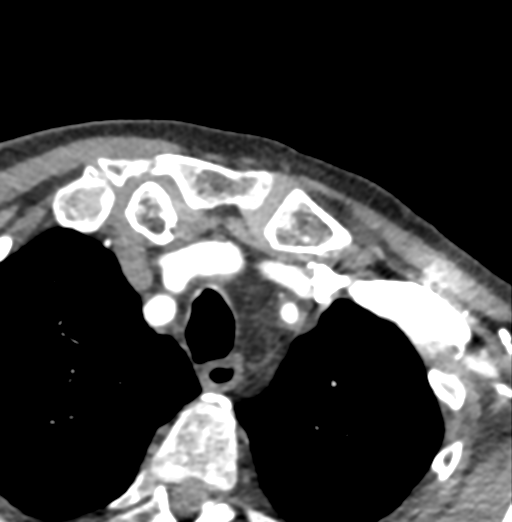
[im 120/360  bone]
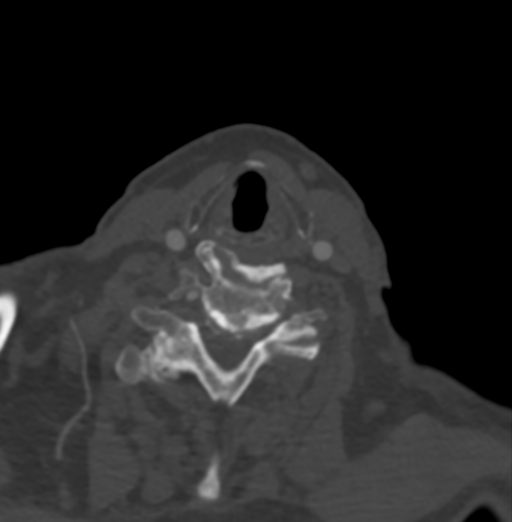
[im 180/360  soft-tissue]
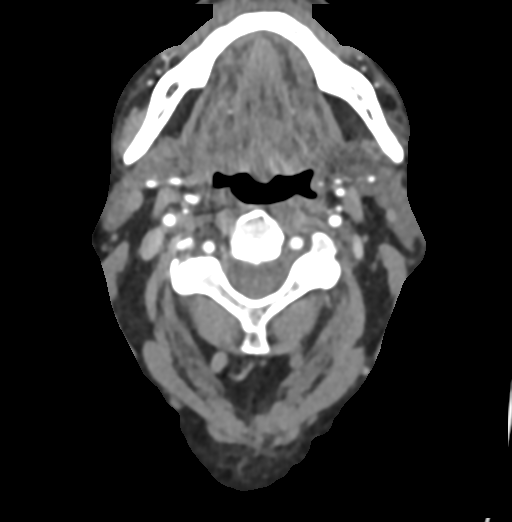
[im 240/360  bone]
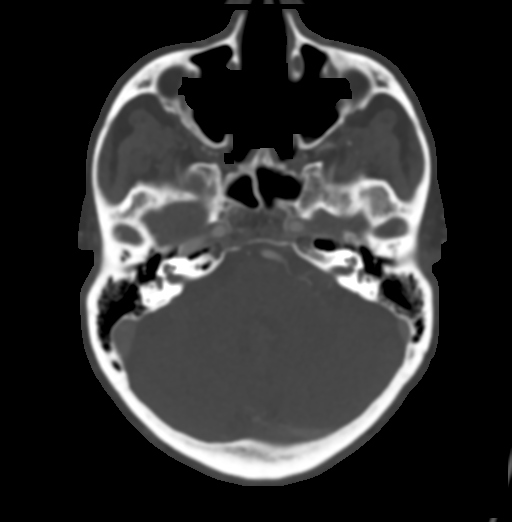
[im 300/360  soft-tissue]
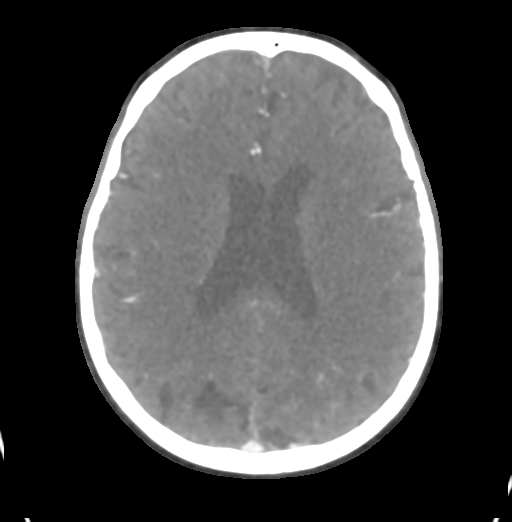

[5 of 35 positions shown; findings below may reference images not displayed]

RADIATION DOSE REDUCTION: This exam was performed according to the
departmental dose-optimization program which includes automated
exposure control, adjustment of the mA and/or kV according to
patient size and/or use of iterative reconstruction technique.

CONTRAST:  75mL OMNIPAQUE IOHEXOL 350 MG/ML SOLN
FINDINGS: CT HEAD FINDINGS

Brain: There is no mass, hemorrhage or extra-axial collection. There
is generalized atrophy without lobar predilection. There is
hypoattenuation of the periventricular white matter, most commonly
indicating chronic ischemic microangiopathy.

Skull: The visualized skull base, calvarium and extracranial soft
tissues are normal.

Sinuses/Orbits: No fluid levels or advanced mucosal thickening of
the visualized paranasal sinuses. No mastoid or middle ear effusion.
The orbits are normal.

CTA NECK FINDINGS

SKELETON: There is no bony spinal canal stenosis. No lytic or
blastic lesion.

OTHER NECK: Normal pharynx, larynx and major salivary glands. No
cervical lymphadenopathy. Unremarkable thyroid gland.

UPPER CHEST: Biapical emphysema.

AORTIC ARCH:

There is calcific atherosclerosis of the aortic arch. There is no
aneurysm, dissection or hemodynamically significant stenosis of the
visualized portion of the aorta. Conventional 3 vessel aortic
branching pattern. The visualized proximal subclavian arteries are
widely patent.

RIGHT CAROTID SYSTEM: No dissection, occlusion or aneurysm. Mild
atherosclerotic calcification at the carotid bifurcation without
hemodynamically significant stenosis.

LEFT CAROTID SYSTEM: No dissection, occlusion or aneurysm. Mild
atherosclerotic calcification at the carotid bifurcation without
hemodynamically significant stenosis.

VERTEBRAL ARTERIES: Left dominant configuration. Both origins are
clearly patent. There is no dissection, occlusion or flow-limiting
stenosis to the skull base (V1-V3 segments).

CTA HEAD FINDINGS

POSTERIOR CIRCULATION:

--Vertebral arteries: Normal V4 segments.

--Inferior cerebellar arteries: Normal.

--Basilar artery: Normal.

--Superior cerebellar arteries: Normal.

--Posterior cerebral arteries (PCA): Normal.

ANTERIOR CIRCULATION:

--Intracranial internal carotid arteries: Normal.

--Anterior cerebral arteries (ACA): Normal. Both A1 segments are
present. Patent anterior communicating artery (a-comm).

--Middle cerebral arteries (MCA): Normal.

VENOUS SINUSES: As permitted by contrast timing, patent.

ANATOMIC VARIANTS: None

Review of the MIP images confirms the above findings.
IMPRESSION: No emergent large vessel occlusion or hemodynamically significant
stenosis of the head or neck.

Aortic Atherosclerosis (5Z9ER-KBS.S) and Emphysema (5Z9ER-KNV.6).

ADDENDUM:
After further review with Dr. Wil Fredo, there is a chronic severe stenosis
of the proximal left subclavian artery. This is unchanged from the
CT angio chest 04/06/2017. Stenosis appears atherosclerotic and
proximal left vertebral artery. Left vertebral artery origin is
patent.

*** End of Addendum ***
RADIATION DOSE REDUCTION: This exam was performed according to the
departmental dose-optimization program which includes automated
exposure control, adjustment of the mA and/or kV according to
patient size and/or use of iterative reconstruction technique.

CONTRAST:  75mL OMNIPAQUE IOHEXOL 350 MG/ML SOLN
FINDINGS: CT HEAD FINDINGS

Brain: There is no mass, hemorrhage or extra-axial collection. There
is generalized atrophy without lobar predilection. There is
hypoattenuation of the periventricular white matter, most commonly
indicating chronic ischemic microangiopathy.

Skull: The visualized skull base, calvarium and extracranial soft
tissues are normal.

Sinuses/Orbits: No fluid levels or advanced mucosal thickening of
the visualized paranasal sinuses. No mastoid or middle ear effusion.
The orbits are normal.

CTA NECK FINDINGS

SKELETON: There is no bony spinal canal stenosis. No lytic or
blastic lesion.

OTHER NECK: Normal pharynx, larynx and major salivary glands. No
cervical lymphadenopathy. Unremarkable thyroid gland.

UPPER CHEST: Biapical emphysema.

AORTIC ARCH:

There is calcific atherosclerosis of the aortic arch. There is no
aneurysm, dissection or hemodynamically significant stenosis of the
visualized portion of the aorta. Conventional 3 vessel aortic
branching pattern. The visualized proximal subclavian arteries are
widely patent.

RIGHT CAROTID SYSTEM: No dissection, occlusion or aneurysm. Mild
atherosclerotic calcification at the carotid bifurcation without
hemodynamically significant stenosis.

LEFT CAROTID SYSTEM: No dissection, occlusion or aneurysm. Mild
atherosclerotic calcification at the carotid bifurcation without
hemodynamically significant stenosis.

VERTEBRAL ARTERIES: Left dominant configuration. Both origins are
clearly patent. There is no dissection, occlusion or flow-limiting
stenosis to the skull base (V1-V3 segments).

CTA HEAD FINDINGS

POSTERIOR CIRCULATION:

--Vertebral arteries: Normal V4 segments.

--Inferior cerebellar arteries: Normal.

--Basilar artery: Normal.

--Superior cerebellar arteries: Normal.

--Posterior cerebral arteries (PCA): Normal.

ANTERIOR CIRCULATION:

--Intracranial internal carotid arteries: Normal.

--Anterior cerebral arteries (ACA): Normal. Both A1 segments are
present. Patent anterior communicating artery (a-comm).

--Middle cerebral arteries (MCA): Normal.

VENOUS SINUSES: As permitted by contrast timing, patent.

ANATOMIC VARIANTS: None

Review of the MIP images confirms the above findings.
IMPRESSION: No emergent large vessel occlusion or hemodynamically significant
stenosis of the head or neck.

Aortic Atherosclerosis (5Z9ER-KBS.S) and Emphysema (5Z9ER-KNV.6).

## 2023-09-05 ENCOUNTER — Ambulatory Visit (INDEPENDENT_AMBULATORY_CARE_PROVIDER_SITE_OTHER)

## 2023-09-05 DIAGNOSIS — I639 Cerebral infarction, unspecified: Secondary | ICD-10-CM

## 2023-09-05 LAB — CUP PACEART REMOTE DEVICE CHECK
Date Time Interrogation Session: 20250813232416
Implantable Pulse Generator Implant Date: 20230517

## 2023-09-06 ENCOUNTER — Other Ambulatory Visit (HOSPITAL_BASED_OUTPATIENT_CLINIC_OR_DEPARTMENT_OTHER): Payer: Self-pay

## 2023-09-06 ENCOUNTER — Telehealth: Payer: Self-pay | Admitting: Nurse Practitioner

## 2023-09-06 ENCOUNTER — Other Ambulatory Visit: Payer: Self-pay | Admitting: Pharmacist

## 2023-09-06 ENCOUNTER — Encounter: Payer: Self-pay | Admitting: Nurse Practitioner

## 2023-09-06 ENCOUNTER — Ambulatory Visit: Attending: Nurse Practitioner | Admitting: Nurse Practitioner

## 2023-09-06 VITALS — BP 146/70 | HR 70 | Ht 70.0 in | Wt 156.2 lb

## 2023-09-06 DIAGNOSIS — Z8673 Personal history of transient ischemic attack (TIA), and cerebral infarction without residual deficits: Secondary | ICD-10-CM

## 2023-09-06 DIAGNOSIS — E785 Hyperlipidemia, unspecified: Secondary | ICD-10-CM | POA: Diagnosis not present

## 2023-09-06 DIAGNOSIS — Z9889 Other specified postprocedural states: Secondary | ICD-10-CM | POA: Diagnosis not present

## 2023-09-06 DIAGNOSIS — I2510 Atherosclerotic heart disease of native coronary artery without angina pectoris: Secondary | ICD-10-CM

## 2023-09-06 DIAGNOSIS — I480 Paroxysmal atrial fibrillation: Secondary | ICD-10-CM

## 2023-09-06 DIAGNOSIS — R0989 Other specified symptoms and signs involving the circulatory and respiratory systems: Secondary | ICD-10-CM | POA: Diagnosis not present

## 2023-09-06 DIAGNOSIS — I6523 Occlusion and stenosis of bilateral carotid arteries: Secondary | ICD-10-CM | POA: Diagnosis not present

## 2023-09-06 DIAGNOSIS — I10 Essential (primary) hypertension: Secondary | ICD-10-CM

## 2023-09-06 DIAGNOSIS — I771 Stricture of artery: Secondary | ICD-10-CM | POA: Diagnosis not present

## 2023-09-06 DIAGNOSIS — R0609 Other forms of dyspnea: Secondary | ICD-10-CM

## 2023-09-06 DIAGNOSIS — Z8679 Personal history of other diseases of the circulatory system: Secondary | ICD-10-CM

## 2023-09-06 DIAGNOSIS — J449 Chronic obstructive pulmonary disease, unspecified: Secondary | ICD-10-CM

## 2023-09-06 MED ORDER — APIXABAN 5 MG PO TABS
ORAL_TABLET | ORAL | 5 refills | Status: DC
  Filled 2023-09-06: qty 60, 30d supply, fill #0

## 2023-09-06 MED ORDER — DICLOFENAC SODIUM 75 MG PO TBEC
75.0000 mg | DELAYED_RELEASE_TABLET | Freq: Every day | ORAL | 3 refills | Status: DC
  Filled 2023-09-30: qty 90, 90d supply, fill #0

## 2023-09-06 MED ORDER — METOPROLOL TARTRATE 25 MG PO TABS
25.0000 mg | ORAL_TABLET | Freq: Two times a day (BID) | ORAL | 3 refills | Status: DC
  Filled 2023-09-30 – 2023-10-07 (×2): qty 180, 90d supply, fill #0

## 2023-09-06 MED ORDER — ROSUVASTATIN CALCIUM 40 MG PO TABS
40.0000 mg | ORAL_TABLET | Freq: Every day | ORAL | 3 refills | Status: AC
  Filled 2023-09-30: qty 90, 90d supply, fill #0
  Filled 2023-12-23: qty 90, 90d supply, fill #1

## 2023-09-06 MED ORDER — APIXABAN 5 MG PO TABS
ORAL_TABLET | ORAL | 1 refills | Status: DC
  Filled 2023-09-06: qty 180, fill #0

## 2023-09-06 NOTE — Telephone Encounter (Signed)
 Checking percert on the following patient for testing scheduled at [facility].   US  CAROTID DUPLEX-[id number]    09/10/2023

## 2023-09-06 NOTE — Patient Instructions (Addendum)
 Medication Instructions:  Your physician recommends that you continue on your current medications as directed. Please refer to the Current Medication list given to you today.  Labwork: In 1 week at [facility] at [facility]   Testing/Procedures: Your physician has requested that you have a carotid duplex. This test is an ultrasound of the carotid arteries in your neck. It looks at blood flow through these arteries that supply the brain with blood. Allow one hour for this exam. There are no restrictions or special instructions.  Follow-Up: Your physician recommends that you schedule a follow-up appointment in: 4 weeks   Any Other Special Instructions Will Be Listed Below (If Applicable).  If you need a refill on your cardiac medications before your next appointment, please call your pharmacy.

## 2023-09-06 NOTE — Progress Notes (Unsigned)
 Cardiology Office Note   Date:  09/06/2023 ID:  ELIAZ FOUT, DOB 05/18/1935, MRN [id number] PCP: Atilano Deward ORN, MD  [facility] Providers Cardiologist:  Alvan Carrier, MD     History of Present Illness MINOR IDEN is a 88 y.o. male with a PMH of CAD, hypertension, hyperlipidemia, PAF, history of AAA, s/p endovascular repair, COPD, and carotid artery stenosis with left subclavian stenosis, and past history of CVA, who presents today for scheduled follow-up.  Remote history of DES to left circumflex in 2005.  Also remote history of PTCA to RCA.  Followed by EP due to history of loop recorder.  Last seen by Dr. Carrier Alvan on November 20, 2022.  At that time, patient noticed some recent progression in shortness of breath/DOE.  Echocardiogram was obtained and was benign.  Recommended that pending results would likely pursue exercise nuclear stress test.  Today he presents for scheduled follow-up.  He states he continues to note stable [facility] and DOE. Denies any chest pain, palpitations, syncope, presyncope, dizziness, orthopnea, PND, swelling or significant weight changes, acute bleeding, or claudication.   Most recent Lexi scan was considered high risk, findings were consistent with ischemia and infarction.  See full report below.  Today he presents for follow-up with a family member, who is a Charity fundraiser.  Overall doing well but does admit to some shortness of breath with exertion. Denies any chest pain, palpitations, syncope, presyncope, dizziness, orthopnea, PND, swelling or significant weight changes, acute bleeding, or claudication.   ROS: Negative. See HPI. SH: Enjoys Hydrologist work. 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   Studies Reviewed EKG Interpretation Date/Time:  Friday September 06 2023 15:00:56 EDT Ventricular Rate:  70 PR Interval:  206 QRS Duration:  84 QT Interval:  418 QTC Calculation: 451 R Axis:   7  Text Interpretation: Normal sinus  rhythm Nonspecific T wave abnormality When compared with ECG of 20-Nov-2022 09:19, No significant change was found Confirmed by Miriam Norris [phone]) on 09/06/2023 3:06:13 PM   EKG:  EKG Interpretation Date/Time:  Friday September 06 2023 15:00:56 EDT Ventricular Rate:  70 PR Interval:  206 QRS Duration:  84 QT Interval:  418 QTC Calculation: 451 R Axis:   7  Text Interpretation: Normal sinus rhythm Nonspecific T wave abnormality When compared with ECG of 20-Nov-2022 09:19, No significant change was found Confirmed by Miriam Norris [phone]) on 09/06/2023 3:06:13 PM    Lexiscan  07/2023:    No ST deviation was noted. Baseline EKG showed non-specific ST-T changes in the inferior leads. Frequent PACs are noted. Pharmacological protocol is used.   LV perfusion is abnormal. There is evidence of ischemia. There is evidence of infarction. Defect 1: There is a medium sized reversible perfusion defect with moderate reduction in uptake present in the apical to mid inferolateral wall with hypokinesis consistent with ischemia. There is a medium sized fixed perfusion defect with moderate reduction in uptake present in the mid to basal inferolateral wall with hypokinesis consistent with infarction.   Left ventricular function is abnormal. Global function is normal. There was a single regional abnormality in the inferolateral wall. Nuclear stress EF: 56%.   Findings are consistent with ischemia and infarction. The study is high risk.  EVAR duplex 07/2023:  Summary:  Abdominal Aorta: The largest aortic diameter has decreased compared to  prior exam. Previous diameter measurement was 4.1 cm obtained on 07/02/22.  Stenosis:  Patent endovascular aneurysm repair with no evidence of endoleak.   Echo  11/2022:  1. Left ventricular ejection fraction, by estimation, is 55 to 60%. The  left ventricle has normal function. The left ventricle has no regional  wall motion abnormalities. There is mild left ventricular  hypertrophy.  Left ventricular diastolic parameters  are consistent with Grade I diastolic dysfunction (impaired relaxation).  Normal LVEDP.   2. Right ventricular systolic function is normal. The right ventricular  size is normal.   3. The mitral valve is normal in structure. Mild mitral valve  regurgitation. No evidence of mitral stenosis.   4. The aortic valve is tricuspid. Aortic valve regurgitation is mild. No  aortic stenosis is present.   5. The inferior vena cava is normal in size but collapsibility could not  be evaluated.   Comparison(s): No significant change from prior study.  Carotid duplex 06/2022:  Summary:  Right Carotid: Velocities in the right ICA are consistent with a 1-39%  stenosis.   Left Carotid: Velocities in the left ICA are consistent with a 1-39%  stenosis.   Vertebrals: Right vertebral artery demonstrates antegrade flow. Left  vertebral artery demonstrates retrograde flow.  Subclavians: Left subclavian artery flow was disturbed. Normal flow  hemodynamics were seen in the right subclavian artery.   *See table(s) above for measurements and observations.  Vascular ultrasound US  EVAR duplex 06/2022:  Summary:  Abdominal Aorta: Patent endovascular aneurysm repair with no evidence of  endoleak. The largest aortic diameter has decreased compared to prior  exam. Previous diameter measurement was 4.8 cm obtained on 06/12/2021.    Physical Exam VS:  BP (!) 146/70 (BP Location: Right Arm)   Pulse 70   Ht 5' 10 (1.778 m)   Wt 156 lb 3.2 oz (70.9 kg)   SpO2 96%   BMI 22.41 kg/m    Wt Readings from Last 3 Encounters:  09/06/23 156 lb 3.2 oz (70.9 kg)  07/29/23 154 lb 1.6 oz (69.9 kg)  06/26/23 155 lb 3.2 oz (70.4 kg)    GEN: Well nourished, well developed in no acute distress NECK: No JVD; right carotid bruit , no left carotid bruit  CARDIAC: S1/S2, RRR, no murmurs, rubs, gallops RESPIRATORY:  Clear to auscultation without rales, wheezing or rhonchi   ABDOMEN: Soft, non-tender, non-distended EXTREMITIES:  No edema; No deformity   ASSESSMENT AND PLAN  CAD Remote history of DES to left circumflex in 2005.  Also remote history of PTCA to RCA. Denies any chest pain, does admit to DOE.  See most recent abnormal Lexiscan  above. Reviewed with Dr. Alvan today via secure chat who recommended updating Echo and if no improvement in symtpoms, then consider right and left heart cath. Will confirm with Dr. Alvan about limited or complete Echo. He is not on aspirin  due to being on Eliquis .  Continue current medication regimen. Heart healthy diet and regular cardiovascular exercise encouraged.  Will obtain CBC and BMET.  PAF Denies any tachycardia or palpitations.  Heart rate is well-controlled today.  Continue Lopressor .  Continue Eliquis  5 mg twice daily for stroke prevention.  He is on appropriate dosage denies any bleeding issues. Heart healthy diet and regular cardiovascular exercise encouraged.   HTN Blood pressure slightly elevated but well controled at home. Discussed to monitor BP at home at least 2 hours after medications and sitting for 5-10 minutes.  No medication changes at this time. Heart healthy diet and regular cardiovascular exercise encouraged.   HLD LDL 58 in December 2024.  Continue current medication regimen. Heart healthy diet and regular cardiovascular exercise  encouraged.  Continue to follow with PCP.  AAA, s/p endovascular repair Followed closely by vascular surgery.  Most recent duplex revealed that the largest aortic diameter had decreased prior to exam.  There was evidence of patent endovascular aneurysm repair with no evidence of endoleak.  Continue follow-up with [facility] who is managing this.   Carotid artery stenosis with left subclavian stenosis, bruit Followed closely by vascular surgery.  Carotid duplex in June 2024 revealed 1 to 39% stenosis along bilateral ICAs with left vertebral artery demonstrating retrograde flow in  his left subclavian artery flow was disturbed, normal flow hemodynamics are seen in the right subclavian artery.  Does have right carotid bruit noted on exam.  Will update carotid duplex at this time.  Denies any concerning signs or symptoms.  Continue follow-up with [facility].  Hx of CVA Denies any recent issues.  No medication changes at this time.  Continue follow-up with PCP.  COPD, DOE Does admit to some stable and continued shortness of breath with exertion. Will update Echo and consult Dr. Alvan about which Echo. Continue follow-up with PCP.  Care and ED precautions discussed.       Dispo: Follow-up with me/APP in 3 to 4 weeks or sooner if any changes.  Signed, Almarie Crate, NP

## 2023-09-07 ENCOUNTER — Ambulatory Visit: Payer: Self-pay | Admitting: Cardiology

## 2023-09-09 ENCOUNTER — Encounter: Payer: Self-pay | Admitting: Cardiology

## 2023-09-09 ENCOUNTER — Other Ambulatory Visit (HOSPITAL_COMMUNITY)
Admission: RE | Admit: 2023-09-09 | Discharge: 2023-09-09 | Disposition: A | Discharge status: Home / Self Care | Source: Ambulatory Visit | Attending: Nurse Practitioner | Admitting: Nurse Practitioner

## 2023-09-09 DIAGNOSIS — I480 Paroxysmal atrial fibrillation: Secondary | ICD-10-CM | POA: Insufficient documentation

## 2023-09-09 DIAGNOSIS — R0989 Other specified symptoms and signs involving the circulatory and respiratory systems: Secondary | ICD-10-CM | POA: Insufficient documentation

## 2023-09-09 LAB — CBC
HCT: 32.9 % — ABNORMAL LOW (ref 39.0–52.0)
Hemoglobin: 10.8 g/dL — ABNORMAL LOW (ref 13.0–17.0)
MCH: 32.7 pg (ref 26.0–34.0)
MCHC: 32.8 g/dL (ref 30.0–36.0)
MCV: 99.7 fL (ref 80.0–100.0)
Platelets: 135 K/uL — ABNORMAL LOW (ref 150–400)
RBC: 3.3 MIL/uL — ABNORMAL LOW (ref 4.22–5.81)
RDW: 14.2 % (ref 11.5–15.5)
WBC: 9.2 K/uL (ref 4.0–10.5)
nRBC: 0 % (ref 0.0–0.2)

## 2023-09-09 LAB — BASIC METABOLIC PANEL WITH GFR
Anion gap: 10 (ref 5–15)
BUN: 31 mg/dL — ABNORMAL HIGH (ref 8–23)
CO2: 22 mmol/L (ref 22–32)
Calcium: 8.4 mg/dL — ABNORMAL LOW (ref 8.9–10.3)
Chloride: 107 mmol/L (ref 98–111)
Creatinine, Ser: 2.51 mg/dL — ABNORMAL HIGH (ref 0.61–1.24)
GFR, Estimated: 24 mL/min — ABNORMAL LOW (ref >60)
Glucose, Bld: 120 mg/dL — ABNORMAL HIGH (ref 70–99)
Potassium: 3.6 mmol/L (ref 3.5–5.1)
Sodium: 139 mmol/L (ref 135–145)

## 2023-09-10 ENCOUNTER — Encounter: Payer: Self-pay | Admitting: *Deleted

## 2023-09-10 ENCOUNTER — Telehealth: Payer: Self-pay | Admitting: Nurse Practitioner

## 2023-09-10 ENCOUNTER — Ambulatory Visit (HOSPITAL_COMMUNITY)

## 2023-09-10 NOTE — Telephone Encounter (Signed)
 Checking percert on the following patient for   RIGHT/LEFT HEART CATH AND CORONARY ANGIOGRAPHY   Monday, Aug 25 with Dr. Mady at 9 am

## 2023-09-11 ENCOUNTER — Telehealth: Payer: Self-pay | Admitting: Nurse Practitioner

## 2023-09-11 NOTE — Telephone Encounter (Signed)
 Daughter just wanted to notify Almarie Crate, NP that patient had labs done yesterday. She would like a call back to review when labs have been interpreted.

## 2023-09-12 ENCOUNTER — Telehealth: Payer: Self-pay | Admitting: *Deleted

## 2023-09-12 ENCOUNTER — Ambulatory Visit: Payer: Self-pay | Admitting: Nurse Practitioner

## 2023-09-12 ENCOUNTER — Telehealth: Payer: Self-pay | Admitting: Nurse Practitioner

## 2023-09-12 DIAGNOSIS — R0602 Shortness of breath: Secondary | ICD-10-CM

## 2023-09-12 NOTE — Telephone Encounter (Signed)
-----   Message from Almarie Crate sent at 09/12/2023 12:21 PM EDT ----- Regarding: RE: [facility] Monday August 25 Dr End Thanks for reaching out.   I have reviewed his chart and labs from June and December 2024.  Those are the only more recent labs available for review before his labs from this past week.  Serum creatinine trending 1.2-1.3. This is AKI on CKD.  I recommend stopping lisinopril  altogether and can consider restarting this if kidney function begins to improve later on down the road.  I would definitely make sure he is hydrated and repeat a stat BMET either today or tomorrow.    Thank you Dr. Mady for your recommendations. We will arrange a STAT 2D Echo to get a better understanding of his symptoms.   Tori, please order a 2D echocardiogram for shortness of breath with exertion and let's get this labwork (BMET) done either today or tomorrow along with Echocardiogram.  Thanks!   Best, Almarie Crate, NP ----- Message ----- From: Mady Bruckner, MD Sent: 09/12/2023   8:12 AM EDT To: Arlean CHRISTELLA Lipps, RN; Dorn JULIANNA Ross, MD; # Subject: RE: [facility] Monday August 25 Dr End              Creatinine seems to be much higher than his most recent baseline available in epic (1 -> 2.5) since 10/2021.  Do we know if there have been any other creatinine checks in the meantime to help us  better understand whether this is AKI or more chronic process?  I think it would also be helpful to have an echocardiogram first to understand his LVEF and exclude significant valvular heart disease, particularly if we are going to consider aggressively prehydrating him for his renal insufficiency.  Thanks.  [place] ----- Message ----- From: Lipps Arlean CHRISTELLA, RN Sent: 09/12/2023   8:03 AM EDT To: Bruckner Mady, MD; Arlean CHRISTELLA Lipps, RN; Jo# Subject: RE: [facility] Monday August 25 Dr End              Almarie And Dr Ross-  I have not talked with the patient yet about pre-procedure hydration. Just let me know if  okay to proceed with cath and pre-procedure hydration and then I can talk with the patient.  Thanks,  Programmer, applications ----- Message ----- From: Lipps Arlean CHRISTELLA, RN Sent: 09/11/2023  11:21 AM EDT To: Bruckner Mady, MD; Arlean CHRISTELLA Lipps, RN; Jo# Subject: [facility] Monday August 25 Dr End                  Hi Dr Ross and Almarie,  I just wanted to make sure everyone was aware that 09/09/23 eGFR 24. Since GFR < 45, I have arranged for ~ 4 hours pre-procedure hydration. I will ask him to hold voltaren  and lisinopril  day before and day of procedure.  Usual IV flow rate for hydration is 3 ml/kg/hr for 1 hour, then 1 ml/kg/hr continuous.  Please let me know you agree with plan.  Thanks, Arlean

## 2023-09-12 NOTE — Telephone Encounter (Addendum)
 Patient informed and verbalized understanding of plan. Spoke with patient and daughter, she states at [employer] wanted to go ahead with Echo and she called Monday and Dr.Branch stated patient did not need echo, I advised patient daughter this is regarding to be done before cath. Transferred call to haley up front to schedule for STAT Echo today or tomorrow. Labs have been sent to be done at [facility] and echo will be done tomorrow at [facility]

## 2023-09-12 NOTE — Telephone Encounter (Addendum)
 Cardiac cath has been rescheduled to allow time to get echocardiogram and repeat BMP results prior to cath. I discussed this with patient's daughter (pt verbal permission), [name] and reviewed instructions with her.  Cardiac Catheterization scheduled at [facility] for: Thursday September 19, 2023 12 Noon Arrival time [facility] Main Entrance A at: 7 AM-pre-procedure hydration  Diet: -Nothing to eat after midnight prior to procedure.  -May have light meal until  6 AM. (6 hours before procedure time) Approved light meal consists of plain toast, fruit, light soups, crackers.  Hydration: -May drink clear liquids until leaving for hospital. Approved liquids: Water, clear tea, black coffee, fruit juices-non-citric and without pulp,Gatorade, plain Jello/popsicles.  No PO hydration (bottle of water) -going in early for IVF  Medication instructions: -Hold:  Eliquis -none 09/17/23 until post procedure  Voltaren -day before and day of procedure-per protocol GFR < 60  Lisinopril  stopped 09/12/23 -Other usual morning medications can be taken including aspirin  81 mg.  Plan to go home the same day, you will only stay overnight if medically necessary.  You must have responsible adult to drive you home.  Someone must be with you the first 24 hours after you arrive home.

## 2023-09-13 ENCOUNTER — Other Ambulatory Visit (HOSPITAL_COMMUNITY)
Admission: RE | Admit: 2023-09-13 | Discharge: 2023-09-13 | Disposition: A | Discharge status: Home / Self Care | Source: Ambulatory Visit | Attending: Nurse Practitioner | Admitting: Nurse Practitioner

## 2023-09-13 ENCOUNTER — Ambulatory Visit (HOSPITAL_COMMUNITY)
Admission: RE | Admit: 2023-09-13 | Discharge: 2023-09-13 | Disposition: A | Discharge status: Home / Self Care | Source: Ambulatory Visit | Attending: Nurse Practitioner | Admitting: Nurse Practitioner

## 2023-09-13 DIAGNOSIS — R0602 Shortness of breath: Secondary | ICD-10-CM | POA: Diagnosis not present

## 2023-09-13 LAB — ECHOCARDIOGRAM COMPLETE
Area-P 1/2: 4.49 cm2
Calc EF: 55.3 %
MV M vel: 5.23 m/s
MV Peak grad: 109.4 mmHg
P 1/2 time: 375 ms
Radius: 0.3 cm
S' Lateral: 2.85 cm
Single Plane A2C EF: 56.2 %
Single Plane A4C EF: 52.7 %

## 2023-09-13 LAB — BASIC METABOLIC PANEL WITH GFR
Anion gap: 12 (ref 5–15)
BUN: 35 mg/dL — ABNORMAL HIGH (ref 8–23)
CO2: 22 mmol/L (ref 22–32)
Calcium: 8.8 mg/dL — ABNORMAL LOW (ref 8.9–10.3)
Chloride: 107 mmol/L (ref 98–111)
Creatinine, Ser: 2.88 mg/dL — ABNORMAL HIGH (ref 0.61–1.24)
GFR, Estimated: 21 mL/min — ABNORMAL LOW (ref >60)
Glucose, Bld: 106 mg/dL — ABNORMAL HIGH (ref 70–99)
Potassium: 4.2 mmol/L (ref 3.5–5.1)
Sodium: 141 mmol/L (ref 135–145)

## 2023-09-13 NOTE — Progress Notes (Signed)
*  PRELIMINARY RESULTS* Echocardiogram 2D Echocardiogram has been performed.  Justin Lyons 09/13/2023, 9:22 AM

## 2023-09-14 ENCOUNTER — Ambulatory Visit: Payer: Self-pay | Admitting: Nurse Practitioner

## 2023-09-14 DIAGNOSIS — N184 Chronic kidney disease, stage 4 (severe): Secondary | ICD-10-CM

## 2023-09-16 ENCOUNTER — Other Ambulatory Visit: Payer: Self-pay | Admitting: *Deleted

## 2023-09-16 ENCOUNTER — Ambulatory Visit: Admitting: Nurse Practitioner

## 2023-09-16 ENCOUNTER — Other Ambulatory Visit (HOSPITAL_BASED_OUTPATIENT_CLINIC_OR_DEPARTMENT_OTHER): Payer: Self-pay

## 2023-09-16 DIAGNOSIS — I480 Paroxysmal atrial fibrillation: Secondary | ICD-10-CM

## 2023-09-16 MED ORDER — APIXABAN 2.5 MG PO TABS
ORAL_TABLET | ORAL | 1 refills | Status: DC
  Filled 2023-09-16: qty 60, 30d supply, fill #0
  Filled 2023-09-30 – 2023-10-07 (×2): qty 180, 90d supply, fill #1

## 2023-09-16 NOTE — Telephone Encounter (Signed)
 Son Wilhelmsen) returned staff call regarding patient's results.

## 2023-09-16 NOTE — Telephone Encounter (Signed)
 Daughter Maya) stated her brother told her he received a call regarding patient's results but daughter noted patient was not called and wants a call back to the patient directly at [phone] or her at [phone].

## 2023-09-18 ENCOUNTER — Telehealth: Payer: Self-pay | Admitting: *Deleted

## 2023-09-18 NOTE — Telephone Encounter (Signed)
 Will forward to Almarie Crate, NP and Dr Alvan.

## 2023-09-18 NOTE — Telephone Encounter (Signed)
 Per Dr Alvan copied from 09/17/23 staff message:  Justin FALCON, MD  End, Lonni, MD; Lajoyce Arlean HERO, RN; Miriam Norris, NP We will cancel cath, sort out renal function and get echo   I spoke with patient's daughter, [name], to let her know Dr Alvan recommended cancelling cardiac cath to get renal function sorted out first.  [name] tells me she is frustrated with the situation and not satisfied with our office communication, she is considering changing to another cardiology practice.   [name] is concerned that patient's follow up appt in [facility] is not until 10/14/23, requests call back to discuss sooner follow up/plan going forward, and requests another blood test to check patient's renal function. [name] did confirm patient stopped lisinopril  and diclofenac  09/12/23.  [name] is aware cardiac cath 8/28 has been cancelled per Dr Alvan, patient can resume Eliquis  since cath has been cancelled, and I forward message to Norris Miriam, NP/ Dr Alvan requesting they follow up with her.

## 2023-09-19 ENCOUNTER — Encounter (HOSPITAL_COMMUNITY): Admission: RE | Payer: Self-pay | Source: Home / Self Care

## 2023-09-19 ENCOUNTER — Telehealth: Payer: Self-pay | Admitting: Nurse Practitioner

## 2023-09-19 ENCOUNTER — Ambulatory Visit (HOSPITAL_COMMUNITY): Admission: RE | Admit: 2023-09-19 | Source: Home / Self Care | Admitting: Internal Medicine

## 2023-09-19 SURGERY — RIGHT/LEFT HEART CATH AND CORONARY ANGIOGRAPHY
Anesthesia: LOCAL

## 2023-09-19 NOTE — Addendum Note (Signed)
 Addended by: Bhavin Monjaraz on: 09/19/2023 01:05 PM   Modules accepted: Orders

## 2023-09-19 NOTE — Telephone Encounter (Signed)
 Per Miriam  . Let's get a repeat BMET per patient's request to be drawn at [facility] either today or tomorrow.  I discussed it may come down a bit or may not at all. But we are still not doing the cath.  Patient informed and verbalized understanding of plan. Lab sent to aph

## 2023-09-19 NOTE — Telephone Encounter (Signed)
 Called and spoke with patient today.  Working on Social worker on file.  I called and updated him and explained why right and left heart cath procedure has been canceled for today.  Went over and reviewed his past 2 kidney functions/BMPs on file.  Discussed risk of needing dialysis if to proceed with cardiac catheterization and also explained recommendation by Dr. Alvan to cancel cardiac catheterization.  Patient and daughter are wanting to repeat BMET as most recent BMET was obtained too soon. Will place order for BMET and verbal order given to CMA - this will be drawn within the next week.   All questions and concerns were addressed.  Patient verbalized understanding was appreciative my call.  Almarie Crate, NP

## 2023-09-19 NOTE — Telephone Encounter (Signed)
 Left detailed message informing dtr that NP will reach out to her later today around lunchtime.

## 2023-09-19 NOTE — Telephone Encounter (Signed)
 Pt's daughter Justin Lyons was returning a call from this morning and is requesting a callback. Please advise

## 2023-09-20 ENCOUNTER — Other Ambulatory Visit (HOSPITAL_COMMUNITY)
Admission: RE | Admit: 2023-09-20 | Discharge: 2023-09-20 | Disposition: A | Discharge status: Home / Self Care | Source: Ambulatory Visit | Attending: Nurse Practitioner | Admitting: Nurse Practitioner

## 2023-09-20 DIAGNOSIS — I10 Essential (primary) hypertension: Secondary | ICD-10-CM | POA: Diagnosis not present

## 2023-09-20 DIAGNOSIS — J449 Chronic obstructive pulmonary disease, unspecified: Secondary | ICD-10-CM | POA: Diagnosis not present

## 2023-09-20 DIAGNOSIS — N184 Chronic kidney disease, stage 4 (severe): Secondary | ICD-10-CM | POA: Diagnosis not present

## 2023-09-20 LAB — BASIC METABOLIC PANEL WITH GFR
Anion gap: 14 (ref 5–15)
BUN: 27 mg/dL — ABNORMAL HIGH (ref 8–23)
CO2: 23 mmol/L (ref 22–32)
Calcium: 9.2 mg/dL (ref 8.9–10.3)
Chloride: 103 mmol/L (ref 98–111)
Creatinine, Ser: 2.47 mg/dL — ABNORMAL HIGH (ref 0.61–1.24)
GFR, Estimated: 25 mL/min — ABNORMAL LOW (ref >60)
Glucose, Bld: 89 mg/dL (ref 70–99)
Potassium: 4.2 mmol/L (ref 3.5–5.1)
Sodium: 140 mmol/L (ref 135–145)

## 2023-09-24 ENCOUNTER — Telehealth: Payer: Self-pay | Admitting: Nurse Practitioner

## 2023-09-24 ENCOUNTER — Other Ambulatory Visit (HOSPITAL_BASED_OUTPATIENT_CLINIC_OR_DEPARTMENT_OTHER): Payer: Self-pay

## 2023-09-24 DIAGNOSIS — L0390 Cellulitis, unspecified: Secondary | ICD-10-CM | POA: Diagnosis not present

## 2023-09-24 DIAGNOSIS — L03111 Cellulitis of right axilla: Secondary | ICD-10-CM | POA: Diagnosis not present

## 2023-09-24 DIAGNOSIS — Z6822 Body mass index (BMI) 22.0-22.9, adult: Secondary | ICD-10-CM | POA: Diagnosis not present

## 2023-09-24 MED ORDER — CEPHALEXIN 500 MG PO CAPS
500.0000 mg | ORAL_CAPSULE | Freq: Three times a day (TID) | ORAL | 0 refills | Status: AC
  Filled 2023-09-24: qty 21, 7d supply, fill #0

## 2023-09-24 NOTE — Telephone Encounter (Signed)
 Spoke with patient daughter and she states Dr.Bhutanis office has not yet received our referral advised her on our end it says pending so I called Dr.Bhutani's office and she states they still idd not have it so I refaxed the pending order.advised patient needed to be see asap so she will get him in to see Dr.Bhutani this week. Called patient daughter and advised her to call their office agaian and they will get patient tin sooner than originally planned.  She verbalize understanding.

## 2023-09-24 NOTE — Telephone Encounter (Signed)
  Patient's daughter is calling to follow up the referral for kidney doctor. She said, she called their office and was told they haven't receive referral. She gave fax number [phone] if the referral can be resend

## 2023-09-24 NOTE — Telephone Encounter (Signed)
 Dr. Link office stating they only received one sheet of paper and would like the referral to be faxed again.

## 2023-09-25 ENCOUNTER — Other Ambulatory Visit (HOSPITAL_COMMUNITY): Payer: Self-pay | Admitting: Nephrology

## 2023-09-25 DIAGNOSIS — N1831 Chronic kidney disease, stage 3a: Secondary | ICD-10-CM | POA: Diagnosis not present

## 2023-09-25 DIAGNOSIS — N189 Chronic kidney disease, unspecified: Secondary | ICD-10-CM | POA: Diagnosis not present

## 2023-09-25 DIAGNOSIS — D638 Anemia in other chronic diseases classified elsewhere: Secondary | ICD-10-CM | POA: Diagnosis not present

## 2023-09-25 DIAGNOSIS — R778 Other specified abnormalities of plasma proteins: Secondary | ICD-10-CM | POA: Diagnosis not present

## 2023-09-25 DIAGNOSIS — I5032 Chronic diastolic (congestive) heart failure: Secondary | ICD-10-CM | POA: Diagnosis not present

## 2023-09-25 DIAGNOSIS — I129 Hypertensive chronic kidney disease with stage 1 through stage 4 chronic kidney disease, or unspecified chronic kidney disease: Secondary | ICD-10-CM | POA: Diagnosis not present

## 2023-09-25 DIAGNOSIS — I2510 Atherosclerotic heart disease of native coronary artery without angina pectoris: Secondary | ICD-10-CM | POA: Diagnosis not present

## 2023-09-25 DIAGNOSIS — N179 Acute kidney failure, unspecified: Secondary | ICD-10-CM | POA: Diagnosis not present

## 2023-09-25 DIAGNOSIS — I739 Peripheral vascular disease, unspecified: Secondary | ICD-10-CM | POA: Diagnosis not present

## 2023-09-26 ENCOUNTER — Ambulatory Visit: Admitting: Nurse Practitioner

## 2023-09-30 ENCOUNTER — Other Ambulatory Visit (HOSPITAL_BASED_OUTPATIENT_CLINIC_OR_DEPARTMENT_OTHER): Payer: Self-pay

## 2023-09-30 ENCOUNTER — Other Ambulatory Visit: Payer: Self-pay

## 2023-09-30 MED ORDER — ALLOPURINOL 100 MG PO TABS
100.0000 mg | ORAL_TABLET | Freq: Every day | ORAL | 3 refills | Status: AC
  Filled 2023-09-30 – 2023-10-28 (×6): qty 90, 90d supply, fill #0
  Filled 2024-01-23: qty 90, 90d supply, fill #1

## 2023-10-01 ENCOUNTER — Ambulatory Visit (HOSPITAL_COMMUNITY)
Admission: RE | Admit: 2023-10-01 | Discharge: 2023-10-01 | Disposition: A | Discharge status: Home / Self Care | Source: Ambulatory Visit | Attending: Nephrology | Admitting: Nephrology

## 2023-10-01 ENCOUNTER — Telehealth: Payer: Self-pay | Admitting: Cardiology

## 2023-10-01 DIAGNOSIS — N1831 Chronic kidney disease, stage 3a: Secondary | ICD-10-CM | POA: Insufficient documentation

## 2023-10-01 DIAGNOSIS — N281 Cyst of kidney, acquired: Secondary | ICD-10-CM | POA: Diagnosis not present

## 2023-10-01 NOTE — Telephone Encounter (Signed)
 Daughter is calling to check to see what his kidney level needs to be to be able to reschedule cath.

## 2023-10-01 NOTE — Telephone Encounter (Signed)
 Replied via mychart.

## 2023-10-02 DIAGNOSIS — H3563 Retinal hemorrhage, bilateral: Secondary | ICD-10-CM | POA: Diagnosis not present

## 2023-10-02 DIAGNOSIS — H5360 Unspecified night blindness: Secondary | ICD-10-CM | POA: Diagnosis not present

## 2023-10-03 ENCOUNTER — Ambulatory Visit: Payer: Self-pay | Admitting: Nurse Practitioner

## 2023-10-03 ENCOUNTER — Other Ambulatory Visit (HOSPITAL_BASED_OUTPATIENT_CLINIC_OR_DEPARTMENT_OTHER): Payer: Self-pay

## 2023-10-07 ENCOUNTER — Ambulatory Visit (INDEPENDENT_AMBULATORY_CARE_PROVIDER_SITE_OTHER)

## 2023-10-07 ENCOUNTER — Other Ambulatory Visit (HOSPITAL_BASED_OUTPATIENT_CLINIC_OR_DEPARTMENT_OTHER): Payer: Self-pay

## 2023-10-07 DIAGNOSIS — I639 Cerebral infarction, unspecified: Secondary | ICD-10-CM

## 2023-10-08 ENCOUNTER — Other Ambulatory Visit (HOSPITAL_BASED_OUTPATIENT_CLINIC_OR_DEPARTMENT_OTHER): Payer: Self-pay

## 2023-10-08 ENCOUNTER — Ambulatory Visit: Payer: Self-pay | Admitting: Cardiology

## 2023-10-08 LAB — CUP PACEART REMOTE DEVICE CHECK
Date Time Interrogation Session: 20250913231151
Implantable Pulse Generator Implant Date: 20230517

## 2023-10-09 DIAGNOSIS — N2581 Secondary hyperparathyroidism of renal origin: Secondary | ICD-10-CM | POA: Diagnosis not present

## 2023-10-09 DIAGNOSIS — N179 Acute kidney failure, unspecified: Secondary | ICD-10-CM | POA: Diagnosis not present

## 2023-10-09 DIAGNOSIS — N1832 Chronic kidney disease, stage 3b: Secondary | ICD-10-CM | POA: Diagnosis not present

## 2023-10-09 DIAGNOSIS — I129 Hypertensive chronic kidney disease with stage 1 through stage 4 chronic kidney disease, or unspecified chronic kidney disease: Secondary | ICD-10-CM | POA: Diagnosis not present

## 2023-10-10 ENCOUNTER — Encounter: Payer: Self-pay | Admitting: Cardiology

## 2023-10-10 DIAGNOSIS — N189 Chronic kidney disease, unspecified: Secondary | ICD-10-CM | POA: Diagnosis not present

## 2023-10-10 NOTE — Telephone Encounter (Signed)
 I am fine with rescheduling the cath, given improvement in renal function.  He will need to come in 4 hours before the cath for prehydration with plan for diagnostic catheterization.  If there is significant disease requiring intervention, it most likely will be done in a staged fashion to minimize the amount of contrast exposure at one time.  Lonni Hanson, MD [facility]

## 2023-10-10 NOTE — Telephone Encounter (Signed)
 Unfortunately he needs an office visit before cath since his last office visit (09/06/23) would not be within 30 days of cath. (Virtual Visit is okay) He also needs a CBC and would probably be good to have BMP within 7 days of cath. When the cath is rescheduled, he should arrive 5 hours before the cath time. This will allow ~1 hour for registration and IV start and ~4 hours IV hydration prior to the cath.  Let the cath lab know that he will be coming in early for IV hydration. Let me know if you have questions or I can help. Arlean

## 2023-10-11 ENCOUNTER — Ambulatory Visit: Admitting: Nurse Practitioner

## 2023-10-14 ENCOUNTER — Encounter: Payer: Self-pay | Admitting: Nurse Practitioner

## 2023-10-14 ENCOUNTER — Ambulatory Visit: Admitting: Nurse Practitioner

## 2023-10-14 ENCOUNTER — Other Ambulatory Visit (HOSPITAL_BASED_OUTPATIENT_CLINIC_OR_DEPARTMENT_OTHER): Payer: Self-pay

## 2023-10-14 ENCOUNTER — Encounter: Attending: Nurse Practitioner | Admitting: Nurse Practitioner

## 2023-10-14 ENCOUNTER — Telehealth: Payer: Self-pay | Admitting: Cardiology

## 2023-10-14 VITALS — BP 142/60 | HR 51 | Ht 70.0 in | Wt 147.4 lb

## 2023-10-14 DIAGNOSIS — R0989 Other specified symptoms and signs involving the circulatory and respiratory systems: Secondary | ICD-10-CM | POA: Insufficient documentation

## 2023-10-14 DIAGNOSIS — I10 Essential (primary) hypertension: Secondary | ICD-10-CM | POA: Diagnosis not present

## 2023-10-14 DIAGNOSIS — I6523 Occlusion and stenosis of bilateral carotid arteries: Secondary | ICD-10-CM | POA: Diagnosis not present

## 2023-10-14 DIAGNOSIS — R0609 Other forms of dyspnea: Secondary | ICD-10-CM | POA: Diagnosis not present

## 2023-10-14 DIAGNOSIS — I2510 Atherosclerotic heart disease of native coronary artery without angina pectoris: Secondary | ICD-10-CM | POA: Insufficient documentation

## 2023-10-14 DIAGNOSIS — I480 Paroxysmal atrial fibrillation: Secondary | ICD-10-CM | POA: Insufficient documentation

## 2023-10-14 DIAGNOSIS — Z9889 Other specified postprocedural states: Secondary | ICD-10-CM | POA: Insufficient documentation

## 2023-10-14 DIAGNOSIS — I771 Stricture of artery: Secondary | ICD-10-CM | POA: Diagnosis not present

## 2023-10-14 DIAGNOSIS — E785 Hyperlipidemia, unspecified: Secondary | ICD-10-CM | POA: Insufficient documentation

## 2023-10-14 DIAGNOSIS — J449 Chronic obstructive pulmonary disease, unspecified: Secondary | ICD-10-CM | POA: Insufficient documentation

## 2023-10-14 DIAGNOSIS — Z8673 Personal history of transient ischemic attack (TIA), and cerebral infarction without residual deficits: Secondary | ICD-10-CM | POA: Insufficient documentation

## 2023-10-14 DIAGNOSIS — R5383 Other fatigue: Secondary | ICD-10-CM | POA: Diagnosis not present

## 2023-10-14 DIAGNOSIS — Z8679 Personal history of other diseases of the circulatory system: Secondary | ICD-10-CM | POA: Insufficient documentation

## 2023-10-14 NOTE — Telephone Encounter (Signed)
 Checking percert on the following patient for   Heart Cath   9/29 with Dr.End Arrive at 5:30 CATH at 10:30 Patient arriving 5 hours early for IV Hydration

## 2023-10-14 NOTE — Progress Notes (Unsigned)
 Cardiology Office Note   Date:  10/14/2023 ID:  TANYON ALIPIO, DOB [birth year], MRN [id number] PCP: Atilano Deward ORN, MD  [facility] Providers Cardiologist:  Alvan Carrier, MD     History of Present Illness Justin Lyons is a 88 y.o. male with a PMH of CAD, hypertension, hyperlipidemia, PAF, history of AAA, s/p endovascular repair, COPD, and carotid artery stenosis with left subclavian stenosis, and past history of CVA, who presents today for scheduled follow-up.  Remote history of DES to left circumflex in 2005.  Also remote history of PTCA to RCA.  Followed by EP due to history of loop recorder.  Last seen by Dr. Carrier Alvan on November 20, 2022.  At that time, patient noticed some recent progression in shortness of breath/DOE.  Echocardiogram was obtained and was benign.  Recommended that pending results would likely pursue exercise nuclear stress test.  Today he presents for scheduled follow-up.  He states he continues to note stable [facility] and DOE. Denies any chest pain, palpitations, syncope, presyncope, dizziness, orthopnea, PND, swelling or significant weight changes, acute bleeding, or claudication.   Most recent Lexi scan was considered high risk, findings were consistent with ischemia and infarction.  See full report below.  09/06/2023 - Today he presents for follow-up with a family member, who is a Charity fundraiser.  Overall doing well but does admit to some shortness of breath with exertion. Denies any chest pain, palpitations, syncope, presyncope, dizziness, orthopnea, PND, swelling or significant weight changes, acute bleeding, or claudication.  He was initially set up for right and left heart cath prior to this office visit, however this had to be deferred as labs showed progression of chronic kidney disease and he was referred to nephrology.  Most recent kidney function showed improvement with serum creatinine around 1.8.  Cardiologists are now okay with proceeding with heart  cath-see communication dated October 10, 2023.  Today he presents for follow-up with his daughter.  Continues to note some shortness of breath with exertion, seems to be worse than previously.  Not able to do the activities he is wanting to do.  At his baseline before the symptoms, daughter stated that he was usually very active. Also admits to fatigue.  Most recent labs obtained are from September 18.  I have seen a BMET panel and daughter shows me CBC that shows hemoglobin of 11 and hematocrit of 33.4. Denies any chest pain, palpitations, syncope, presyncope, dizziness, orthopnea, PND, swelling or significant weight changes, acute bleeding, or claudication.   ROS: Negative. See HPI. SH: Enjoys Hydrologist work. 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   Studies Reviewed EKG Interpretation Date/Time:  Monday October 14 2023 14:36:05 EDT Ventricular Rate:  72 PR Interval:  186 QRS Duration:  78 QT Interval:  396 QTC Calculation: 433 R Axis:   15  Text Interpretation: Normal sinus rhythm Nonspecific ST abnormality When compared with ECG of 06-Sep-2023 15:00, Nonspecific T wave abnormality no longer evident in Lateral leads Confirmed by Miriam Norris [phone]) on 10/14/2023 2:38:10 PM   EKG:  EKG Interpretation Date/Time:  Monday October 14 2023 14:36:05 EDT Ventricular Rate:  72 PR Interval:  186 QRS Duration:  78 QT Interval:  396 QTC Calculation: 433 R Axis:   15  Text Interpretation: Normal sinus rhythm Nonspecific ST abnormality When compared with ECG of 06-Sep-2023 15:00, Nonspecific T wave abnormality no longer evident in Lateral leads Confirmed by Miriam Norris [phone]) on 10/14/2023 2:38:10 PM   Echo 09/13/2023:  1. Left ventricular ejection fraction, by estimation, is 55 to 60%. Left  ventricular ejection fraction by 3D volume is 47 %. The left ventricle has  normal function. The left ventricle has no regional wall motion  abnormalities. There is mild  left  ventricular hypertrophy. Left ventricular diastolic parameters are  consistent with Grade I diastolic dysfunction (impaired relaxation). The  average left ventricular global longitudinal strain is -14.9 %.   2. Right ventricular systolic function is normal. The right ventricular  size is normal. There is normal pulmonary artery systolic pressure.   3. Left atrial size was mildly dilated.   4. The mitral valve is normal in structure. Mild mitral valve  regurgitation. No evidence of mitral stenosis.   5. The aortic valve is tricuspid. Aortic valve regurgitation is mild. No  aortic stenosis is present.   6. The inferior vena cava is normal in size with greater than 50%  respiratory variability, suggesting right atrial pressure of 3 mmHg.  Lexiscan  07/2023:    No ST deviation was noted. Baseline EKG showed non-specific ST-T changes in the inferior leads. Frequent PACs are noted. Pharmacological protocol is used.   LV perfusion is abnormal. There is evidence of ischemia. There is evidence of infarction. Defect 1: There is a medium sized reversible perfusion defect with moderate reduction in uptake present in the apical to mid inferolateral wall with hypokinesis consistent with ischemia. There is a medium sized fixed perfusion defect with moderate reduction in uptake present in the mid to basal inferolateral wall with hypokinesis consistent with infarction.   Left ventricular function is abnormal. Global function is normal. There was a single regional abnormality in the inferolateral wall. Nuclear stress EF: 56%.   Findings are consistent with ischemia and infarction. The study is high risk.  EVAR duplex 07/2023:  Summary:  Abdominal Aorta: The largest aortic diameter has decreased compared to  prior exam. Previous diameter measurement was 4.1 cm obtained on 07/02/22.  Stenosis:  Patent endovascular aneurysm repair with no evidence of endoleak.   Echo 11/2022:  1. Left ventricular ejection  fraction, by estimation, is 55 to 60%. The  left ventricle has normal function. The left ventricle has no regional  wall motion abnormalities. There is mild left ventricular hypertrophy.  Left ventricular diastolic parameters  are consistent with Grade I diastolic dysfunction (impaired relaxation).  Normal LVEDP.   2. Right ventricular systolic function is normal. The right ventricular  size is normal.   3. The mitral valve is normal in structure. Mild mitral valve  regurgitation. No evidence of mitral stenosis.   4. The aortic valve is tricuspid. Aortic valve regurgitation is mild. No  aortic stenosis is present.   5. The inferior vena cava is normal in size but collapsibility could not  be evaluated.   Comparison(s): No significant change from prior study.  Carotid duplex 06/2022:  Summary:  Right Carotid: Velocities in the right ICA are consistent with a 1-39%  stenosis.   Left Carotid: Velocities in the left ICA are consistent with a 1-39%  stenosis.   Vertebrals: Right vertebral artery demonstrates antegrade flow. Left  vertebral artery demonstrates retrograde flow.  Subclavians: Left subclavian artery flow was disturbed. Normal flow  hemodynamics were seen in the right subclavian artery.   *See table(s) above for measurements and observations.  Vascular ultrasound US  EVAR duplex 06/2022:  Summary:  Abdominal Aorta: Patent endovascular aneurysm repair with no evidence of  endoleak. The largest aortic diameter has decreased compared to prior  exam. Previous diameter measurement was 4.8 cm obtained on 06/12/2021.    Physical Exam VS:  BP (!) 142/60 (BP Location: Right Arm, Cuff Size: Normal)   Pulse (!) 51   Ht 5' 10 (1.778 m)   Wt 147 lb 6.4 oz (66.9 kg)   SpO2 100%   BMI 21.15 kg/m    Wt Readings from Last 3 Encounters:  10/14/23 147 lb 6.4 oz (66.9 kg)  09/06/23 156 lb 3.2 oz (70.9 kg)  07/29/23 154 lb 1.6 oz (69.9 kg)    GEN: Well nourished, well developed in  no acute distress NECK: No JVD; right carotid bruit , no left carotid bruit  CARDIAC: S1/S2, RRR, no murmurs, rubs, gallops RESPIRATORY:  Clear to auscultation without rales, wheezing or rhonchi  ABDOMEN: Soft, non-tender, non-distended EXTREMITIES:  No edema; No deformity   ASSESSMENT AND PLAN  CAD, DOE, fatigue Remote history of DES to left circumflex in 2005.  Also remote history of PTCA to RCA. Denies any chest pain, does admit to DOE.  See most recent abnormal Lexiscan  above. Etiology unclear. Previously reviewed with Dr. Alvan via secure chat who recommended updating Echo and if no improvement in symtpoms, then consider right and left heart cath and Dr. Alvan has agreed to right and left heart cath for patient. He is not on aspirin  due to being on Eliquis . See Echo results noted above. Continue current medication regimen. Heart healthy diet and regular cardiovascular exercise encouraged.  Will arrange right and left heart cath at this time due to patient's improved kidney function - see above.   Informed Consent   Shared Decision Making/Informed Consent The risks [stroke (1 in 1000), death (1 in 1000), kidney failure [usually temporary] (1 in 500), bleeding (1 in 200), allergic reaction [possibly serious] (1 in 200)], benefits (diagnostic support and management of coronary artery disease) and alternatives of a cardiac catheterization were discussed in detail with Mr. Frick and he is willing to proceed.    Addendum 09/10/2023: Spoke with Dr. Alvan via secure chat about patient's request ( see MyChart messages) and wanting to do a heart cath ASAP. Ran case past Dr. Alvan regarding right and left heart cath and he is agreeable to this and he stated no need to do Echo at this time. Will arrange.  I previously discussed risk versus benefits and outlined procedure to patient at last office visit with me.  He verbalized understanding was agreeable to this.  Addendum 10/15/2023 - Orders are  placed under signed and held.   PAF Denies any tachycardia or palpitations. Heart rate is well-controlled today.  Continue Lopressor . Continue Eliquis  5 mg twice daily for stroke prevention.  He is on appropriate dosage denies any bleeding issues. Heart healthy diet and regular cardiovascular exercise encouraged.   HTN Blood pressure slightly elevated but well controlled at home. Discussed to monitor BP at home at least 2 hours after medications and sitting for 5-10 minutes.  No medication changes at this time. Heart healthy diet and regular cardiovascular exercise encouraged.   HLD LDL 58 in December 2024.  Continue current medication regimen. Heart healthy diet and regular cardiovascular exercise encouraged.  Continue to follow with PCP.  AAA, s/p endovascular repair Followed closely by vascular surgery.  Most recent duplex revealed that the largest aortic diameter had decreased prior to exam.  There was evidence of patent endovascular aneurysm repair with no evidence of endoleak.  Continue follow-up with [facility] who is managing this.   Carotid artery stenosis with  left subclavian stenosis, bruit Followed closely by vascular surgery.  Carotid duplex in June 2024 revealed 1 to 39% stenosis along bilateral ICAs with left vertebral artery demonstrating retrograde flow in his left subclavian artery flow was disturbed, normal flow hemodynamics are seen in the right subclavian artery.  Does have right carotid bruit noted on exam. Have previously ordered carotid duplex.  Denies any concerning signs or symptoms.  Continue follow-up with [facility].  Hx of CVA Denies any recent issues.  No medication changes at this time.  Continue follow-up with PCP.  COPD, DOE Does admit to some stable and continued shortness of breath with exertion.  Continue follow-up with PCP.  Care and ED precautions discussed.       Dispo: Follow-up with Dr. Alvan as scheduled post cardiac catheterization or sooner if any  changes.  Signed, Almarie Crate, NP

## 2023-10-14 NOTE — H&P (View-Only) (Signed)
 Cardiology Office Note   Date:  10/14/2023 ID:  Justin Lyons, DOB [birth year], MRN [id number] PCP: Atilano Deward ORN, MD  [facility] Providers Cardiologist:  Alvan Carrier, MD     History of Present Illness Justin Lyons is a 88 y.o. male with a PMH of CAD, hypertension, hyperlipidemia, PAF, history of AAA, s/p endovascular repair, COPD, and carotid artery stenosis with left subclavian stenosis, and past history of CVA, who presents today for scheduled follow-up.  Remote history of DES to left circumflex in 2005.  Also remote history of PTCA to RCA.  Followed by EP due to history of loop recorder.  Last seen by Dr. Carrier Alvan on November 20, 2022.  At that time, patient noticed some recent progression in shortness of breath/DOE.  Echocardiogram was obtained and was benign.  Recommended that pending results would likely pursue exercise nuclear stress test.  Today he presents for scheduled follow-up.  He states he continues to note stable [facility] and DOE. Denies any chest pain, palpitations, syncope, presyncope, dizziness, orthopnea, PND, swelling or significant weight changes, acute bleeding, or claudication.   Most recent Lexi scan was considered high risk, findings were consistent with ischemia and infarction.  See full report below.  09/06/2023 - Today he presents for follow-up with a family member, who is a Charity fundraiser.  Overall doing well but does admit to some shortness of breath with exertion. Denies any chest pain, palpitations, syncope, presyncope, dizziness, orthopnea, PND, swelling or significant weight changes, acute bleeding, or claudication.  He was initially set up for right and left heart cath prior to this office visit, however this had to be deferred as labs showed progression of chronic kidney disease and he was referred to nephrology.  Most recent kidney function showed improvement with serum creatinine around 1.8.  Cardiologists are now okay with proceeding with heart  cath-see communication dated October 10, 2023.  Today he presents for follow-up with his daughter.  Continues to note some shortness of breath with exertion, seems to be worse than previously.  Not able to do the activities he is wanting to do.  At his baseline before the symptoms, daughter stated that he was usually very active. Also admits to fatigue.  Most recent labs obtained are from September 18.  I have seen a BMET panel and daughter shows me CBC that shows hemoglobin of 11 and hematocrit of 33.4. Denies any chest pain, palpitations, syncope, presyncope, dizziness, orthopnea, PND, swelling or significant weight changes, acute bleeding, or claudication.   ROS: Negative. See HPI. SH: Enjoys Hydrologist work. 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   Studies Reviewed EKG Interpretation Date/Time:  Monday October 14 2023 14:36:05 EDT Ventricular Rate:  72 PR Interval:  186 QRS Duration:  78 QT Interval:  396 QTC Calculation: 433 R Axis:   15  Text Interpretation: Normal sinus rhythm Nonspecific ST abnormality When compared with ECG of 06-Sep-2023 15:00, Nonspecific T wave abnormality no longer evident in Lateral leads Confirmed by Miriam Norris [phone]) on 10/14/2023 2:38:10 PM   EKG:  EKG Interpretation Date/Time:  Monday October 14 2023 14:36:05 EDT Ventricular Rate:  72 PR Interval:  186 QRS Duration:  78 QT Interval:  396 QTC Calculation: 433 R Axis:   15  Text Interpretation: Normal sinus rhythm Nonspecific ST abnormality When compared with ECG of 06-Sep-2023 15:00, Nonspecific T wave abnormality no longer evident in Lateral leads Confirmed by Miriam Norris [phone]) on 10/14/2023 2:38:10 PM   Echo 09/13/2023:  1. Left ventricular ejection fraction, by estimation, is 55 to 60%. Left  ventricular ejection fraction by 3D volume is 47 %. The left ventricle has  normal function. The left ventricle has no regional wall motion  abnormalities. There is mild  left  ventricular hypertrophy. Left ventricular diastolic parameters are  consistent with Grade I diastolic dysfunction (impaired relaxation). The  average left ventricular global longitudinal strain is -14.9 %.   2. Right ventricular systolic function is normal. The right ventricular  size is normal. There is normal pulmonary artery systolic pressure.   3. Left atrial size was mildly dilated.   4. The mitral valve is normal in structure. Mild mitral valve  regurgitation. No evidence of mitral stenosis.   5. The aortic valve is tricuspid. Aortic valve regurgitation is mild. No  aortic stenosis is present.   6. The inferior vena cava is normal in size with greater than 50%  respiratory variability, suggesting right atrial pressure of 3 mmHg.  Lexiscan  07/2023:    No ST deviation was noted. Baseline EKG showed non-specific ST-T changes in the inferior leads. Frequent PACs are noted. Pharmacological protocol is used.   LV perfusion is abnormal. There is evidence of ischemia. There is evidence of infarction. Defect 1: There is a medium sized reversible perfusion defect with moderate reduction in uptake present in the apical to mid inferolateral wall with hypokinesis consistent with ischemia. There is a medium sized fixed perfusion defect with moderate reduction in uptake present in the mid to basal inferolateral wall with hypokinesis consistent with infarction.   Left ventricular function is abnormal. Global function is normal. There was a single regional abnormality in the inferolateral wall. Nuclear stress EF: 56%.   Findings are consistent with ischemia and infarction. The study is high risk.  EVAR duplex 07/2023:  Summary:  Abdominal Aorta: The largest aortic diameter has decreased compared to  prior exam. Previous diameter measurement was 4.1 cm obtained on 07/02/22.  Stenosis:  Patent endovascular aneurysm repair with no evidence of endoleak.   Echo 11/2022:  1. Left ventricular ejection  fraction, by estimation, is 55 to 60%. The  left ventricle has normal function. The left ventricle has no regional  wall motion abnormalities. There is mild left ventricular hypertrophy.  Left ventricular diastolic parameters  are consistent with Grade I diastolic dysfunction (impaired relaxation).  Normal LVEDP.   2. Right ventricular systolic function is normal. The right ventricular  size is normal.   3. The mitral valve is normal in structure. Mild mitral valve  regurgitation. No evidence of mitral stenosis.   4. The aortic valve is tricuspid. Aortic valve regurgitation is mild. No  aortic stenosis is present.   5. The inferior vena cava is normal in size but collapsibility could not  be evaluated.   Comparison(s): No significant change from prior study.  Carotid duplex 06/2022:  Summary:  Right Carotid: Velocities in the right ICA are consistent with a 1-39%  stenosis.   Left Carotid: Velocities in the left ICA are consistent with a 1-39%  stenosis.   Vertebrals: Right vertebral artery demonstrates antegrade flow. Left  vertebral artery demonstrates retrograde flow.  Subclavians: Left subclavian artery flow was disturbed. Normal flow  hemodynamics were seen in the right subclavian artery.   *See table(s) above for measurements and observations.  Vascular ultrasound US  EVAR duplex 06/2022:  Summary:  Abdominal Aorta: Patent endovascular aneurysm repair with no evidence of  endoleak. The largest aortic diameter has decreased compared to prior  exam. Previous diameter measurement was 4.8 cm obtained on 06/12/2021.    Physical Exam VS:  BP (!) 142/60 (BP Location: Right Arm, Cuff Size: Normal)   Pulse (!) 51   Ht 5' 10 (1.778 m)   Wt 147 lb 6.4 oz (66.9 kg)   SpO2 100%   BMI 21.15 kg/m    Wt Readings from Last 3 Encounters:  10/14/23 147 lb 6.4 oz (66.9 kg)  09/06/23 156 lb 3.2 oz (70.9 kg)  07/29/23 154 lb 1.6 oz (69.9 kg)    GEN: Well nourished, well developed in  no acute distress NECK: No JVD; right carotid bruit , no left carotid bruit  CARDIAC: S1/S2, RRR, no murmurs, rubs, gallops RESPIRATORY:  Clear to auscultation without rales, wheezing or rhonchi  ABDOMEN: Soft, non-tender, non-distended EXTREMITIES:  No edema; No deformity   ASSESSMENT AND PLAN  CAD, DOE, fatigue Remote history of DES to left circumflex in 2005.  Also remote history of PTCA to RCA. Denies any chest pain, does admit to DOE.  See most recent abnormal Lexiscan  above. Etiology unclear. Previously reviewed with Dr. Alvan via secure chat who recommended updating Echo and if no improvement in symtpoms, then consider right and left heart cath and Dr. Alvan has agreed to right and left heart cath for patient. He is not on aspirin  due to being on Eliquis . See Echo results noted above. Continue current medication regimen. Heart healthy diet and regular cardiovascular exercise encouraged.  Will arrange right and left heart cath at this time due to patient's improved kidney function - see above.   Informed Consent   Shared Decision Making/Informed Consent The risks [stroke (1 in 1000), death (1 in 1000), kidney failure [usually temporary] (1 in 500), bleeding (1 in 200), allergic reaction [possibly serious] (1 in 200)], benefits (diagnostic support and management of coronary artery disease) and alternatives of a cardiac catheterization were discussed in detail with Mr. Frick and he is willing to proceed.    Addendum 09/10/2023: Spoke with Dr. Alvan via secure chat about patient's request ( see MyChart messages) and wanting to do a heart cath ASAP. Ran case past Dr. Alvan regarding right and left heart cath and he is agreeable to this and he stated no need to do Echo at this time. Will arrange.  I previously discussed risk versus benefits and outlined procedure to patient at last office visit with me.  He verbalized understanding was agreeable to this.  Addendum 10/15/2023 - Orders are  placed under signed and held.   PAF Denies any tachycardia or palpitations. Heart rate is well-controlled today.  Continue Lopressor . Continue Eliquis  5 mg twice daily for stroke prevention.  He is on appropriate dosage denies any bleeding issues. Heart healthy diet and regular cardiovascular exercise encouraged.   HTN Blood pressure slightly elevated but well controlled at home. Discussed to monitor BP at home at least 2 hours after medications and sitting for 5-10 minutes.  No medication changes at this time. Heart healthy diet and regular cardiovascular exercise encouraged.   HLD LDL 58 in December 2024.  Continue current medication regimen. Heart healthy diet and regular cardiovascular exercise encouraged.  Continue to follow with PCP.  AAA, s/p endovascular repair Followed closely by vascular surgery.  Most recent duplex revealed that the largest aortic diameter had decreased prior to exam.  There was evidence of patent endovascular aneurysm repair with no evidence of endoleak.  Continue follow-up with [facility] who is managing this.   Carotid artery stenosis with  left subclavian stenosis, bruit Followed closely by vascular surgery.  Carotid duplex in June 2024 revealed 1 to 39% stenosis along bilateral ICAs with left vertebral artery demonstrating retrograde flow in his left subclavian artery flow was disturbed, normal flow hemodynamics are seen in the right subclavian artery.  Does have right carotid bruit noted on exam. Have previously ordered carotid duplex.  Denies any concerning signs or symptoms.  Continue follow-up with [facility].  Hx of CVA Denies any recent issues.  No medication changes at this time.  Continue follow-up with PCP.  COPD, DOE Does admit to some stable and continued shortness of breath with exertion.  Continue follow-up with PCP.  Care and ED precautions discussed.       Dispo: Follow-up with Dr. Alvan as scheduled post cardiac catheterization or sooner if any  changes.  Signed, Almarie Crate, NP

## 2023-10-14 NOTE — Progress Notes (Signed)
 Remote Loop Recorder Transmission

## 2023-10-14 NOTE — Patient Instructions (Addendum)
 Medication Instructions:  Your physician recommends that you continue on your current medications as directed. Please refer to the Current Medication list given to you today.  Labwork: None   Testing/Procedures: Please refer to the CATH Instructions provided to you today.   Follow-Up: Your physician recommends that you schedule a follow-up appointment in:  As scheduled on 11/14/23 @2 :20 PM with Dr.Branch   Any Other Special Instructions Will Be Listed Below (If Applicable).  If you need a refill on your cardiac medications before your next appointment, please call your pharmacy.

## 2023-10-15 NOTE — Addendum Note (Signed)
 Addended by: MIRIAM NORRIS on: 10/15/2023 11:14 AM   Modules accepted: Orders

## 2023-10-16 NOTE — Progress Notes (Signed)
 Remote Loop Recorder Transmission

## 2023-10-17 ENCOUNTER — Encounter

## 2023-10-17 ENCOUNTER — Telehealth: Payer: Self-pay | Admitting: *Deleted

## 2023-10-17 NOTE — Telephone Encounter (Signed)
 10/10/23 renal panel -09/25/23 CBC results in [facility]

## 2023-10-17 NOTE — Telephone Encounter (Signed)
 Cardiac Catheterization scheduled at [facility] for: Monday October 21, 2023 10:30 AM Arrival time [facility] Main Entrance A at: 5:30 AM-pre-procedure hydration  Diet: -Nothing to eat after midnight.  Hydration: -May drink clear liquids until 2 hours before the procedure.  Approved liquids: Water, clear tea, black coffee, fruit juices-non-citric and without pulp,Gatorade, plain Jello/popsicles.  No PO hydration (16 oz water) 2 hours before-going in early for IVF  Medication instructions: -Hold:  Eliquis -none 10/19/23 until post procedure -Other usual morning medications can be taken including aspirin  81 mg.  Plan to go home the same day, you will only stay overnight if medically necessary.  You must have responsible adult to drive you home.  Someone must be with you the first 24 hours after you arrive home.  Reviewed procedure instructions/pre-procedure hydration with Stacy (DPR).

## 2023-10-21 ENCOUNTER — Ambulatory Visit: Admitting: Cardiology

## 2023-10-21 ENCOUNTER — Ambulatory Visit (HOSPITAL_COMMUNITY)
Admission: RE | Admit: 2023-10-21 | Discharge: 2023-10-21 | Disposition: A | Discharge status: Home / Self Care | Attending: Internal Medicine | Admitting: Internal Medicine

## 2023-10-21 ENCOUNTER — Other Ambulatory Visit (HOSPITAL_BASED_OUTPATIENT_CLINIC_OR_DEPARTMENT_OTHER): Payer: Self-pay

## 2023-10-21 ENCOUNTER — Other Ambulatory Visit: Payer: Self-pay

## 2023-10-21 ENCOUNTER — Ambulatory Visit (HOSPITAL_COMMUNITY)
Admission: RE | Disposition: A | Discharge status: Home / Self Care | Source: Home / Self Care | Attending: Internal Medicine

## 2023-10-21 DIAGNOSIS — R0609 Other forms of dyspnea: Secondary | ICD-10-CM

## 2023-10-21 DIAGNOSIS — N189 Chronic kidney disease, unspecified: Secondary | ICD-10-CM | POA: Diagnosis not present

## 2023-10-21 DIAGNOSIS — Z8679 Personal history of other diseases of the circulatory system: Secondary | ICD-10-CM | POA: Insufficient documentation

## 2023-10-21 DIAGNOSIS — I480 Paroxysmal atrial fibrillation: Secondary | ICD-10-CM | POA: Insufficient documentation

## 2023-10-21 DIAGNOSIS — I6522 Occlusion and stenosis of left carotid artery: Secondary | ICD-10-CM | POA: Insufficient documentation

## 2023-10-21 DIAGNOSIS — R0602 Shortness of breath: Secondary | ICD-10-CM | POA: Diagnosis not present

## 2023-10-21 DIAGNOSIS — E785 Hyperlipidemia, unspecified: Secondary | ICD-10-CM | POA: Diagnosis not present

## 2023-10-21 DIAGNOSIS — Z8673 Personal history of transient ischemic attack (TIA), and cerebral infarction without residual deficits: Secondary | ICD-10-CM | POA: Insufficient documentation

## 2023-10-21 DIAGNOSIS — I272 Pulmonary hypertension, unspecified: Secondary | ICD-10-CM | POA: Diagnosis not present

## 2023-10-21 DIAGNOSIS — R9439 Abnormal result of other cardiovascular function study: Secondary | ICD-10-CM | POA: Insufficient documentation

## 2023-10-21 DIAGNOSIS — I2582 Chronic total occlusion of coronary artery: Secondary | ICD-10-CM | POA: Insufficient documentation

## 2023-10-21 DIAGNOSIS — J449 Chronic obstructive pulmonary disease, unspecified: Secondary | ICD-10-CM | POA: Diagnosis not present

## 2023-10-21 DIAGNOSIS — I2510 Atherosclerotic heart disease of native coronary artery without angina pectoris: Secondary | ICD-10-CM | POA: Insufficient documentation

## 2023-10-21 DIAGNOSIS — I1310 Hypertensive heart and chronic kidney disease without heart failure, with stage 1 through stage 4 chronic kidney disease, or unspecified chronic kidney disease: Secondary | ICD-10-CM | POA: Diagnosis not present

## 2023-10-21 HISTORY — PX: RIGHT/LEFT HEART CATH AND CORONARY ANGIOGRAPHY: CATH118266

## 2023-10-21 HISTORY — PX: CORONARY PRESSURE/FFR WITH 3D MAPPING: CATH118309

## 2023-10-21 LAB — POCT I-STAT 7, (LYTES, BLD GAS, ICA,H+H)
Acid-base deficit: 2 mmol/L (ref 0.0–2.0)
Bicarbonate: 22 mmol/L (ref 20.0–28.0)
Calcium, Ion: 1.19 mmol/L (ref 1.15–1.40)
HCT: 30 % — ABNORMAL LOW (ref 39.0–52.0)
Hemoglobin: 10.2 g/dL — ABNORMAL LOW (ref 13.0–17.0)
O2 Saturation: 94 %
Potassium: 3.8 mmol/L (ref 3.5–5.1)
Sodium: 141 mmol/L (ref 135–145)
TCO2: 23 mmol/L (ref 22–32)
pCO2 arterial: 35.8 mmHg (ref 32–48)
pH, Arterial: 7.397 (ref 7.35–7.45)
pO2, Arterial: 69 mmHg — ABNORMAL LOW (ref 83–108)

## 2023-10-21 LAB — POCT I-STAT EG7
Acid-base deficit: 2 mmol/L (ref 0.0–2.0)
Bicarbonate: 23.3 mmol/L (ref 20.0–28.0)
Calcium, Ion: 1.15 mmol/L (ref 1.15–1.40)
HCT: 30 % — ABNORMAL LOW (ref 39.0–52.0)
Hemoglobin: 10.2 g/dL — ABNORMAL LOW (ref 13.0–17.0)
O2 Saturation: 63 %
Potassium: 3.7 mmol/L (ref 3.5–5.1)
Sodium: 143 mmol/L (ref 135–145)
TCO2: 25 mmol/L (ref 22–32)
pCO2, Ven: 40.1 mmHg — ABNORMAL LOW (ref 44–60)
pH, Ven: 7.373 (ref 7.25–7.43)
pO2, Ven: 34 mmHg (ref 32–45)

## 2023-10-21 LAB — POCT ACTIVATED CLOTTING TIME
Activated Clotting Time: 233 s
Activated Clotting Time: 250 s

## 2023-10-21 SURGERY — RIGHT/LEFT HEART CATH AND CORONARY ANGIOGRAPHY
Anesthesia: LOCAL

## 2023-10-21 MED ORDER — LABETALOL HCL 5 MG/ML IV SOLN: 10.0000 mg | INTRAVENOUS | Status: DC | PRN

## 2023-10-21 MED ORDER — SODIUM CHLORIDE 0.9 % IV SOLN: 250.0000 mL | INTRAVENOUS | Status: DC | PRN

## 2023-10-21 MED ORDER — ASPIRIN 81 MG PO CHEW: 81.0000 mg | CHEWABLE_TABLET | ORAL | Status: DC

## 2023-10-21 MED ORDER — ISOSORBIDE MONONITRATE ER 30 MG PO TB24
15.0000 mg | ORAL_TABLET | Freq: Every day | ORAL | 5 refills | Status: AC
  Filled 2023-10-21: qty 30, 60d supply, fill #0
  Filled 2023-12-13: qty 30, 60d supply, fill #1
  Filled 2024-02-11: qty 30, 60d supply, fill #2

## 2023-10-21 MED ORDER — SODIUM CHLORIDE 0.9% FLUSH: 3.0000 mL | Freq: Two times a day (BID) | INTRAVENOUS | Status: DC

## 2023-10-21 MED ORDER — ACETAMINOPHEN 325 MG PO TABS: 650.0000 mg | ORAL_TABLET | ORAL | Status: DC | PRN

## 2023-10-21 MED ORDER — SODIUM CHLORIDE 0.9% FLUSH: 3.0000 mL | INTRAVENOUS | Status: DC | PRN

## 2023-10-21 MED ORDER — NITROGLYCERIN 1 MG/10 ML FOR IR/CATH LAB
INTRA_ARTERIAL | Status: AC
  Filled 2023-10-21: qty 10

## 2023-10-21 MED ORDER — VERAPAMIL HCL 2.5 MG/ML IV SOLN
INTRAVENOUS | Status: DC | PRN
  Administered 2023-10-21: 10 mL via INTRA_ARTERIAL

## 2023-10-21 MED ORDER — SODIUM CHLORIDE 0.9 % WEIGHT BASED INFUSION
1.0000 mL/kg/h | INTRAVENOUS | Status: DC
  Administered 2023-10-21: 1 mL/kg/h via INTRAVENOUS

## 2023-10-21 MED ORDER — SODIUM CHLORIDE 0.9 % WEIGHT BASED INFUSION
3.0000 mL/kg/h | INTRAVENOUS | Status: AC
  Administered 2023-10-21: 3 mL/kg/h via INTRAVENOUS

## 2023-10-21 MED ORDER — LIDOCAINE HCL (PF) 1 % IJ SOLN
INTRAMUSCULAR | Status: DC | PRN
  Administered 2023-10-21: 2 mL via INTRADERMAL

## 2023-10-21 MED ORDER — HEPARIN SODIUM (PORCINE) 1000 UNIT/ML IJ SOLN
INTRAMUSCULAR | Status: DC | PRN
  Administered 2023-10-21: 3500 [IU] via INTRAVENOUS
  Administered 2023-10-21: 2500 [IU] via INTRAVENOUS
  Administered 2023-10-21: 3500 [IU] via INTRAVENOUS

## 2023-10-21 MED ORDER — ADENOSINE (DIAGNOSTIC) 140MCG/KG/MIN
INTRAVENOUS | Status: DC | PRN
  Administered 2023-10-21: 140 ug/kg/min via INTRAVENOUS

## 2023-10-21 MED ORDER — LIDOCAINE HCL (PF) 1 % IJ SOLN
INTRAMUSCULAR | Status: AC
  Filled 2023-10-21: qty 30

## 2023-10-21 MED ORDER — ADENOSINE 12 MG/4ML IV SOLN
INTRAVENOUS | Status: AC
  Filled 2023-10-21: qty 16

## 2023-10-21 MED ORDER — HEPARIN (PORCINE) IN NACL 1000-0.9 UT/500ML-% IV SOLN
INTRAVENOUS | Status: DC | PRN
  Administered 2023-10-21 (×2): 500 mL

## 2023-10-21 MED ORDER — SODIUM CHLORIDE 0.9 % IV SOLN
INTRAVENOUS | Status: DC
Start: 2023-10-21 — End: 2023-10-21

## 2023-10-21 MED ORDER — HEPARIN SODIUM (PORCINE) 1000 UNIT/ML IJ SOLN
INTRAMUSCULAR | Status: AC
  Filled 2023-10-21: qty 10

## 2023-10-21 MED ORDER — VERAPAMIL HCL 2.5 MG/ML IV SOLN
INTRAVENOUS | Status: AC
  Filled 2023-10-21: qty 2

## 2023-10-21 MED ORDER — ONDANSETRON HCL 4 MG/2ML IJ SOLN: 4.0000 mg | Freq: Four times a day (QID) | INTRAMUSCULAR | Status: DC | PRN

## 2023-10-21 MED ORDER — IOHEXOL 350 MG/ML SOLN
INTRAVENOUS | Status: DC | PRN
  Administered 2023-10-21: 52 mL

## 2023-10-21 MED ORDER — HYDRALAZINE HCL 20 MG/ML IJ SOLN: 10.0000 mg | INTRAMUSCULAR | Status: DC | PRN

## 2023-10-21 SURGICAL SUPPLY — 12 items
CATH 5FR JL3.5 JR4 ANG PIG MP (CATHETERS) IMPLANT
CATH BALLN WEDGE 5F 110CM (CATHETERS) IMPLANT
CATH LAUNCHER 5F EBU3.5 (CATHETERS) IMPLANT
DEVICE RAD TR BAND REGULAR (VASCULAR PRODUCTS) IMPLANT
GLIDESHEATH SLEND SS 6F .021 (SHEATH) IMPLANT
GUIDEWIRE .025 260CM (WIRE) IMPLANT
GUIDEWIRE INQWIRE 1.5J.035X260 (WIRE) IMPLANT
GUIDEWIRE PRESSURE X 175 (WIRE) IMPLANT
KIT ESSENTIALS PG (KITS) IMPLANT
PACK CARDIAC CATHETERIZATION (CUSTOM PROCEDURE TRAY) ×2 IMPLANT
SET ATX-X65L (MISCELLANEOUS) IMPLANT
SHEATH GLIDE SLENDER 4/5FR (SHEATH) IMPLANT

## 2023-10-21 NOTE — Discharge Instructions (Signed)

## 2023-10-21 NOTE — Interval H&P Note (Signed)
 History and Physical Interval Note:  10/21/2023 12:21 PM  Justin Lyons  has presented today for surgery, with the diagnosis of short of breath and abnormal stress test.  The various methods of treatment have been discussed with the patient and family. After consideration of risks, benefits and other options for treatment, the patient has consented to  Procedure(s): RIGHT/LEFT HEART CATH AND CORONARY ANGIOGRAPHY (N/A) as a surgical intervention.  The patient's history has been reviewed, patient examined, no change in status, stable for surgery.  I have reviewed the patient's chart and labs.  Questions were answered to the patient's satisfaction.    Cath Lab Visit (complete for each Cath Lab visit)  Clinical Evaluation Leading to the Procedure:   ACS: No.  Non-ACS:    Anginal/Heart Failure Classification: NYHA class II  Anti-ischemic medical therapy: Minimal Therapy (1 class of medications)  Non-Invasive Test Results: High-risk stress test findings: cardiac mortality >3%/year  Prior CABG: No previous CABG  Ruxin Ransome

## 2023-10-22 ENCOUNTER — Other Ambulatory Visit: Payer: Self-pay

## 2023-10-22 ENCOUNTER — Encounter (HOSPITAL_COMMUNITY): Payer: Self-pay | Admitting: Internal Medicine

## 2023-10-22 DIAGNOSIS — I10 Essential (primary) hypertension: Secondary | ICD-10-CM | POA: Diagnosis not present

## 2023-10-22 DIAGNOSIS — J449 Chronic obstructive pulmonary disease, unspecified: Secondary | ICD-10-CM | POA: Diagnosis not present

## 2023-10-22 DIAGNOSIS — N289 Disorder of kidney and ureter, unspecified: Secondary | ICD-10-CM

## 2023-10-22 MED FILL — Nitroglycerin IV Soln 100 MCG/ML in D5W: INTRA_ARTERIAL | Qty: 10 | Status: AC

## 2023-10-23 DIAGNOSIS — Z6822 Body mass index (BMI) 22.0-22.9, adult: Secondary | ICD-10-CM | POA: Diagnosis not present

## 2023-10-23 DIAGNOSIS — H6121 Impacted cerumen, right ear: Secondary | ICD-10-CM | POA: Diagnosis not present

## 2023-10-23 DIAGNOSIS — H9191 Unspecified hearing loss, right ear: Secondary | ICD-10-CM | POA: Diagnosis not present

## 2023-10-26 ENCOUNTER — Other Ambulatory Visit (HOSPITAL_BASED_OUTPATIENT_CLINIC_OR_DEPARTMENT_OTHER): Payer: Self-pay

## 2023-10-28 ENCOUNTER — Other Ambulatory Visit (HOSPITAL_BASED_OUTPATIENT_CLINIC_OR_DEPARTMENT_OTHER): Payer: Self-pay

## 2023-10-30 NOTE — Progress Notes (Signed)
 Remote Loop Recorder Transmission

## 2023-10-31 DIAGNOSIS — E509 Vitamin A deficiency, unspecified: Secondary | ICD-10-CM | POA: Diagnosis not present

## 2023-10-31 DIAGNOSIS — R809 Proteinuria, unspecified: Secondary | ICD-10-CM | POA: Diagnosis not present

## 2023-10-31 DIAGNOSIS — D631 Anemia in chronic kidney disease: Secondary | ICD-10-CM | POA: Diagnosis not present

## 2023-10-31 DIAGNOSIS — H5360 Unspecified night blindness: Secondary | ICD-10-CM | POA: Diagnosis not present

## 2023-10-31 DIAGNOSIS — N1831 Chronic kidney disease, stage 3a: Secondary | ICD-10-CM | POA: Diagnosis not present

## 2023-11-01 ENCOUNTER — Other Ambulatory Visit (HOSPITAL_BASED_OUTPATIENT_CLINIC_OR_DEPARTMENT_OTHER): Payer: Self-pay

## 2023-11-01 DIAGNOSIS — Z6821 Body mass index (BMI) 21.0-21.9, adult: Secondary | ICD-10-CM | POA: Diagnosis not present

## 2023-11-01 DIAGNOSIS — H9201 Otalgia, right ear: Secondary | ICD-10-CM | POA: Diagnosis not present

## 2023-11-01 MED ORDER — FLUZONE HIGH-DOSE 0.5 ML IM SUSY
0.5000 mL | PREFILLED_SYRINGE | Freq: Once | INTRAMUSCULAR | 0 refills | Status: AC
  Filled 2023-11-01: qty 0.5, 1d supply, fill #0

## 2023-11-01 MED ORDER — OFLOXACIN 0.3 % OT SOLN
OTIC | 0 refills | Status: AC
  Filled 2023-11-01: qty 5, 15d supply, fill #0

## 2023-11-07 ENCOUNTER — Encounter

## 2023-11-07 DIAGNOSIS — N1832 Chronic kidney disease, stage 3b: Secondary | ICD-10-CM | POA: Diagnosis not present

## 2023-11-07 DIAGNOSIS — D638 Anemia in other chronic diseases classified elsewhere: Secondary | ICD-10-CM | POA: Diagnosis not present

## 2023-11-07 DIAGNOSIS — I5032 Chronic diastolic (congestive) heart failure: Secondary | ICD-10-CM | POA: Diagnosis not present

## 2023-11-07 DIAGNOSIS — R809 Proteinuria, unspecified: Secondary | ICD-10-CM | POA: Diagnosis not present

## 2023-11-08 ENCOUNTER — Ambulatory Visit (INDEPENDENT_AMBULATORY_CARE_PROVIDER_SITE_OTHER)

## 2023-11-08 DIAGNOSIS — I2510 Atherosclerotic heart disease of native coronary artery without angina pectoris: Secondary | ICD-10-CM | POA: Diagnosis not present

## 2023-11-10 LAB — CUP PACEART REMOTE DEVICE CHECK
Date Time Interrogation Session: 20251016232049
Implantable Pulse Generator Implant Date: 20230517

## 2023-11-12 NOTE — Progress Notes (Signed)
 Remote Loop Recorder Transmission

## 2023-11-14 ENCOUNTER — Encounter: Payer: Self-pay | Admitting: *Deleted

## 2023-11-14 ENCOUNTER — Other Ambulatory Visit (HOSPITAL_BASED_OUTPATIENT_CLINIC_OR_DEPARTMENT_OTHER): Payer: Self-pay

## 2023-11-14 ENCOUNTER — Ambulatory Visit: Attending: Cardiology | Admitting: Cardiology

## 2023-11-14 ENCOUNTER — Encounter: Payer: Self-pay | Admitting: Cardiology

## 2023-11-14 VITALS — BP 122/78 | HR 78 | Ht 70.0 in | Wt 147.0 lb

## 2023-11-14 DIAGNOSIS — I10 Essential (primary) hypertension: Secondary | ICD-10-CM

## 2023-11-14 DIAGNOSIS — R0609 Other forms of dyspnea: Secondary | ICD-10-CM | POA: Diagnosis not present

## 2023-11-14 DIAGNOSIS — R0602 Shortness of breath: Secondary | ICD-10-CM | POA: Diagnosis not present

## 2023-11-14 DIAGNOSIS — I2510 Atherosclerotic heart disease of native coronary artery without angina pectoris: Secondary | ICD-10-CM

## 2023-11-14 MED ORDER — METOPROLOL TARTRATE 37.5 MG PO TABS
37.5000 mg | ORAL_TABLET | Freq: Two times a day (BID) | ORAL | 6 refills | Status: AC
  Filled 2023-11-14: qty 60, 30d supply, fill #0
  Filled 2023-12-16: qty 60, 30d supply, fill #1
  Filled 2024-01-13: qty 40, 20d supply, fill #2
  Filled 2024-01-13: qty 20, 10d supply, fill #2
  Filled 2024-02-07: qty 20, 10d supply, fill #3
  Filled 2024-02-07: qty 40, 20d supply, fill #3

## 2023-11-14 NOTE — Progress Notes (Signed)
 Clinical Summary Mr. Lina is a 88 y.o.male seen today for follow up of the following medical problems.    1. CAD - remote history of caths somewhat unclear. From available clinic notes DES to LCX in 2005. Notes mention prior to that a remote history of PTCA to RCA    - 05/2021 LVEF 60-65%, grade I , normal RV, mild MR  07/2023 nuclear stress: apical to mid inferolateral ischemia 08/2023 echo: LVEF 55-60%, no WMAs, grade I dd, noraml RV 09/2023 RHC/LHC: ostial LM 50% negative by FFR, LAD mild diffuse disease, prox LCX 70% with stent ISR, OM1 50%, OM2 50%, RCA CTO with left to right collaterals.  Mean PA 27   PCWP 12, CI 2.9, LVEDP 14 - recs were to treat medically, if refractory symptoms could consider PCI to LCX. Imdur was added.  - breathing not improved. No chest pains - home check bp's low 100s/60s.  - DOE at 1/2 block. Started about 6 months ago.    Other medical problems not addressed this visit   2. AAA - s/p endovascular repair - followed by vascular, last visit 05/2021 - last visit 06/2022   3. Carotid stenosis with left subclavian stenosis - 11/2019 US   mild bilaterally.  - bp's need to be checked in right arm - last visit 06/2022     4. COPD - managed by pcp - reports some limitations on inhlares in the past due to side effects.  - 06/2023 PFTs minimal obstruction, severe diffusion defect 03/2017 CT Moderate to severe centrilobular emphysema with mild diffuse bronchial wall thickening   5. HTN - had some low bp's, pcp lowered lopressor  to 12.5mg  bid - due to left subclavian stenosis, bp's must be measured in right arm   - home bp's typically 130s/60s   6. Hyperlipidemia - labs followed by pcp    05/2021 TC 98 TG 63 HDL 28 LDL 57 12/2021 TC 96 TG 74 HDL 31 LDL 49     7. CVA 05/2021 - loop recorder followed by EP - afib was detected 10/15/21, started on eliquis    8. PAF - detected by loop recorder 10/15/21, lasted 3 hrs, was asymptomatic - denies any  palpitaitons - no bleeding on eliquis .   9. CKD -followed by Dr Rachele Past Medical History:  Diagnosis Date   AAA (abdominal aortic aneurysm)    06/02/2003 old BPG   Arthritis    Asthma    Cancer (HCC)    Skin   Carotid artery occlusion    Carotid bruit    COPD (chronic obstructive pulmonary disease) (HCC)    Coronary artery disease    Diverticulosis of colon    Erectile dysfunction    Hemorrhoids, internal    History of compression fracture of spine    History of gout    Hyperlipidemia    Hypertension    Knee pain    Myocardial infarction (HCC) 01/23/2003   Peripheral vascular disease      Allergies  Allergen Reactions   Penicillins Rash   Sulfonamide Derivatives Rash     Current Outpatient Medications  Medication Sig Dispense Refill   albuterol  (PROVENTIL ) (2.5 MG/3ML) 0.083% nebulizer solution Take 2.5 mg by nebulization every 6 (six) hours as needed for wheezing or shortness of breath.     albuterol  (VENTOLIN  HFA) 108 (90 Base) MCG/ACT inhaler Inhale 1-2 puffs into the lungs every 6 (six) hours as needed for wheezing or shortness of breath.     allopurinol  (  ZYLOPRIM ) 100 MG tablet Take 100 mg by mouth daily.     allopurinol  (ZYLOPRIM ) 100 MG tablet Take 1 tablet (100 mg total) by mouth daily. 90 tablet 3   apixaban  (ELIQUIS ) 2.5 MG TABS tablet TAKE 1 TABLET TWICE DAILY (DISCONTINUE PLAVIX ) 180 tablet 1   cephALEXin  (KEFLEX ) 500 MG capsule Take 1 capsule (500 mg total) by mouth 3 (three) times daily. 21 capsule 0   Cholecalciferol  (VITAMIN D ) 1000 UNITS capsule Take 1,000 Units by mouth daily.     diclofenac  (VOLTAREN ) 75 MG EC tablet Take 1 tablet by mouth daily. (Patient not taking: Reported on 10/14/2023)     isosorbide mononitrate (IMDUR) 30 MG 24 hr tablet Take 0.5 tablets (15 mg total) by mouth daily. 30 tablet 5   metoprolol  tartrate (LOPRESSOR ) 25 MG tablet Take 1 tablet (25 mg total) by mouth 2 (two) times daily. 180 tablet 3   nitroGLYCERIN  (NITROSTAT )  0.4 MG SL tablet Place 1 tablet (0.4 mg total) under the tongue every 5 (five) minutes x 3 doses as needed (if no relief after 3rd dose proceed to ED or call 911). 25 tablet 2   ofloxacin (FLOXIN) 0.3 % OTIC solution 10 Drop(s)s in affected ear every day for 7 days 5 mL 0   pantoprazole  (PROTONIX ) 40 MG tablet Take 1 tablet (40 mg total) by mouth daily for reflux. 90 tablet 3   rosuvastatin  (CRESTOR ) 40 MG tablet Take 1 tablet (40 mg total) by mouth at bedtime. Replacing Lipitor . 90 tablet 3   No current facility-administered medications for this visit.     Past Surgical History:  Procedure Laterality Date   ABDOMINAL AORTIC ENDOVASCULAR STENT GRAFT N/A 04/07/2017   Procedure: ENDOVASCULAR ABDOMINAL ANEURYSM REPAIR;  Surgeon: Serene Gaile ORN, MD;  Location: [facility] OR;  Service: Vascular;  Laterality: N/A;   CORONARY ANGIOPLASTY  2005   CORONARY PRESSURE/FFR WITH 3D MAPPING N/A 10/21/2023   Procedure: Coronary Pressure/FFR w/3D Mapping;  Surgeon: Mady Bruckner, MD;  Location: [facility] INVASIVE [facility];  Service: Cardiovascular;  Laterality: N/A;   LOOP RECORDER INSERTION N/A 06/07/2021   Procedure: LOOP RECORDER INSERTION;  Surgeon: Inocencio Soyla Lunger, MD;  Location: [facility];  Service: Cardiovascular;  Laterality: N/A;   RIGHT/LEFT HEART CATH AND CORONARY ANGIOGRAPHY N/A 10/21/2023   Procedure: RIGHT/LEFT HEART CATH AND CORONARY ANGIOGRAPHY;  Surgeon: Mady Bruckner, MD;  Location: [facility];  Service: Cardiovascular;  Laterality: N/A;     Allergies  Allergen Reactions   Penicillins Rash   Sulfonamide Derivatives Rash      Family History  Problem Relation Age of Onset   Heart disease Father        Heart Disease before age 73   Heart attack Father    Cancer Mother        Colon   Heart disease Mother    Hyperlipidemia Mother    Hypertension Mother    Heart attack Mother    Colon cancer Mother    Cancer Brother        lung cancer     Social History Mr. Monforte  reports that he quit smoking about 41 years ago. His smoking use included cigarettes. He started smoking about 61 years ago. He has a 30 pack-year smoking history. He has never used smokeless tobacco. Mr. Casale reports no history of alcohol use.    Physical Examination Today's Vitals   11/14/23 1423  BP: 122/78  Pulse: 78  SpO2: 95%  Weight: 147 lb (66.7  kg)  Height: 5' 10 (1.778 m)   Body mass index is 21.09 kg/m.  Gen: resting comfortably, no acute distress HEENT: no scleral icterus, pupils equal round and reactive, no palptable cervical adenopathy,  CV: RRR, no m/rg, no jvd Resp: Clear to auscultation bilaterally GI: abdomen is soft, non-tender, non-distended, normal bowel sounds, no hepatosplenomegaly MSK: extremities are warm, no edema.  Skin: warm, no rash Neuro:  no focal deficits Psych: appropriate affect   Diagnostic Studies  08/2003 cath CLINICAL HISTORY:  Mr. Snodgrass is 88 years old and had a remote PTCA of the  right coronary artery by Dr. Debby Sor and was admitted yesterday with  chest pain and minimal ST elevation in the inferior leads.  He was studied  by Dr. Elsie CHARLENA Guan and found to have a chronically totally occluded  right coronary artery which was small, and 80% to 90% stenosis near the  ostium of the circumflex artery with 70% to 80% ostial stenosis of the first  marginal Kengo Sturges.  Dr. Guan stented the proximal circumflex artery with a  TAXUS stent, feeling this was the culprit vessel.  The patient developed  some recurrent chest discomfort last night and today, and for this reason  was brought back to the laboratory for reevaluation.  His EKG showed slight  inferior ST elevation.    PROCEDURE:  The procedure was performed via the left femoral artery using  arterial sheath and a left diagnostic catheter.  Femoral arterial puncture  was performed and Omnipaque  contrast was used.  The left femoral artery was  closed with AngioSeal at the end  of the procedure.  The patient tolerated  the procedure well and left the laboratory in satisfactory condition.    RESULTS:  The stent in the proximal circumflex artery which crossed the  first marginal Stacey Sago was widely patent with no stenosis.  The first  marginal Ticia Virgo was narrowed at the ostium to 70% to 80%.  This appeared  better than on the immediate post-stenting film from yesterday.  There was  also a 30% to 40% narrowing downstream in the circumflex artery.    CONCLUSION:  Widely patent stent in the proximal circumflex artery with 80%  narrowing in a side Kathleen Likins, but with brisk TIMI-3 flow.    RECOMMENDATIONS:  The stent is widely patent and the side Nashawn Hillock has brisk  TIMI flow and the ostial stenosis appears better than the post-procedure  films.  I think it is very unlikely that the patient's symptoms are related  to the marginal Antowan Samford narrowing.  His symptoms may be related to his Plavix   and we will plan to treat him with Pepcid and continued observation.     08/2003 cath COMPLICATIONS:  None.    FINDINGS:  1. LV 114/5/13.  EF approximately 60% without regional wall motion     abnormality in an RAO projection.  2. No aortic stenosis or mitral regurgitation.  3. Left main:  Angiographically normal.  4. LAD:  Large vessel wrapping the apex.  There is a 50% stenosis of the     proximal vessel.  5. Circumflex:  Hazy, approximately 80% stenosis of the proximal vessel     involving the takeoff of moderate sized first obtuse marginal.  This     first marginal had an 80% stenosis.  The AV groove circumflex was stented     to no residual stenosis.  There remained a 70% stenosis in the obtuse     marginal.  TIMI  3 flow was maintained in both the circumflex proper and     the first obtuse marginal.  6. RCA:  Moderate sized, dominant vessel.  There is a total occlusion of the     mid vessel after the takeoff of the acute marginal.  There are bridging     collaterals and modest  left to right collaterals.  The bridging     collaterals are very suggestive of chronic total occlusion.  Films are     reviewed with Dr. Obie who agreed.    IMPRESSION/RECOMMENDATIONS:  Successful drug-eluting stent placement in the  culprit lesion of the proximal circumflex.  The patient tolerated the  procedure well.  Eptifibatide will be continued for 18 hours.  Sheaths will  be removed when the ACT is less than 175 seconds.    The patient has agreed to participate in the TRITON study comparing Plavix   with a novel thienopyridine.  He will be continued on this study drug for  approximately one year.  He, thus, should not receive any Plavix  through  that period.  Aspirin  will be continued indefinitely.   11/2019 carotid US  Summary:  Right Carotid: Velocities in the right ICA are consistent with a 1-39%  stenosis.   Left Carotid: Velocities in the left ICA are consistent with a 1-39%  stenosis.   Vertebrals:  Right vertebral artery demonstrates antegrade flow. Left  vertebral               artery demonstrates retrograde flow.  Subclavians: Left subclavian artery was stenotic. Normal flow hemodynamics  were               seen in the right subclavian artery.    05/2021 echo 1. Left ventricular ejection fraction, by estimation, is 60 to 65%. The  left ventricle has normal function. The left ventricle has no regional  wall motion abnormalities. There is mild left ventricular hypertrophy of  the basal-septal segment. Left  ventricular diastolic parameters are consistent with Grade I diastolic  dysfunction (impaired relaxation).   2. Right ventricular systolic function is normal. The right ventricular  size is normal.   3. The mitral valve is normal in structure. Mild mitral valve  regurgitation. No evidence of mitral stenosis.   4. The aortic valve is tricuspid. Aortic valve regurgitation is trivial.  No aortic stenosis is present.    08/2023 echo 1. Left ventricular ejection  fraction, by estimation, is 55 to 60%. Left  ventricular ejection fraction by 3D volume is 47 %. The left ventricle has  normal function. The left ventricle has no regional wall motion  abnormalities. There is mild left  ventricular hypertrophy. Left ventricular diastolic parameters are  consistent with Grade I diastolic dysfunction (impaired relaxation). The  average left ventricular global longitudinal strain is -14.9 %.   2. Right ventricular systolic function is normal. The right ventricular  size is normal. There is normal pulmonary artery systolic pressure.   3. Left atrial size was mildly dilated.   4. The mitral valve is normal in structure. Mild mitral valve  regurgitation. No evidence of mitral stenosis.   5. The aortic valve is tricuspid. Aortic valve regurgitation is mild. No  aortic stenosis is present.   6. The inferior vena cava is normal in size with greater than 50%  respiratory variability, suggesting right atrial pressure of 3 mmHg.   09/2023 RHC/LHC Conclusions: Significant two-vessel coronary artery disease, including chronic total occlusion of proximal RCA and 70% proximal LCx in-stent  restenosis.  There is a 50% proximal LMCA stenosis that is not hemodynamically significant (RFR = 0.93, FFR 0.89). Normal left and right heart filling pressures. Mild to moderate pulmonary hypertension (PA 50/16, mean PA 27 mmHg).   Recommendations: Continue medical therapy and secondary prevention.  Will add isosorbide mononitrate for antianginal therapy.  If the patient has refractory symptoms despite maximal tolerated doses of at least 2 antianginal medications, PCI to LCx in-stent restenosis could be considered. Maintain net even fluid balance.  Consider pulmonary consultation if dyspnea continues, given mild-moderate pulmonary hypertension. Obtain basic metabolic panel later this week to ensure stable renal function.    Assessment and Plan  1. CAD -cath as reported above.  Borderline LCX lesions. Unclear if truly the etiology of his DOE. Has not had any chest pains - ongoing symptoms after imdur added, he reports some soft bp's at home at times, would not titrate imdur further but will try increase lopressor  to 37.5mg  bid.  - CKD, high risk to consider PCI of LCX but if no other cause found and ongoing symptoms discuss risk vs benefits further at f/u.    2. HTN -bp at goal, continue current meds   3. COPD - previously followed with pulmonary - PFTs minimal obstructive but reported severe diffusion defect. 03/2017 CT PE Moderate to severe centrilobular emphysema with mild diffuse bronchial wall thickening.  - abnormal lungs, unclear if playing a role in his DOE. Needs reevaluation with pulmonary      Dorn PHEBE Ross, M.D.

## 2023-11-14 NOTE — Patient Instructions (Signed)
 Medication Instructions:   Increase Lopressor  to 37.5mg  twice a day   Continue all other medications.     Labwork:  none  Testing/Procedures:  none  Follow-Up:  3 months   Any Other Special Instructions Will Be Listed Below (If Applicable).  You have been referred to:  Pulmonary   If you need a refill on your cardiac medications before your next appointment, please call your pharmacy.

## 2023-11-15 ENCOUNTER — Other Ambulatory Visit (HOSPITAL_BASED_OUTPATIENT_CLINIC_OR_DEPARTMENT_OTHER): Payer: Self-pay

## 2023-11-18 ENCOUNTER — Encounter

## 2023-11-20 ENCOUNTER — Ambulatory Visit: Payer: Self-pay | Admitting: Cardiology

## 2023-11-22 DIAGNOSIS — I10 Essential (primary) hypertension: Secondary | ICD-10-CM | POA: Diagnosis not present

## 2023-11-22 DIAGNOSIS — J449 Chronic obstructive pulmonary disease, unspecified: Secondary | ICD-10-CM | POA: Diagnosis not present

## 2023-11-25 NOTE — Progress Notes (Signed)
 "  New Patient Pulmonology Office Visit   Subjective:  Patient ID: Justin Lyons, male    DOB: Feb 22, 1935  MRN: [id number]  Referred by: Sasser, Deward ORN, MD  CC:  Chief Complaint  Patient presents with   Consult    Shortness of breath x several months    HPI Justin Lyons is a 88 y.o. male with hx of HLD, HTN, chronic diastolic heart failure, CAD, and COPD (1.76, 70% pred) who presents for initial evaluation of dyspnea.  Discussed the use of AI scribe software for clinical note transcription with the patient, who gave verbal consent to proceed.  History of Present Illness Justin Lyons is an 88 year old male with COPD and coronary artery disease who presents with worsening shortness of breath.  He has experienced worsening shortness of breath over the past several months, particularly during exertion such as walking uphill or climbing stairs. Previously able to walk a 400-foot distance without difficulty, he now finds himself 'huffing and puffing' after doing so. No shortness of breath while sitting or lying flat. Resting alleviates his symptoms, but current inhaler medications do not provide relief.  He has a history of COPD, with a pulmonary function test in July 2025 showing a reduced FEV1/FVC ratio of 56%. He has tried various inhalers, including Albuterol  and Breztri, but they have not significantly improved his symptoms and have caused voice changes. He uses a spacer with his inhalers but still experiences hoarseness. He has not been on oxygen therapy and has not checked his oxygen levels during exertion.  He has a history of coronary artery disease, with a previous heart catheterization revealing blockages and placement of one stent. Further intervention was advised against due to kidney issues. He takes medication to help dilate his arteries, which has provided minimal relief. No history of clots in his lungs or legs.  He has a past history of smoking, having quit approximately 30  years ago after smoking for about 35 years. He denies any current exposure to allergens or irritants that exacerbate his breathing issues. He has not experienced recent episodes of bronchitis requiring antibiotics or steroids.  He has experienced weight loss over the past year, requiring a change in pant size. He denies dizziness while walking but sometimes feels the need to rest or eat to feel better. He has not participated in pulmonary rehabilitation previously.  CAT > 10, MMRC > 2, no exacerbations in the last year.  ROS  Allergies: Penicillins and Sulfonamide derivatives  Current Outpatient Medications:    albuterol  (PROVENTIL ) (2.5 MG/3ML) 0.083% nebulizer solution, Take 2.5 mg by nebulization every 6 (six) hours as needed for wheezing or shortness of breath., Disp: , Rfl:    albuterol  (VENTOLIN  HFA) 108 (90 Base) MCG/ACT inhaler, Inhale 1-2 puffs into the lungs every 6 (six) hours as needed for wheezing or shortness of breath., Disp: , Rfl:    allopurinol  (ZYLOPRIM ) 100 MG tablet, Take 100 mg by mouth daily., Disp: , Rfl:    apixaban  (ELIQUIS ) 2.5 MG TABS tablet, TAKE 1 TABLET TWICE DAILY (DISCONTINUE PLAVIX ), Disp: 180 tablet, Rfl: 1   arformoterol  (BROVANA ) 15 MCG/2ML NEBU, Take 2 mLs (15 mcg total) by nebulization 2 (two) times daily., Disp: 120 mL, Rfl: 6   Cholecalciferol  (VITAMIN D ) 1000 UNITS capsule, Take 1,000 Units by mouth daily., Disp: , Rfl:    isosorbide  mononitrate (IMDUR ) 30 MG 24 hr tablet, Take 0.5 tablets (15 mg total) by mouth daily., Disp: 30 tablet, Rfl: 5  Metoprolol  Tartrate 37.5 MG TABS, Take 1 tablet (37.5 mg total) by mouth 2 (two) times daily., Disp: 60 tablet, Rfl: 6   nitroGLYCERIN  (NITROSTAT ) 0.4 MG SL tablet, Place 1 tablet (0.4 mg total) under the tongue every 5 (five) minutes x 3 doses as needed (if no relief after 3rd dose proceed to ED or call 911)., Disp: 25 tablet, Rfl: 2   pantoprazole  (PROTONIX ) 40 MG tablet, Take 1 tablet (40 mg total) by mouth  daily for reflux., Disp: 90 tablet, Rfl: 3   revefenacin  (YUPELRI ) 175 MCG/3ML nebulizer solution, Take 3 mLs (175 mcg total) by nebulization daily., Disp: 90 mL, Rfl: 6   rosuvastatin  (CRESTOR ) 40 MG tablet, Take 1 tablet (40 mg total) by mouth at bedtime. Replacing Lipitor ., Disp: 90 tablet, Rfl: 3   allopurinol  (ZYLOPRIM ) 100 MG tablet, Take 1 tablet (100 mg total) by mouth daily. (Patient not taking: Reported on 11/26/2023), Disp: 90 tablet, Rfl: 3   cephALEXin  (KEFLEX ) 500 MG capsule, Take 1 capsule (500 mg total) by mouth 3 (three) times daily. (Patient not taking: Reported on 11/26/2023), Disp: 21 capsule, Rfl: 0   ofloxacin  (FLOXIN ) 0.3 % OTIC solution, 10 Drop(s)s in affected ear every day for 7 days (Patient not taking: Reported on 11/26/2023), Disp: 5 mL, Rfl: 0 Past Medical History:  Diagnosis Date   AAA (abdominal aortic aneurysm)    06/02/2003 old BPG   Arthritis    Asthma    Cancer (HCC)    Skin   Carotid artery occlusion    Carotid bruit    COPD (chronic obstructive pulmonary disease) (HCC)    Coronary artery disease    Diverticulosis of colon    Erectile dysfunction    Hemorrhoids, internal    History of compression fracture of spine    History of gout    Hyperlipidemia    Hypertension    Knee pain    Myocardial infarction (HCC) 01/23/2003   Peripheral vascular disease    Past Surgical History:  Procedure Laterality Date   ABDOMINAL AORTIC ENDOVASCULAR STENT GRAFT N/A 04/07/2017   Procedure: ENDOVASCULAR ABDOMINAL ANEURYSM REPAIR;  Surgeon: Serene Gaile ORN, MD;  Location: [facility] OR;  Service: Vascular;  Laterality: N/A;   CORONARY ANGIOPLASTY  2005   CORONARY PRESSURE/FFR WITH 3D MAPPING N/A 10/21/2023   Procedure: Coronary Pressure/FFR w/3D Mapping;  Surgeon: Mady Bruckner, MD;  Location: [facility] INVASIVE [facility];  Service: Cardiovascular;  Laterality: N/A;   LOOP RECORDER INSERTION N/A 06/07/2021   Procedure: LOOP RECORDER INSERTION;  Surgeon: Inocencio Soyla Lunger, MD;   Location: [facility];  Service: Cardiovascular;  Laterality: N/A;   RIGHT/LEFT HEART CATH AND CORONARY ANGIOGRAPHY N/A 10/21/2023   Procedure: RIGHT/LEFT HEART CATH AND CORONARY ANGIOGRAPHY;  Surgeon: Mady Bruckner, MD;  Location: [facility];  Service: Cardiovascular;  Laterality: N/A;   Family History  Problem Relation Age of Onset   Heart disease Father        Heart Disease before age 4   Heart attack Father    Cancer Mother        Colon   Heart disease Mother    Hyperlipidemia Mother    Hypertension Mother    Heart attack Mother    Colon cancer Mother    Cancer Brother        lung cancer   Social History   Socioeconomic History   Marital status: Single    Spouse name: Not on file   Number of children: Not on file  Years of education: Not on file   Highest education level: Not on file  Occupational History   Occupation: part time in a pharmacy    Employer: RETIRED  Tobacco Use   Smoking status: Former    Current packs/day: 0.00    Average packs/day: 1.5 packs/day for 20.0 years (30.0 ttl pk-yrs)    Types: Cigarettes    Start date: 01/22/1962    Quit date: 01/22/1982    Years since quitting: 41.8   Smokeless tobacco: Never   Tobacco comments:    Former smoker 10/18/21  Vaping Use   Vaping status: Never Used  Substance and Sexual Activity   Alcohol use: No   Drug use: No   Sexual activity: Not on file  Other Topics Concern   Not on file  Social History Narrative   Not on file   Social Drivers of Health   Financial Resource Strain: Not on file  Food Insecurity: No Food Insecurity (10/12/2021)   Hunger Vital Sign    Worried About Running Out of Food in the Last Year: Never true    Ran Out of Food in the Last Year: Never true  Transportation Needs: No Transportation Needs (10/12/2021)   PRAPARE - Administrator, Civil Service (Medical): No    Lack of Transportation (Non-Medical): No  Physical Activity: Not on file  Stress: Not on  file  Social Connections: Not on file  Intimate Partner Violence: Not on file       Objective:  BP 108/68   Pulse 65   Ht 5' 10 (1.778 m)   Wt 145 lb 5.8 oz (65.9 kg)   SpO2 93%   BMI 20.86 kg/m  Wt Readings from Last 3 Encounters:  11/26/23 145 lb 5.8 oz (65.9 kg)  11/14/23 147 lb (66.7 kg)  10/21/23 155 lb (70.3 kg)   BMI Readings from Last 3 Encounters:  11/26/23 20.86 kg/m  11/14/23 21.09 kg/m  10/21/23 22.24 kg/m   SpO2 Readings from Last 3 Encounters:  11/26/23 93%  11/14/23 95%  10/21/23 96%   Physical Exam General: NAD, alert, WD, WN Eyes: PERRL, no scleral icterus ENMT: oropharynx clear, good dentition, no oral lesions, mallampati score I Skin: warm, intact, no rashes Neck: JVD, ROM and lymph node assessment normal CV: RRR, no MRG, nl S1 and S2, no peripheral edema Resp: Lungs clear to auscultation bilaterally, no wheezes, rhonchi, or crackles, but with a tendency towards wheezing, no clubbing/cyanosis Neuro: Awake alert oriented to person place time and situation  Diagnostic Review:  Last CBC Lab Results  Component Value Date   WBC 9.2 09/09/2023   HGB 10.2 (L) 10/21/2023   HCT 30.0 (L) 10/21/2023   MCV 99.7 09/09/2023   MCH 32.7 09/09/2023   RDW 14.2 09/09/2023   PLT 135 (L) 09/09/2023   Last metabolic panel Lab Results  Component Value Date   GLUCOSE 89 09/20/2023   NA 141 10/21/2023   K 3.8 10/21/2023   CL 103 09/20/2023   CO2 23 09/20/2023   BUN 27 (H) 09/20/2023   CREATININE 2.47 (H) 09/20/2023   GFRNONAA 25 (L) 09/20/2023   CALCIUM  9.2 09/20/2023   PROT 6.8 06/05/2021   ALBUMIN 3.7 06/05/2021   BILITOT 0.9 06/05/2021   ALKPHOS 99 06/05/2021   AST 39 06/05/2021   ALT 44 06/05/2021   ANIONGAP 14 09/20/2023    PFTs: mild obstruction + severe reduction in diffusion capacity.  CTA C/A/P 2019: IMPRESSION: 1. Infrarenal 5.4 cm abdominal aortic  aneurysm, increased. Increasingly irregular outer aneurysm sac contour. New  eccentric ill-defined fluid and haziness of the para-aortic fat. These findings are indicative of an unstable aneurysm with potential imminent rupture. 2. Aortic Atherosclerosis (ICD10-I70.0) and Emphysema (ICD10-J43.9). 3. Three-vessel coronary atherosclerosis. 4. 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 5. 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 6. Apical left upper lobe 6 mm solid pulmonary nodule, new since 2005 CT. Non-contrast chest CT at 6-12 months is recommended. If the nodule is stable at time of repeat CT, then future CT at 18-24 months (from today's scan) is considered optional for low-risk patients, but is recommended for high-risk patients. This recommendation follows the consensus statement: Guidelines for Management of Incidental Pulmonary Nodules Detected on CT Images:From the Fleischner Society 2017; published online before print (10.1148/radiol.[id number]). 7. Several additional chronic findings as above.  Echo 08/2023: 1. Left ventricular ejection fraction, by estimation, is 55 to 60%. Left  ventricular ejection fraction by 3D volume is 47 %. The left ventricle has  normal function. The left ventricle has no regional wall motion  abnormalities. There is mild left  ventricular hypertrophy. Left ventricular diastolic parameters are  consistent with Grade I diastolic dysfunction (impaired relaxation). The  average left ventricular global longitudinal strain is -14.9 %.   2. Right ventricular systolic function is normal. The right ventricular  size is normal. There is normal pulmonary artery systolic pressure.   3. Left atrial size was mildly dilated.   4. The mitral valve is normal in structure. Mild mitral valve  regurgitation. No evidence of mitral stenosis.   5. The aortic valve is tricuspid. Aortic valve regurgitation is mild. No  aortic stenosis is present.   6. The inferior vena cava is normal in size with greater  than 50%  respiratory variability, suggesting right atrial pressure of 3 mmHg.     Assessment & Plan:   Assessment & Plan Centrilobular emphysema (HCC) Chronic obstructive pulmonary disease (COPD) with exertional dyspnea COPD with worsening exertional dyspnea. PFTs show obstructive lung disease with reduced FEV1/FVC ratio and significantly reduced DLCO, indicating impaired gas exchange. Shortness of breath likely exacerbated by COPD and coronary artery disease. Previous inhalers ineffective and caused voice loss. MMRC > 2, CAT > 10, no exacerbations. Grade I, Class B. Will start LAMA/LABA with Yupelri  and Brovana . - Initiated pulmonary rehabilitation at [facility]. - Prescribed nebulizer treatments with Yupelri  once daily and Brovana , twice daily. Chronic diastolic heart failure (HCC) Chronic diastolic heart failure Contributing to exertional dyspnea. Appears euvolemic on examination. Defer management to cardiology. Dyspnea on exertion Multifactorial due to CAD and COPD. Plan in the other sections. Solitary pulmonary nodule  Apical left upper lobe 6 mm solid pulmonary nodule Solitary pulmonary nodule identified in 2019. Recent weight loss raises concern for potential malignancy. - Ordered chest CT to evaluate the nodule for changes or malignancy. Need for pneumococcal 20-valent conjugate vaccination PCV 20 given. Coronary artery disease involving native heart, unspecified vessel or lesion type, unspecified whether angina present Follows with cardiology.  Orders Placed This Encounter  Procedures   CT Chest Wo Contrast   Pneumococcal conjugate vaccine 20-valent (Prevnar-20)   AMB referral to pulmonary rehabilitation   I spent 45 minutes reviewing patient's chart including prior consultant notes, imaging, and PFTs as well as face-to-face with the patient, over half in discussion of the diagnosis and the importance of compliance with the treatment plan.  Return in about 2 months (around  01/26/2024).   Jamarkis Branam, MD "

## 2023-11-26 ENCOUNTER — Encounter (HOSPITAL_BASED_OUTPATIENT_CLINIC_OR_DEPARTMENT_OTHER): Payer: Self-pay | Admitting: Pulmonary Disease

## 2023-11-26 ENCOUNTER — Ambulatory Visit (HOSPITAL_BASED_OUTPATIENT_CLINIC_OR_DEPARTMENT_OTHER): Admitting: Pulmonary Disease

## 2023-11-26 VITALS — BP 108/68 | HR 65 | Ht 70.0 in | Wt 145.4 lb

## 2023-11-26 DIAGNOSIS — I5032 Chronic diastolic (congestive) heart failure: Secondary | ICD-10-CM

## 2023-11-26 DIAGNOSIS — R0609 Other forms of dyspnea: Secondary | ICD-10-CM

## 2023-11-26 DIAGNOSIS — I2510 Atherosclerotic heart disease of native coronary artery without angina pectoris: Secondary | ICD-10-CM

## 2023-11-26 DIAGNOSIS — R911 Solitary pulmonary nodule: Secondary | ICD-10-CM

## 2023-11-26 DIAGNOSIS — Z23 Encounter for immunization: Secondary | ICD-10-CM | POA: Diagnosis not present

## 2023-11-26 DIAGNOSIS — J432 Centrilobular emphysema: Secondary | ICD-10-CM

## 2023-11-26 MED ORDER — YUPELRI 175 MCG/3ML IN SOLN: 175.0000 ug | Freq: Every day | RESPIRATORY_TRACT | 6 refills | Status: AC

## 2023-11-26 MED ORDER — ARFORMOTEROL TARTRATE 15 MCG/2ML IN NEBU: 15.0000 ug | INHALATION_SOLUTION | Freq: Two times a day (BID) | RESPIRATORY_TRACT | 6 refills | Status: AC

## 2023-11-26 NOTE — Patient Instructions (Signed)
  VISIT SUMMARY: During your visit, we discussed your worsening shortness of breath, which is likely due to your COPD and coronary artery disease. We also addressed your chronic diastolic heart failure, coronary artery disease, and a solitary pulmonary nodule. Additionally, we noted your unintentional weight loss over the past year.  YOUR PLAN: CHRONIC OBSTRUCTIVE PULMONARY DISEASE (COPD) WITH EXERTIONAL DYSPNEA: Your COPD is causing worsening shortness of breath during physical activities. -Start pulmonary rehabilitation at [facility]. -Use nebulizer treatments with Yupelri once daily  and Brovana twice daily.  CHRONIC DIASTOLIC HEART FAILURE: Your heart failure is contributing to your shortness of breath during exertion.  CORONARY ARTERY DISEASE WITH PRIOR STENT AND RESIDUAL STENOSIS: You have coronary artery disease with a prior stent and remaining blockages that were not treated due to kidney concerns. -We will monitor your kidney function for potential future intervention on the coronary artery stenosis.  SOLITARY PULMONARY NODULE, LUNG: A solitary pulmonary nodule was identified in 2019, and recent weight loss raises concern for potential malignancy. -A chest CT has been ordered to evaluate the nodule for any changes or signs of malignancy.  UNINTENTIONAL WEIGHT LOSS: You have experienced weight loss over the past year, which may be related to decreased activity or potential malignancy.  Contains text generated by [employer].

## 2023-11-27 ENCOUNTER — Encounter (HOSPITAL_COMMUNITY): Payer: Self-pay

## 2023-11-29 ENCOUNTER — Encounter (HOSPITAL_COMMUNITY)

## 2023-11-29 ENCOUNTER — Other Ambulatory Visit (HOSPITAL_BASED_OUTPATIENT_CLINIC_OR_DEPARTMENT_OTHER): Payer: Self-pay

## 2023-11-29 DIAGNOSIS — J432 Centrilobular emphysema: Secondary | ICD-10-CM | POA: Insufficient documentation

## 2023-12-02 ENCOUNTER — Encounter (HOSPITAL_COMMUNITY)

## 2023-12-02 DIAGNOSIS — D1801 Hemangioma of skin and subcutaneous tissue: Secondary | ICD-10-CM | POA: Diagnosis not present

## 2023-12-02 DIAGNOSIS — L821 Other seborrheic keratosis: Secondary | ICD-10-CM | POA: Diagnosis not present

## 2023-12-02 DIAGNOSIS — L570 Actinic keratosis: Secondary | ICD-10-CM | POA: Diagnosis not present

## 2023-12-02 DIAGNOSIS — Z85828 Personal history of other malignant neoplasm of skin: Secondary | ICD-10-CM | POA: Diagnosis not present

## 2023-12-04 ENCOUNTER — Encounter (HOSPITAL_COMMUNITY)
Admission: RE | Admit: 2023-12-04 | Discharge: 2023-12-04 | Disposition: A | Discharge status: Home / Self Care | Source: Ambulatory Visit | Attending: Pulmonary Disease | Admitting: Pulmonary Disease

## 2023-12-04 VITALS — Ht 70.0 in | Wt 143.1 lb

## 2023-12-04 DIAGNOSIS — J432 Centrilobular emphysema: Secondary | ICD-10-CM | POA: Diagnosis not present

## 2023-12-04 NOTE — Progress Notes (Addendum)
 Pulmonary Individual Treatment Plan  Patient Details  Name: Justin Lyons MRN: [id number] Date of Birth: [birth year] Referring Provider:   Flowsheet Row PULMONARY REHAB OTHER RESP ORIENTATION from 12/04/2023 in [facility] CARDIAC REHABILITATION  Referring Provider Catherine Cools MD    Initial Encounter Date:  Flowsheet Row PULMONARY REHAB OTHER RESP ORIENTATION from 12/04/2023 in [facility] CARDIAC REHABILITATION  Date 12/04/23    Visit Diagnosis: Centrilobular emphysema (HCC)  Patient's Home Medications on Admission:   Current Outpatient Medications:    albuterol  (PROVENTIL ) (2.5 MG/3ML) 0.083% nebulizer solution, Take 2.5 mg by nebulization every 6 (six) hours as needed for wheezing or shortness of breath., Disp: , Rfl:    albuterol  (VENTOLIN  HFA) 108 (90 Base) MCG/ACT inhaler, Inhale 1-2 puffs into the lungs every 6 (six) hours as needed for wheezing or shortness of breath., Disp: , Rfl:    allopurinol  (ZYLOPRIM ) 100 MG tablet, Take 100 mg by mouth daily., Disp: , Rfl:    apixaban  (ELIQUIS ) 2.5 MG TABS tablet, TAKE 1 TABLET TWICE DAILY (DISCONTINUE PLAVIX ), Disp: 180 tablet, Rfl: 1   arformoterol (BROVANA) 15 MCG/2ML NEBU, Take 2 mLs (15 mcg total) by nebulization 2 (two) times daily., Disp: 120 mL, Rfl: 6   cephALEXin  (KEFLEX ) 500 MG capsule, Take 1 capsule (500 mg total) by mouth 3 (three) times daily., Disp: 21 capsule, Rfl: 0   Cholecalciferol  (VITAMIN D ) 1000 UNITS capsule, Take 1,000 Units by mouth daily., Disp: , Rfl:    isosorbide mononitrate (IMDUR) 30 MG 24 hr tablet, Take 0.5 tablets (15 mg total) by mouth daily., Disp: 30 tablet, Rfl: 5   Metoprolol  Tartrate 37.5 MG TABS, Take 1 tablet (37.5 mg total) by mouth 2 (two) times daily., Disp: 60 tablet, Rfl: 6   nitroGLYCERIN  (NITROSTAT ) 0.4 MG SL tablet, Place 1 tablet (0.4 mg total) under the tongue every 5 (five) minutes x 3 doses as needed (if no relief after 3rd dose proceed to ED or call 911)., Disp: 25 tablet, Rfl: 2    pantoprazole  (PROTONIX ) 40 MG tablet, Take 1 tablet (40 mg total) by mouth daily for reflux., Disp: 90 tablet, Rfl: 3   revefenacin (YUPELRI) 175 MCG/3ML nebulizer solution, Take 3 mLs (175 mcg total) by nebulization daily., Disp: 90 mL, Rfl: 6   rosuvastatin  (CRESTOR ) 40 MG tablet, Take 1 tablet (40 mg total) by mouth at bedtime. Replacing Lipitor ., Disp: 90 tablet, Rfl: 3   allopurinol  (ZYLOPRIM ) 100 MG tablet, Take 1 tablet (100 mg total) by mouth daily. (Patient not taking: Reported on 11/26/2023), Disp: 90 tablet, Rfl: 3   ofloxacin (FLOXIN) 0.3 % OTIC solution, 10 Drop(s)s in affected ear every day for 7 days (Patient not taking: Reported on 12/04/2023), Disp: 5 mL, Rfl: 0  Past Medical History: Past Medical History:  Diagnosis Date   AAA (abdominal aortic aneurysm)    06/02/2003 old BPG   Arthritis    Asthma    Cancer (HCC)    Skin   Carotid artery occlusion    Carotid bruit    COPD (chronic obstructive pulmonary disease) (HCC)    Coronary artery disease    Diverticulosis of colon    Erectile dysfunction    Hemorrhoids, internal    History of compression fracture of spine    History of gout    Hyperlipidemia    Hypertension    Knee pain    Myocardial infarction (HCC) 01/23/2003   Peripheral vascular disease     Tobacco Use: Social History   Tobacco Use  Smoking Status Former   Current packs/day: 0.00   Average packs/day: 1.5 packs/day for 20.0 years (30.0 ttl pk-yrs)   Types: Cigarettes   Start date: 01/22/1962   Quit date: 01/22/1982   Years since quitting: 41.8  Smokeless Tobacco Never  Tobacco Comments   Former smoker 10/18/21    Labs: Review Flowsheet  More data exists      Latest Ref Rng & Units 09/19/2012 04/06/2017 06/05/2021 06/06/2021 10/21/2023  Labs for ITP Cardiac and Pulmonary Rehab  Cholestrol 0 - 200 mg/dL 863  - - 98  -  LDL (calc) 0 - 99 mg/dL 73  - - 57  -  HDL-C >59 mg/dL 61.09  - - 28  -  Trlycerides <150 mg/dL 878.9  - - 63  -  Hemoglobin A1c  4.8 - 5.6 % - - - 5.8  -  PH, Arterial 7.35 - 7.45 - - - - 7.397   PCO2 arterial 32 - 48 mmHg - - - - 35.8   Bicarbonate 20.0 - 28.0 mmol/L - - - - 22.0  23.3   TCO2 22 - 32 mmol/L - 24  26  - 23  25   Acid-base deficit 0.0 - 2.0 mmol/L - - - - 2.0  2.0   O2 Saturation % - - - - 94  63     Details       Multiple values from one day are sorted in reverse-chronological order         Capillary Blood Glucose: No results found for: GLUCAP   Pulmonary Assessment Scores:  UCSD: Self-administered rating of dyspnea associated with activities of daily living (ADLs) 6-point scale (0 = not at all to 5 = maximal or unable to do because of breathlessness)  Scoring Scores range from 0 to 120.  Minimally important difference is 5 units  CAT: CAT can identify the health impairment of COPD patients and is better correlated with disease progression.  CAT has a scoring range of zero to 40. The CAT score is classified into four groups of low (less than 10), medium (10 - 20), high (21-30) and very high (31-40) based on the impact level of disease on health status. A CAT score over 10 suggests significant symptoms.  A worsening CAT score could be explained by an exacerbation, poor medication adherence, poor inhaler technique, or progression of COPD or comorbid conditions.  CAT MCID is 2 points  mMRC: mMRC (Modified Medical Research Council) Dyspnea Scale is used to assess the degree of baseline functional disability in patients of respiratory disease due to dyspnea. No minimal important difference is established. A decrease in score of 1 point or greater is considered a positive change.   Pulmonary Function Assessment:   Exercise Target Goals: Exercise Program Goal: Individual exercise prescription set using results from initial 6 min walk test and THRR while considering  patient's activity barriers and safety.   Exercise Prescription Goal: Initial exercise prescription builds to 30-45  minutes a day of aerobic activity, 2-3 days per week.  Home exercise guidelines will be given to patient during program as part of exercise prescription that the participant will acknowledge.  Activity Barriers & Risk Stratification:  Activity Barriers & Cardiac Risk Stratification - 12/04/23 0855       Activity Barriers & Cardiac Risk Stratification   Activity Barriers Shortness of Breath;Back Problems   Low back pain at times.         6 Minute Walk:  6 Minute Walk  Row Name 12/04/23 1000         6 Minute Walk   Phase Initial     Distance 1300 feet     Walk Time 6 minutes     # of Rest Breaks 0     MPH 2.46     METS 2.71     RPE 12     Perceived Dyspnea  2     VO2 Peak 9.47     Symptoms Yes (comment)     Comments SOB     Resting HR 60 bpm     Resting BP 150/60     Resting Oxygen Saturation  96 %     Exercise Oxygen Saturation  during 6 min walk 90 %     Max Ex. HR 92 bpm     Max Ex. BP 170/60     2 Minute Post BP 140/60       Interval HR   1 Minute HR 66     2 Minute HR 81     3 Minute HR 85     4 Minute HR 87     5 Minute HR 90     6 Minute HR 92     2 Minute Post HR 62     Interval Heart Rate? Yes       Interval Oxygen   Interval Oxygen? Yes     Baseline Oxygen Saturation % 96 %     1 Minute Oxygen Saturation % 92 %     1 Minute Liters of Oxygen 0 L     2 Minute Oxygen Saturation % 90 %     2 Minute Liters of Oxygen 0 L     3 Minute Oxygen Saturation % 90 %     3 Minute Liters of Oxygen 0 L     4 Minute Oxygen Saturation % 91 %     4 Minute Liters of Oxygen 0 L     5 Minute Oxygen Saturation % 92 %     5 Minute Liters of Oxygen 0 L     6 Minute Oxygen Saturation % 92 %     6 Minute Liters of Oxygen 0 L     2 Minute Post Oxygen Saturation % 97 %     2 Minute Post Liters of Oxygen 0 L        Oxygen Initial Assessment:  Oxygen Initial Assessment - 12/04/23 0932       Home Oxygen   Home Oxygen Device None    Sleep Oxygen Prescription None     Home Exercise Oxygen Prescription None    Home Resting Oxygen Prescription None      Initial 6 min Walk   Oxygen Used None      Program Oxygen Prescription   Program Oxygen Prescription None      Intervention   Short Term Goals To learn and understand importance of monitoring SPO2 with pulse oximeter and demonstrate accurate use of the pulse oximeter.;To learn and understand importance of maintaining oxygen saturations>88%;To learn and demonstrate proper pursed lip breathing techniques or other breathing techniques. ;To learn and demonstrate proper use of respiratory medications    Long  Term Goals Verbalizes importance of monitoring SPO2 with pulse oximeter and return demonstration;Maintenance of O2 saturations>88%;Exhibits proper breathing techniques, such as pursed lip breathing or other method taught during program session;Compliance with respiratory medication;Demonstrates proper use of MDI's          Oxygen Re-Evaluation:  Oxygen Discharge (Final Oxygen Re-Evaluation):   Initial Exercise Prescription:  Initial Exercise Prescription - 12/04/23 1000       Date of Initial Exercise RX and Referring Provider   Date 12/04/23    Referring Provider Catherine Cools MD      Treadmill   MPH 1.6    Grade 0    Minutes 15    METs 2.23      NuStep   Level 2    SPM 50    Minutes 15    METs 1.9      Prescription Details   Frequency (times per week) 2    Duration Progress to 30 minutes of continuous aerobic without signs/symptoms of physical distress      Intensity   THRR 40-80% of Max Heartrate 90-119    Ratings of Perceived Exertion 11-13    Perceived Dyspnea 0-4      Resistance Training   Training Prescription Yes    Weight 4    Reps 10-15          Perform Capillary Blood Glucose checks as needed.  Exercise Prescription Changes:   Exercise Prescription Changes     Row Name 12/04/23 1000             Response to Exercise   Blood Pressure (Admit)  150/60       Blood Pressure (Exercise) 170/60       Blood Pressure (Exit) 140/60       Heart Rate (Admit) 60 bpm       Heart Rate (Exercise) 92 bpm       Heart Rate (Exit) 62 bpm       Oxygen Saturation (Admit) 96 %       Oxygen Saturation (Exercise) 90 %       Oxygen Saturation (Exit) 97 %       Rating of Perceived Exertion (Exercise) 12       Perceived Dyspnea (Exercise) 2          Exercise Comments:   Exercise Goals and Review:   Exercise Goals     Row Name 12/04/23 1003             Exercise Goals   Increase Physical Activity Yes       Intervention Provide advice, education, support and counseling about physical activity/exercise needs.;Develop an individualized exercise prescription for aerobic and resistive training based on initial evaluation findings, risk stratification, comorbidities and participant's personal goals.       Expected Outcomes Short Term: Attend rehab on a regular basis to increase amount of physical activity.;Long Term: Add in home exercise to make exercise part of routine and to increase amount of physical activity.;Long Term: Exercising regularly at least 3-5 days a week.       Increase Strength and Stamina Yes       Intervention Provide advice, education, support and counseling about physical activity/exercise needs.;Develop an individualized exercise prescription for aerobic and resistive training based on initial evaluation findings, risk stratification, comorbidities and participant's personal goals.       Expected Outcomes Short Term: Increase workloads from initial exercise prescription for resistance, speed, and METs.;Short Term: Perform resistance training exercises routinely during rehab and add in resistance training at home;Long Term: Improve cardiorespiratory fitness, muscular endurance and strength as measured by increased METs and functional capacity ( )       Able to understand and use rate of perceived exertion (RPE) scale Yes        Intervention  Provide education and explanation on how to use RPE scale       Expected Outcomes Short Term: Able to use RPE daily in rehab to express subjective intensity level;Long Term:  Able to use RPE to guide intensity level when exercising independently       Able to understand and use Dyspnea scale Yes       Intervention Provide education and explanation on how to use Dyspnea scale       Expected Outcomes Short Term: Able to use Dyspnea scale daily in rehab to express subjective sense of shortness of breath during exertion;Long Term: Able to use Dyspnea scale to guide intensity level when exercising independently       Knowledge and understanding of Target Heart Rate Range (THRR) Yes       Intervention Provide education and explanation of THRR including how the numbers were predicted and where they are located for reference       Expected Outcomes Short Term: Able to state/look up THRR;Long Term: Able to use THRR to govern intensity when exercising independently;Short Term: Able to use daily as guideline for intensity in rehab       Able to check pulse independently Yes       Intervention Provide education and demonstration on how to check pulse in carotid and radial arteries.;Review the importance of being able to check your own pulse for safety during independent exercise       Expected Outcomes Short Term: Able to explain why pulse checking is important during independent exercise;Long Term: Able to check pulse independently and accurately       Understanding of Exercise Prescription Yes       Intervention Provide education, explanation, and written materials on patient's individual exercise prescription       Expected Outcomes Short Term: Able to explain program exercise prescription;Long Term: Able to explain home exercise prescription to exercise independently          Exercise Goals Re-Evaluation :   Discharge Exercise Prescription (Final Exercise Prescription Changes):  Exercise  Prescription Changes - 12/04/23 1000       Response to Exercise   Blood Pressure (Admit) 150/60    Blood Pressure (Exercise) 170/60    Blood Pressure (Exit) 140/60    Heart Rate (Admit) 60 bpm    Heart Rate (Exercise) 92 bpm    Heart Rate (Exit) 62 bpm    Oxygen Saturation (Admit) 96 %    Oxygen Saturation (Exercise) 90 %    Oxygen Saturation (Exit) 97 %    Rating of Perceived Exertion (Exercise) 12    Perceived Dyspnea (Exercise) 2          Nutrition:  Target Goals: Understanding of nutrition guidelines, daily intake of sodium 1500mg , cholesterol 200mg , calories 30% from fat and 7% or less from saturated fats, daily to have 5 or more servings of fruits and vegetables.  Biometrics:  Pre Biometrics - 12/04/23 1004       Pre Biometrics   Height 5' 10 (1.778 m)    Weight 64.9 kg    Waist Circumference 33 inches    Hip Circumference 37 inches    Waist to Hip Ratio 0.89 %    BMI (Calculated) 20.53    Grip Strength 32.3 kg           Nutrition Therapy Plan and Nutrition Goals:   Nutrition Assessments:  MEDIFICTS Score Key: >=70 Need to make dietary changes  40-70 Heart Healthy Diet <= 40 Therapeutic  Level Cholesterol Diet   Picture Your Plate Scores: <59 Unhealthy dietary pattern with much room for improvement. 41-50 Dietary pattern unlikely to meet recommendations for good health and room for improvement. 51-60 More healthful dietary pattern, with some room for improvement.  >60 Healthy dietary pattern, although there may be some specific behaviors that could be improved.    Nutrition Goals Re-Evaluation:   Nutrition Goals Discharge (Final Nutrition Goals Re-Evaluation):   Psychosocial: Target Goals: Acknowledge presence or absence of significant depression and/or stress, maximize coping skills, provide positive support system. Participant is able to verbalize types and ability to use techniques and skills needed for reducing stress and  depression.  Initial Review & Psychosocial Screening:  Initial Psych Review & Screening - 12/04/23 0935       Initial Review   Current issues with None Identified      Family Dynamics   Good Support System? Yes    Comments Patient's daughter and son support him.      Barriers   Psychosocial barriers to participate in program The patient should benefit from training in stress management and relaxation.;There are no identifiable barriers or psychosocial needs.      Screening Interventions   Interventions To provide support and resources with identified psychosocial needs;Encouraged to exercise;Provide feedback about the scores to participant    Expected Outcomes Short Term goal: Utilizing psychosocial counselor, staff and physician to assist with identification of specific Stressors or current issues interfering with healing process. Setting desired goal for each stressor or current issue identified.;Long Term Goal: Stressors or current issues are controlled or eliminated.;Short Term goal: Identification and review with participant of any Quality of Life or Depression concerns found by scoring the questionnaire.;Long Term goal: The participant improves quality of Life and PHQ9 Scores as seen by post scores and/or verbalization of changes          Quality of Life Scores:  Scores of 19 and below usually indicate a poorer quality of life in these areas.  A difference of  2-3 points is a clinically meaningful difference.  A difference of 2-3 points in the total score of the Quality of Life Index has been associated with significant improvement in overall quality of life, self-image, physical symptoms, and general health in studies assessing change in quality of life.  PHQ-9: Review Flowsheet       12/04/2023 09/19/2012  Depression screen PHQ 2/9  Decreased Interest 0 0  Down, Depressed, Hopeless 0 0  PHQ - 2 Score 0 0  Altered sleeping 0 -  Tired, decreased energy 0 -  Change in  appetite 0 -  Feeling bad or failure about yourself  0 -  Trouble concentrating 0 -  Moving slowly or fidgety/restless 0 -  Suicidal thoughts 0 -  PHQ-9 Score 0 -   Interpretation of Total Score  Total Score Depression Severity:  1-4 = Minimal depression, 5-9 = Mild depression, 10-14 = Moderate depression, 15-19 = Moderately severe depression, 20-27 = Severe depression   Psychosocial Evaluation and Intervention:  Psychosocial Evaluation - 12/04/23 0935       Psychosocial Evaluation & Interventions   Interventions Stress management education;Relaxation education;Encouraged to exercise with the program and follow exercise prescription    Comments Patient was referred to pulmonary rehab with centrilobular emphysema. He denies any depression or anixety. His daughter recently moved in with him. She moved from [place]. He says he sleeps well. 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  He is being elvaluated by cardiology for the etiology of his worsning SOB with possible PCI in the future. This will be discussed with Dr. Alvan in Feb 26. He is very active doing home repairs. He has several rental properties along with his son and he does home remodeling and repairs. He also walks when he can. His goals for the program are to be able to walk further and do more activities with less SOB. He has no barriers to complete the program.    Expected Outcomes Short Term: Patient will start the program and attend consistently. Long Term: Patient will complete the program meeting personal goals.    Continue Psychosocial Services  Follow up required by staff          Psychosocial Re-Evaluation:   Psychosocial Discharge (Final Psychosocial Re-Evaluation):   Education: Education Goals: Education classes will be provided on a weekly basis, covering required topics. Participant will state understanding/return demonstration of topics presented.  Learning Barriers/Preferences:   Learning Barriers/Preferences - 12/04/23 0933       Learning Barriers/Preferences   Learning Barriers Hearing    Learning Preferences Skilled Demonstration;Written Material;Audio          Education Topics: Know Your Numbers Group instruction that is supported by a PowerPoint presentation. Instructor discusses importance of knowing and understanding resting, exercise, and post-exercise oxygen saturation, heart rate, and blood pressure. Oxygen saturation, heart rate, blood pressure, rating of perceived exertion, and dyspnea are reviewed along with a normal range for these values.    Exercise for the Pulmonary Patient Group instruction that is supported by a PowerPoint presentation. Instructor discusses benefits of exercise, core components of exercise, frequency, duration, and intensity of an exercise routine, importance of utilizing pulse oximetry during exercise, safety while exercising, and options of places to exercise outside of rehab.    MET Level  Group instruction provided by PowerPoint, verbal discussion, and written material to support subject matter. Instructor reviews what METs are and how to increase METs.    Pulmonary Medications Verbally interactive group education provided by instructor with focus on inhaled medications and proper administration.   Anatomy and Physiology of the Respiratory System Group instruction provided by PowerPoint, verbal discussion, and written material to support subject matter. Instructor reviews respiratory cycle and anatomical components of the respiratory system and their functions. Instructor also reviews differences in obstructive and restrictive respiratory diseases with examples of each.    Oxygen Safety Group instruction provided by PowerPoint, verbal discussion, and written material to support subject matter. There is an overview of "What is Oxygen" and "Why do we need it".  Instructor also reviews how to create a safe environment for  oxygen use, the importance of using oxygen as prescribed, and the risks of noncompliance. There is a brief discussion on traveling with oxygen and resources the patient may utilize.   Oxygen Use Group instruction provided by PowerPoint, verbal discussion, and written material to discuss how supplemental oxygen is prescribed and different types of oxygen supply systems. Resources for more information are provided.    Breathing Techniques Group instruction that is supported by demonstration and informational handouts. Instructor discusses the benefits of pursed lip and diaphragmatic breathing and detailed demonstration on how to perform both.     Risk Factor Reduction Group instruction that is supported by a PowerPoint presentation. Instructor discusses the definition of a risk factor, different risk factors for pulmonary disease, and how the heart and lungs work together.   Pulmonary Diseases Group instruction provided by  PowerPoint, verbal discussion, and written material to support subject matter. Instructor gives an overview of the different type of pulmonary diseases. There is also a discussion on risk factors and symptoms as well as ways to manage the diseases.   Stress and Energy Conservation Group instruction provided by PowerPoint, verbal discussion, and written material to support subject matter. Instructor gives an overview of stress and the impact it can have on the body. Instructor also reviews ways to reduce stress. There is also a discussion on energy conservation and ways to conserve energy throughout the day.   Warning Signs and Symptoms Group instruction provided by PowerPoint, verbal discussion, and written material to support subject matter. Instructor reviews warning signs and symptoms of stroke, heart attack, cold and flu. Instructor also reviews ways to prevent the spread of infection.   Other Education Group or individual verbal, written, or video instructions that  support the educational goals of the pulmonary rehab program.    Knowledge Questionnaire Score:   Core Components/Risk Factors/Patient Goals at Admission:  Personal Goals and Risk Factors at Admission - 12/04/23 0934       Core Components/Risk Factors/Patient Goals on Admission    Weight Management Weight Maintenance    Improve shortness of breath with ADL's Yes    Intervention Provide education, individualized exercise plan and daily activity instruction to help decrease symptoms of SOB with activities of daily living.    Expected Outcomes Short Term: Improve cardiorespiratory fitness to achieve a reduction of symptoms when performing ADLs;Long Term: Be able to perform more ADLs without symptoms or delay the onset of symptoms    Increase knowledge of respiratory medications and ability to use respiratory devices properly  Yes    Intervention Provide education and demonstration as needed of appropriate use of medications, inhalers, and oxygen therapy.    Expected Outcomes Short Term: Achieves understanding of medications use. Understands that oxygen is a medication prescribed by physician. Demonstrates appropriate use of inhaler and oxygen therapy.;Long Term: Maintain appropriate use of medications, inhalers, and oxygen therapy.    Hypertension Yes    Intervention Provide education on lifestyle modifcations including regular physical activity/exercise, weight management, moderate sodium restriction and increased consumption of fresh fruit, vegetables, and low fat dairy, alcohol moderation, and smoking cessation.;Monitor prescription use compliance.    Expected Outcomes Short Term: Continued assessment and intervention until BP is < 140/57mm HG in hypertensive participants. < 130/46mm HG in hypertensive participants with diabetes, heart failure or chronic kidney disease.;Long Term: Maintenance of blood pressure at goal levels.    Lipids Yes    Intervention Provide education and support for  participant on nutrition & aerobic/resistive exercise along with prescribed medications to achieve LDL 70mg , HDL >40mg .    Expected Outcomes Short Term: Participant states understanding of desired cholesterol values and is compliant with medications prescribed. Participant is following exercise prescription and nutrition guidelines.;Long Term: Cholesterol controlled with medications as prescribed, with individualized exercise RX and with personalized nutrition plan. Value goals: LDL < 70mg , HDL > 40 mg.          Core Components/Risk Factors/Patient Goals Review:    Core Components/Risk Factors/Patient Goals at Discharge (Final Review):    ITP Comments:  ITP Comments     Row Name 12/04/23 0955           ITP Comments Patient arrived for 1st visit/orientation/education at 0830. Patient was referred to PR by Dr. Paula Alghanim due to Centrilobular Emphysema. During orientation advised patient on arrival and appointment times  what to wear, what to do before, during and after exercise. Reviewed attendance and class policy.  Pt is scheduled to return Pulmonary Rehab on 12/10/23 at 10:30. Pt was advised to come to class 15 minutes before class starts.  Discussed RPE/Dpysnea scales. Patient participated in warm up stretches. Patient was able to complete 6 minute walk test.  Patient was measured for the equipment. Discussed equipment safety with patient. Took patient pre-anthropometric measurements. Patient finished visit at [phone].          Comments: Patient arrived for 1st visit/orientation/education at 0830. Patient was referred to PR by Dr. Paula Alghanim due to Centrilobular Emphysema. During orientation advised patient on arrival and appointment times what to wear, what to do before, during and after exercise. Reviewed attendance and class policy.  Pt is scheduled to return Pulmonary Rehab on 12/10/23 at 10:30. Pt was advised to come to class 15 minutes before class starts.  Discussed RPE/Dpysnea  scales. Patient participated in warm up stretches. Patient was able to complete 6 minute walk test.  Patient was measured for the equipment. Discussed equipment safety with patient. Took patient pre-anthropometric measurements. Patient finished visit at [phone].

## 2023-12-04 NOTE — Patient Instructions (Addendum)
 Patient Instructions  Patient Details  Name: Justin Lyons MRN: [id number] Date of Birth: 01-10-1936 Referring Provider:  Catherine Cools, MD  Below are your personal goals for exercise, nutrition, and risk factors. Our goal is to help you stay on track towards obtaining and maintaining these goals. We will be discussing your progress on these goals with you throughout the program.  Initial Exercise Prescription:  Initial Exercise Prescription - 12/04/23 1000       Date of Initial Exercise RX and Referring Provider   Date 12/04/23    Referring Provider Catherine Cools MD      Treadmill   MPH 1.6    Grade 0    Minutes 15    METs 2.23      NuStep   Level 2    SPM 50    Minutes 15    METs 1.9      Prescription Details   Frequency (times per week) 2    Duration Progress to 30 minutes of continuous aerobic without signs/symptoms of physical distress      Intensity   THRR 40-80% of Max Heartrate 90-119    Ratings of Perceived Exertion 11-13    Perceived Dyspnea 0-4      Resistance Training   Training Prescription Yes    Weight 4    Reps 10-15          Exercise Goals: Frequency: Be able to perform aerobic exercise two to three times per week in program working toward 2-5 days per week of home exercise.  Intensity: Work with a perceived exertion of 11 (fairly light) - 15 (hard) while following your exercise prescription.  We will make changes to your prescription with you as you progress through the program.   Duration: Be able to do 30 to 45 minutes of continuous aerobic exercise in addition to a 5 minute warm-up and a 5 minute cool-down routine.   Nutrition Goals: Your personal nutrition goals will be established when you do your nutrition analysis with the dietician.  The following are general nutrition guidelines to follow: Cholesterol < 200mg /day Sodium < 1500mg /day Fiber: Men over 50 yrs - 30 grams per day  Personal Goals:  Personal Goals and Risk Factors  at Admission - 12/04/23 0934       Core Components/Risk Factors/Patient Goals on Admission    Weight Management Weight Maintenance    Improve shortness of breath with ADL's Yes    Intervention Provide education, individualized exercise plan and daily activity instruction to help decrease symptoms of SOB with activities of daily living.    Expected Outcomes Short Term: Improve cardiorespiratory fitness to achieve a reduction of symptoms when performing ADLs;Long Term: Be able to perform more ADLs without symptoms or delay the onset of symptoms    Increase knowledge of respiratory medications and ability to use respiratory devices properly  Yes    Intervention Provide education and demonstration as needed of appropriate use of medications, inhalers, and oxygen therapy.    Expected Outcomes Short Term: Achieves understanding of medications use. Understands that oxygen is a medication prescribed by physician. Demonstrates appropriate use of inhaler and oxygen therapy.;Long Term: Maintain appropriate use of medications, inhalers, and oxygen therapy.    Hypertension Yes    Intervention Provide education on lifestyle modifcations including regular physical activity/exercise, weight management, moderate sodium restriction and increased consumption of fresh fruit, vegetables, and low fat dairy, alcohol moderation, and smoking cessation.;Monitor prescription use compliance.    Expected Outcomes Short Term: Continued  assessment and intervention until BP is < 140/29mm HG in hypertensive participants. < 130/23mm HG in hypertensive participants with diabetes, heart failure or chronic kidney disease.;Long Term: Maintenance of blood pressure at goal levels.    Lipids Yes    Intervention Provide education and support for participant on nutrition & aerobic/resistive exercise along with prescribed medications to achieve LDL 70mg , HDL >40mg .    Expected Outcomes Short Term: Participant states understanding of desired  cholesterol values and is compliant with medications prescribed. Participant is following exercise prescription and nutrition guidelines.;Long Term: Cholesterol controlled with medications as prescribed, with individualized exercise RX and with personalized nutrition plan. Value goals: LDL < 70mg , HDL > 40 mg.          Tobacco Use Initial Evaluation: Social History   Tobacco Use  Smoking Status Former   Current packs/day: 0.00   Average packs/day: 1.5 packs/day for 20.0 years (30.0 ttl pk-yrs)   Types: Cigarettes   Start date: 01/22/1962   Quit date: 01/22/1982   Years since quitting: 41.8  Smokeless Tobacco Never  Tobacco Comments   Former smoker 10/18/21    Exercise Goals and Review:  Exercise Goals     Row Name 12/04/23 1003             Exercise Goals   Increase Physical Activity Yes       Intervention Provide advice, education, support and counseling about physical activity/exercise needs.;Develop an individualized exercise prescription for aerobic and resistive training based on initial evaluation findings, risk stratification, comorbidities and participant's personal goals.       Expected Outcomes Short Term: Attend rehab on a regular basis to increase amount of physical activity.;Long Term: Add in home exercise to make exercise part of routine and to increase amount of physical activity.;Long Term: Exercising regularly at least 3-5 days a week.       Increase Strength and Stamina Yes       Intervention Provide advice, education, support and counseling about physical activity/exercise needs.;Develop an individualized exercise prescription for aerobic and resistive training based on initial evaluation findings, risk stratification, comorbidities and participant's personal goals.       Expected Outcomes Short Term: Increase workloads from initial exercise prescription for resistance, speed, and METs.;Short Term: Perform resistance training exercises routinely during rehab and add  in resistance training at home;Long Term: Improve cardiorespiratory fitness, muscular endurance and strength as measured by increased METs and functional capacity ( )       Able to understand and use rate of perceived exertion (RPE) scale Yes       Intervention Provide education and explanation on how to use RPE scale       Expected Outcomes Short Term: Able to use RPE daily in rehab to express subjective intensity level;Long Term:  Able to use RPE to guide intensity level when exercising independently       Able to understand and use Dyspnea scale Yes       Intervention Provide education and explanation on how to use Dyspnea scale       Expected Outcomes Short Term: Able to use Dyspnea scale daily in rehab to express subjective sense of shortness of breath during exertion;Long Term: Able to use Dyspnea scale to guide intensity level when exercising independently       Knowledge and understanding of Target Heart Rate Range (THRR) Yes       Intervention Provide education and explanation of THRR including how the numbers were predicted and where they are located  for reference       Expected Outcomes Short Term: Able to state/look up THRR;Long Term: Able to use THRR to govern intensity when exercising independently;Short Term: Able to use daily as guideline for intensity in rehab       Able to check pulse independently Yes       Intervention Provide education and demonstration on how to check pulse in carotid and radial arteries.;Review the importance of being able to check your own pulse for safety during independent exercise       Expected Outcomes Short Term: Able to explain why pulse checking is important during independent exercise;Long Term: Able to check pulse independently and accurately       Understanding of Exercise Prescription Yes       Intervention Provide education, explanation, and written materials on patient's individual exercise prescription       Expected Outcomes Short Term: Able  to explain program exercise prescription;Long Term: Able to explain home exercise prescription to exercise independently          Copy of goals given to participant.

## 2023-12-09 ENCOUNTER — Encounter

## 2023-12-09 ENCOUNTER — Other Ambulatory Visit (HOSPITAL_BASED_OUTPATIENT_CLINIC_OR_DEPARTMENT_OTHER): Payer: Self-pay

## 2023-12-10 ENCOUNTER — Encounter (HOSPITAL_COMMUNITY)
Admission: RE | Admit: 2023-12-10 | Discharge: 2023-12-10 | Disposition: A | Discharge status: Home / Self Care | Source: Ambulatory Visit | Attending: Pulmonary Disease | Admitting: Pulmonary Disease

## 2023-12-10 ENCOUNTER — Telehealth (HOSPITAL_COMMUNITY): Payer: Self-pay

## 2023-12-10 DIAGNOSIS — J432 Centrilobular emphysema: Secondary | ICD-10-CM

## 2023-12-10 NOTE — Progress Notes (Signed)
 Daily Session Note  Patient Details  Name: Justin Lyons MRN: [id number] Date of Birth: July 25, 1935 Referring Provider:   Flowsheet Row PULMONARY REHAB OTHER RESP ORIENTATION from 12/04/2023 in [facility] CARDIAC REHABILITATION  Referring Provider Catherine Cools MD    Encounter Date: 12/10/2023  Check In:  Session Check In - 12/10/23 1038       Check-In   Supervising physician immediately available to respond to emergencies See telemetry face sheet for immediately available MD    Location [facility]    Staff Present Powell Benders, BS, Exercise Physiologist;Brittany Jackquline, BSN, RN, WTA-C;Dashanti Burr Zina, RN    Virtual Visit No    Medication changes reported     No    Fall or balance concerns reported    No    Warm-up and Cool-down Performed on first and last piece of equipment    Resistance Training Performed Yes    VAD Patient? No    PAD/SET Patient? No      Pain Assessment   Currently in Pain? No/denies          Capillary Blood Glucose: No results found for this or any previous visit (from the past 24 hours).    Social History   Tobacco Use  Smoking Status Former   Current packs/day: 0.00   Average packs/day: 1.5 packs/day for 20.0 years (30.0 ttl pk-yrs)   Types: Cigarettes   Start date: 01/22/1962   Quit date: 01/22/1982   Years since quitting: 41.9  Smokeless Tobacco Never  Tobacco Comments   Former smoker 10/18/21    Goals Met:  Proper associated with RPD/PD & O2 Sat Independence with exercise equipment Using PLB without cueing & demonstrates good technique Exercise tolerated well No report of concerns or symptoms today Strength training completed today  Goals Unmet:  Not Applicable  Comments: First full day of exercise!  Patient was oriented to gym and equipment including functions, settings, policies, and procedures.  Patient's individual exercise prescription and treatment plan were reviewed.  All starting workloads were  established based on the results of the 6 minute walk test done at initial orientation visit.  The plan for exercise progression was also introduced and progression will be customized based on patient's performance and goals.

## 2023-12-12 ENCOUNTER — Encounter (HOSPITAL_COMMUNITY)
Admission: RE | Admit: 2023-12-12 | Discharge: 2023-12-12 | Disposition: A | Source: Ambulatory Visit | Attending: Pulmonary Disease | Admitting: Pulmonary Disease

## 2023-12-12 ENCOUNTER — Ambulatory Visit: Attending: Cardiology

## 2023-12-12 DIAGNOSIS — I2510 Atherosclerotic heart disease of native coronary artery without angina pectoris: Secondary | ICD-10-CM

## 2023-12-12 DIAGNOSIS — I639 Cerebral infarction, unspecified: Secondary | ICD-10-CM

## 2023-12-12 DIAGNOSIS — J432 Centrilobular emphysema: Secondary | ICD-10-CM

## 2023-12-12 LAB — CUP PACEART REMOTE DEVICE CHECK
Date Time Interrogation Session: 20251119231617
Implantable Pulse Generator Implant Date: 20230517

## 2023-12-12 NOTE — Progress Notes (Signed)
 Daily Session Note  Patient Details  Name: Justin Lyons MRN: [id number] Date of Birth: January 05, 1936 Referring Provider:   Flowsheet Row PULMONARY REHAB OTHER RESP ORIENTATION from 12/04/2023 in [facility] CARDIAC REHABILITATION  Referring Provider Catherine Cools MD    Encounter Date: 12/12/2023  Check In:  Session Check In - 12/12/23 1027       Check-In   Supervising physician immediately available to respond to emergencies See telemetry face sheet for immediately available MD    Location [facility]    Staff Present Powell Benders, BS, Exercise Physiologist;Jessica Vonzell, MA, RCEP, CCRP, CCET;Josiah Wojtaszek BSN, RN    Virtual Visit No    Medication changes reported     No    Fall or balance concerns reported    No    Tobacco Cessation No Change    Warm-up and Cool-down Performed on first and last piece of equipment    Resistance Training Performed Yes    VAD Patient? No    PAD/SET Patient? No      Pain Assessment   Currently in Pain? No/denies    Multiple Pain Sites No          Capillary Blood Glucose: No results found for this or any previous visit (from the past 24 hours).    Social History   Tobacco Use  Smoking Status Former   Current packs/day: 0.00   Average packs/day: 1.5 packs/day for 20.0 years (30.0 ttl pk-yrs)   Types: Cigarettes   Start date: 01/22/1962   Quit date: 01/22/1982   Years since quitting: 41.9  Smokeless Tobacco Never  Tobacco Comments   Former smoker 10/18/21    Goals Met:  Proper associated with RPD/PD & O2 Sat Independence with exercise equipment Using PLB without cueing & demonstrates good technique Exercise tolerated well Queuing for purse lip breathing No report of concerns or symptoms today Strength training completed today  Goals Unmet:  Not Applicable  Comments: SABRASABRAPt able to follow exercise prescription today without complaint.  Will continue to monitor for progression.

## 2023-12-13 ENCOUNTER — Other Ambulatory Visit (HOSPITAL_BASED_OUTPATIENT_CLINIC_OR_DEPARTMENT_OTHER): Payer: Self-pay

## 2023-12-16 ENCOUNTER — Other Ambulatory Visit (HOSPITAL_BASED_OUTPATIENT_CLINIC_OR_DEPARTMENT_OTHER): Payer: Self-pay

## 2023-12-16 NOTE — Progress Notes (Signed)
 Remote Loop Recorder Transmission

## 2023-12-17 ENCOUNTER — Encounter (HOSPITAL_COMMUNITY)
Admission: RE | Admit: 2023-12-17 | Discharge: 2023-12-17 | Disposition: A | Discharge status: Home / Self Care | Source: Ambulatory Visit | Attending: Pulmonary Disease | Admitting: Pulmonary Disease

## 2023-12-17 DIAGNOSIS — J432 Centrilobular emphysema: Secondary | ICD-10-CM

## 2023-12-17 NOTE — Progress Notes (Signed)
 Daily Session Note  Patient Details  Name: Justin Lyons MRN: [id number] Date of Birth: [birth year] Referring Provider:   Flowsheet Row PULMONARY REHAB OTHER RESP ORIENTATION from 12/04/2023 in [facility] CARDIAC REHABILITATION  Referring Provider Catherine Cools MD    Encounter Date: 12/17/2023  Check In:  Session Check In - 12/17/23 1021       Check-In   Supervising physician immediately available to respond to emergencies See telemetry face sheet for immediately available MD    Location [facility]    Staff Present Powell Benders, BS, Exercise Physiologist;Brittany Jackquline, BSN, RN, WTA-C;Elidia Bonenfant Zina, RN    Virtual Visit No    Medication changes reported     No    Fall or balance concerns reported    No    Warm-up and Cool-down Performed on first and last piece of equipment    Resistance Training Performed Yes    VAD Patient? No    PAD/SET Patient? No      Pain Assessment   Currently in Pain? No/denies          Capillary Blood Glucose: No results found for this or any previous visit (from the past 24 hours).    Social History   Tobacco Use  Smoking Status Former   Current packs/day: 0.00   Average packs/day: 1.5 packs/day for 20.0 years (30.0 ttl pk-yrs)   Types: Cigarettes   Start date: 01/22/1962   Quit date: 01/22/1982   Years since quitting: 41.9  Smokeless Tobacco Never  Tobacco Comments   Former smoker 10/18/21    Goals Met:  Proper associated with RPD/PD & O2 Sat Independence with exercise equipment Using PLB without cueing & demonstrates good technique Exercise tolerated well No report of concerns or symptoms today Strength training completed today  Goals Unmet:  Not Applicable  Comments: Pt able to follow exercise prescription today without complaint.  Will continue to monitor for progression.

## 2023-12-18 ENCOUNTER — Ambulatory Visit: Payer: Self-pay | Admitting: Cardiology

## 2023-12-19 ENCOUNTER — Encounter

## 2023-12-23 ENCOUNTER — Other Ambulatory Visit (HOSPITAL_BASED_OUTPATIENT_CLINIC_OR_DEPARTMENT_OTHER): Payer: Self-pay

## 2023-12-24 ENCOUNTER — Encounter (HOSPITAL_COMMUNITY)

## 2023-12-24 ENCOUNTER — Encounter (HOSPITAL_COMMUNITY): Payer: Self-pay | Admitting: *Deleted

## 2023-12-24 DIAGNOSIS — J432 Centrilobular emphysema: Secondary | ICD-10-CM

## 2023-12-24 NOTE — Progress Notes (Signed)
 Pulmonary Individual Treatment Plan  Patient Details  Name: Justin Lyons MRN: [id number] Date of Birth: [birth year] Referring Provider:   Flowsheet Row PULMONARY REHAB OTHER RESP ORIENTATION from 12/04/2023 in [facility] CARDIAC REHABILITATION  Referring Provider Catherine Cools MD    Initial Encounter Date:  Flowsheet Row PULMONARY REHAB OTHER RESP ORIENTATION from 12/04/2023 in [facility]  Date 12/04/23    Visit Diagnosis: Centrilobular emphysema (HCC)  Patient's Home Medications on Admission:   Current Outpatient Medications:    albuterol  (PROVENTIL ) (2.5 MG/3ML) 0.083% nebulizer solution, Take 2.5 mg by nebulization every 6 (six) hours as needed for wheezing or shortness of breath., Disp: , Rfl:    albuterol  (VENTOLIN  HFA) 108 (90 Base) MCG/ACT inhaler, Inhale 1-2 puffs into the lungs every 6 (six) hours as needed for wheezing or shortness of breath., Disp: , Rfl:    allopurinol  (ZYLOPRIM ) 100 MG tablet, Take 100 mg by mouth daily., Disp: , Rfl:    allopurinol  (ZYLOPRIM ) 100 MG tablet, Take 1 tablet (100 mg total) by mouth daily. (Patient not taking: Reported on 11/26/2023), Disp: 90 tablet, Rfl: 3   apixaban  (ELIQUIS ) 2.5 MG TABS tablet, TAKE 1 TABLET TWICE DAILY (DISCONTINUE PLAVIX ), Disp: 180 tablet, Rfl: 1   arformoterol  (BROVANA ) 15 MCG/2ML NEBU, Take 2 mLs (15 mcg total) by nebulization 2 (two) times daily., Disp: 120 mL, Rfl: 6   cephALEXin  (KEFLEX ) 500 MG capsule, Take 1 capsule (500 mg total) by mouth 3 (three) times daily., Disp: 21 capsule, Rfl: 0   Cholecalciferol  (VITAMIN D ) 1000 UNITS capsule, Take 1,000 Units by mouth daily., Disp: , Rfl:    isosorbide  mononitrate (IMDUR ) 30 MG 24 hr tablet, Take 0.5 tablets (15 mg total) by mouth daily., Disp: 30 tablet, Rfl: 5   Metoprolol  Tartrate 37.5 MG TABS, Take 1 tablet (37.5 mg total) by mouth 2 (two) times daily., Disp: 60 tablet, Rfl: 6   nitroGLYCERIN  (NITROSTAT ) 0.4 MG SL tablet, Place 1 tablet (0.4  mg total) under the tongue every 5 (five) minutes x 3 doses as needed (if no relief after 3rd dose proceed to ED or call 911)., Disp: 25 tablet, Rfl: 2   ofloxacin  (FLOXIN ) 0.3 % OTIC solution, 10 Drop(s)s in affected ear every day for 7 days (Patient not taking: Reported on 12/04/2023), Disp: 5 mL, Rfl: 0   pantoprazole  (PROTONIX ) 40 MG tablet, Take 1 tablet (40 mg total) by mouth daily for reflux., Disp: 90 tablet, Rfl: 3   revefenacin  (YUPELRI ) 175 MCG/3ML nebulizer solution, Take 3 mLs (175 mcg total) by nebulization daily., Disp: 90 mL, Rfl: 6   rosuvastatin  (CRESTOR ) 40 MG tablet, Take 1 tablet (40 mg total) by mouth at bedtime. Replacing Lipitor ., Disp: 90 tablet, Rfl: 3  Past Medical History: Past Medical History:  Diagnosis Date   AAA (abdominal aortic aneurysm)    06/02/2003 old BPG   Arthritis    Asthma    Cancer (HCC)    Skin   Carotid artery occlusion    Carotid bruit    COPD (chronic obstructive pulmonary disease) (HCC)    Coronary artery disease    Diverticulosis of colon    Erectile dysfunction    Hemorrhoids, internal    History of compression fracture of spine    History of gout    Hyperlipidemia    Hypertension    Knee pain    Myocardial infarction (HCC) 01/23/2003   Peripheral vascular disease     Tobacco Use: Social History   Tobacco Use  Smoking Status Former   Current packs/day: 0.00   Average packs/day: 1.5 packs/day for 20.0 years (30.0 ttl pk-yrs)   Types: Cigarettes   Start date: 01/22/1962   Quit date: 01/22/1982   Years since quitting: 41.9  Smokeless Tobacco Never  Tobacco Comments   Former smoker 10/18/21    Labs: Review Flowsheet  More data exists      Latest Ref Rng & Units 09/19/2012 04/06/2017 06/05/2021 06/06/2021 10/21/2023  Labs for ITP Cardiac and Pulmonary Rehab  Cholestrol 0 - 200 mg/dL 863  - - 98  -  LDL (calc) 0 - 99 mg/dL 73  - - 57  -  HDL-C >59 mg/dL 61.09  - - 28  -  Trlycerides <150 mg/dL 878.9  - - 63  -  Hemoglobin A1c  4.8 - 5.6 % - - - 5.8  -  PH, Arterial 7.35 - 7.45 - - - - 7.397   PCO2 arterial 32 - 48 mmHg - - - - 35.8   Bicarbonate 20.0 - 28.0 mmol/L - - - - 22.0  23.3   TCO2 22 - 32 mmol/L - 24  26  - 23  25   Acid-base deficit 0.0 - 2.0 mmol/L - - - - 2.0  2.0   O2 Saturation % - - - - 94  63     Details       Multiple values from one day are sorted in reverse-chronological order         Capillary Blood Glucose: No results found for: GLUCAP   Pulmonary Assessment Scores:  UCSD: Self-administered rating of dyspnea associated with activities of daily living (ADLs) 6-point scale (0 = not at all to 5 = maximal or unable to do because of breathlessness)  Scoring Scores range from 0 to 120.  Minimally important difference is 5 units  CAT: CAT can identify the health impairment of COPD patients and is better correlated with disease progression.  CAT has a scoring range of zero to 40. The CAT score is classified into four groups of low (less than 10), medium (10 - 20), high (21-30) and very high (31-40) based on the impact level of disease on health status. A CAT score over 10 suggests significant symptoms.  A worsening CAT score could be explained by an exacerbation, poor medication adherence, poor inhaler technique, or progression of COPD or comorbid conditions.  CAT MCID is 2 points  mMRC: mMRC (Modified Medical Research Council) Dyspnea Scale is used to assess the degree of baseline functional disability in patients of respiratory disease due to dyspnea. No minimal important difference is established. A decrease in score of 1 point or greater is considered a positive change.   Pulmonary Function Assessment:   Exercise Target Goals: Exercise Program Goal: Individual exercise prescription set using results from initial 6 min walk test and THRR while considering  patient's activity barriers and safety.   Exercise Prescription Goal: Initial exercise prescription builds to 30-45  minutes a day of aerobic activity, 2-3 days per week.  Home exercise guidelines will be given to patient during program as part of exercise prescription that the participant will acknowledge.  Activity Barriers & Risk Stratification:  Activity Barriers & Cardiac Risk Stratification - 12/04/23 0855       Activity Barriers & Cardiac Risk Stratification   Activity Barriers Shortness of Breath;Back Problems   Low back pain at times.         6 Minute Walk:  6 Minute Walk  Row Name 12/04/23 1000         6 Minute Walk   Phase Initial     Distance 1300 feet     Walk Time 6 minutes     # of Rest Breaks 0     MPH 2.46     METS 2.71     RPE 12     Perceived Dyspnea  2     VO2 Peak 9.47     Symptoms Yes (comment)     Comments SOB     Resting HR 60 bpm     Resting BP 150/60     Resting Oxygen Saturation  96 %     Exercise Oxygen Saturation  during 6 min walk 90 %     Max Ex. HR 92 bpm     Max Ex. BP 170/60     2 Minute Post BP 140/60       Interval HR   1 Minute HR 66     2 Minute HR 81     3 Minute HR 85     4 Minute HR 87     5 Minute HR 90     6 Minute HR 92     2 Minute Post HR 62     Interval Heart Rate? Yes       Interval Oxygen   Interval Oxygen? Yes     Baseline Oxygen Saturation % 96 %     1 Minute Oxygen Saturation % 92 %     1 Minute Liters of Oxygen 0 L     2 Minute Oxygen Saturation % 90 %     2 Minute Liters of Oxygen 0 L     3 Minute Oxygen Saturation % 90 %     3 Minute Liters of Oxygen 0 L     4 Minute Oxygen Saturation % 91 %     4 Minute Liters of Oxygen 0 L     5 Minute Oxygen Saturation % 92 %     5 Minute Liters of Oxygen 0 L     6 Minute Oxygen Saturation % 92 %     6 Minute Liters of Oxygen 0 L     2 Minute Post Oxygen Saturation % 97 %     2 Minute Post Liters of Oxygen 0 L        Oxygen Initial Assessment:  Oxygen Initial Assessment - 12/04/23 0932       Home Oxygen   Home Oxygen Device None    Sleep Oxygen Prescription None     Home Exercise Oxygen Prescription None    Home Resting Oxygen Prescription None      Initial 6 min Walk   Oxygen Used None      Program Oxygen Prescription   Program Oxygen Prescription None      Intervention   Short Term Goals To learn and understand importance of monitoring SPO2 with pulse oximeter and demonstrate accurate use of the pulse oximeter.;To learn and understand importance of maintaining oxygen saturations>88%;To learn and demonstrate proper pursed lip breathing techniques or other breathing techniques. ;To learn and demonstrate proper use of respiratory medications    Long  Term Goals Verbalizes importance of monitoring SPO2 with pulse oximeter and return demonstration;Maintenance of O2 saturations>88%;Exhibits proper breathing techniques, such as pursed lip breathing or other method taught during program session;Compliance with respiratory medication;Demonstrates proper use of MDI's          Oxygen Re-Evaluation:  Oxygen Discharge (Final Oxygen Re-Evaluation):   Initial Exercise Prescription:  Initial Exercise Prescription - 12/04/23 1000       Date of Initial Exercise RX and Referring Provider   Date 12/04/23    Referring Provider Catherine Cools MD      Treadmill   MPH 1.6    Grade 0    Minutes 15    METs 2.23      NuStep   Level 2    SPM 50    Minutes 15    METs 1.9      Prescription Details   Frequency (times per week) 2    Duration Progress to 30 minutes of continuous aerobic without signs/symptoms of physical distress      Intensity   THRR 40-80% of Max Heartrate 90-119    Ratings of Perceived Exertion 11-13    Perceived Dyspnea 0-4      Resistance Training   Training Prescription Yes    Weight 4    Reps 10-15          Perform Capillary Blood Glucose checks as needed.  Exercise Prescription Changes:   Exercise Prescription Changes     Row Name 12/04/23 1000             Response to Exercise   Blood Pressure (Admit)  150/60       Blood Pressure (Exercise) 170/60       Blood Pressure (Exit) 140/60       Heart Rate (Admit) 60 bpm       Heart Rate (Exercise) 92 bpm       Heart Rate (Exit) 62 bpm       Oxygen Saturation (Admit) 96 %       Oxygen Saturation (Exercise) 90 %       Oxygen Saturation (Exit) 97 %       Rating of Perceived Exertion (Exercise) 12       Perceived Dyspnea (Exercise) 2          Exercise Comments:   Exercise Comments     Row Name 12/10/23 1039           Exercise Comments First full day of exercise!  Patient was oriented to gym and equipment including functions, settings, policies, and procedures.  Patient's individual exercise prescription and treatment plan were reviewed.  All starting workloads were established based on the results of the 6 minute walk test done at initial orientation visit.  The plan for exercise progression was also introduced and progression will be customized based on patient's performance and goals.          Exercise Goals and Review:   Exercise Goals     Row Name 12/04/23 1003             Exercise Goals   Increase Physical Activity Yes       Intervention Provide advice, education, support and counseling about physical activity/exercise needs.;Develop an individualized exercise prescription for aerobic and resistive training based on initial evaluation findings, risk stratification, comorbidities and participant's personal goals.       Expected Outcomes Short Term: Attend rehab on a regular basis to increase amount of physical activity.;Long Term: Add in home exercise to make exercise part of routine and to increase amount of physical activity.;Long Term: Exercising regularly at least 3-5 days a week.       Increase Strength and Stamina Yes       Intervention Provide advice, education, support and counseling about physical activity/exercise  needs.;Develop an individualized exercise prescription for aerobic and resistive training based on initial  evaluation findings, risk stratification, comorbidities and participant's personal goals.       Expected Outcomes Short Term: Increase workloads from initial exercise prescription for resistance, speed, and METs.;Short Term: Perform resistance training exercises routinely during rehab and add in resistance training at home;Long Term: Improve cardiorespiratory fitness, muscular endurance and strength as measured by increased METs and functional capacity ( )       Able to understand and use rate of perceived exertion (RPE) scale Yes       Intervention Provide education and explanation on how to use RPE scale       Expected Outcomes Short Term: Able to use RPE daily in rehab to express subjective intensity level;Long Term:  Able to use RPE to guide intensity level when exercising independently       Able to understand and use Dyspnea scale Yes       Intervention Provide education and explanation on how to use Dyspnea scale       Expected Outcomes Short Term: Able to use Dyspnea scale daily in rehab to express subjective sense of shortness of breath during exertion;Long Term: Able to use Dyspnea scale to guide intensity level when exercising independently       Knowledge and understanding of Target Heart Rate Range (THRR) Yes       Intervention Provide education and explanation of THRR including how the numbers were predicted and where they are located for reference       Expected Outcomes Short Term: Able to state/look up THRR;Long Term: Able to use THRR to govern intensity when exercising independently;Short Term: Able to use daily as guideline for intensity in rehab       Able to check pulse independently Yes       Intervention Provide education and demonstration on how to check pulse in carotid and radial arteries.;Review the importance of being able to check your own pulse for safety during independent exercise       Expected Outcomes Short Term: Able to explain why pulse checking is important  during independent exercise;Long Term: Able to check pulse independently and accurately       Understanding of Exercise Prescription Yes       Intervention Provide education, explanation, and written materials on patient's individual exercise prescription       Expected Outcomes Short Term: Able to explain program exercise prescription;Long Term: Able to explain home exercise prescription to exercise independently          Exercise Goals Re-Evaluation :   Discharge Exercise Prescription (Final Exercise Prescription Changes):  Exercise Prescription Changes - 12/04/23 1000       Response to Exercise   Blood Pressure (Admit) 150/60    Blood Pressure (Exercise) 170/60    Blood Pressure (Exit) 140/60    Heart Rate (Admit) 60 bpm    Heart Rate (Exercise) 92 bpm    Heart Rate (Exit) 62 bpm    Oxygen Saturation (Admit) 96 %    Oxygen Saturation (Exercise) 90 %    Oxygen Saturation (Exit) 97 %    Rating of Perceived Exertion (Exercise) 12    Perceived Dyspnea (Exercise) 2          Nutrition:  Target Goals: Understanding of nutrition guidelines, daily intake of sodium 1500mg , cholesterol 200mg , calories 30% from fat and 7% or less from saturated fats, daily to have 5 or more servings of fruits and vegetables.  Biometrics:  Pre Biometrics - 12/04/23 1004       Pre Biometrics   Height 5' 10 (1.778 m)    Weight 143 lb 1.3 oz (64.9 kg)    Waist Circumference 33 inches    Hip Circumference 37 inches    Waist to Hip Ratio 0.89 %    BMI (Calculated) 20.53    Grip Strength 32.3 kg           Nutrition Therapy Plan and Nutrition Goals:   Nutrition Assessments:  MEDIFICTS Score Key: >=70 Need to make dietary changes  40-70 Heart Healthy Diet <= 40 Therapeutic Level Cholesterol Diet   Picture Your Plate Scores: <59 Unhealthy dietary pattern with much room for improvement. 41-50 Dietary pattern unlikely to meet recommendations for good health and room for  improvement. 51-60 More healthful dietary pattern, with some room for improvement.  >60 Healthy dietary pattern, although there may be some specific behaviors that could be improved.    Nutrition Goals Re-Evaluation:   Nutrition Goals Discharge (Final Nutrition Goals Re-Evaluation):   Psychosocial: Target Goals: Acknowledge presence or absence of significant depression and/or stress, maximize coping skills, provide positive support system. Participant is able to verbalize types and ability to use techniques and skills needed for reducing stress and depression.  Initial Review & Psychosocial Screening:  Initial Psych Review & Screening - 12/04/23 0935       Initial Review   Current issues with None Identified      Family Dynamics   Good Support System? Yes    Comments Patient's daughter and son support him.      Barriers   Psychosocial barriers to participate in program The patient should benefit from training in stress management and relaxation.;There are no identifiable barriers or psychosocial needs.      Screening Interventions   Interventions To provide support and resources with identified psychosocial needs;Encouraged to exercise;Provide feedback about the scores to participant    Expected Outcomes Short Term goal: Utilizing psychosocial counselor, staff and physician to assist with identification of specific Stressors or current issues interfering with healing process. Setting desired goal for each stressor or current issue identified.;Long Term Goal: Stressors or current issues are controlled or eliminated.;Short Term goal: Identification and review with participant of any Quality of Life or Depression concerns found by scoring the questionnaire.;Long Term goal: The participant improves quality of Life and PHQ9 Scores as seen by post scores and/or verbalization of changes          Quality of Life Scores:  Scores of 19 and below usually indicate a poorer quality of life  in these areas.  A difference of  2-3 points is a clinically meaningful difference.  A difference of 2-3 points in the total score of the Quality of Life Index has been associated with significant improvement in overall quality of life, self-image, physical symptoms, and general health in studies assessing change in quality of life.   PHQ-9: Review Flowsheet       12/04/2023 09/19/2012  Depression screen PHQ 2/9  Decreased Interest 0 0  Down, Depressed, Hopeless 0 0  PHQ - 2 Score 0 0  Altered sleeping 0 -  Tired, decreased energy 0 -  Change in appetite 0 -  Feeling bad or failure about yourself  0 -  Trouble concentrating 0 -  Moving slowly or fidgety/restless 0 -  Suicidal thoughts 0 -  PHQ-9 Score 0 -   Interpretation of Total Score  Total Score Depression Severity:  1-4 =  Minimal depression, 5-9 = Mild depression, 10-14 = Moderate depression, 15-19 = Moderately severe depression, 20-27 = Severe depression   Psychosocial Evaluation and Intervention:  Psychosocial Evaluation - 12/04/23 0935       Psychosocial Evaluation & Interventions   Interventions Stress management education;Relaxation education;Encouraged to exercise with the program and follow exercise prescription    Comments Patient was referred to pulmonary rehab with centrilobular emphysema. He denies any depression or anixety. His daughter recently moved in with him. She moved from [place]. He says he sleeps well. 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 He is being elvaluated by cardiology for the etiology of his worsning SOB with possible PCI in the future. This will be discussed with Dr. Alvan in Feb 26. He is very active doing home repairs. He has several rental properties along with his son and he does home remodeling and repairs. He also walks when he can. His goals for the program are to be able to walk further and do more activities with less SOB. He has no barriers to complete the  program.    Expected Outcomes Short Term: Patient will start the program and attend consistently. Long Term: Patient will complete the program meeting personal goals.    Continue Psychosocial Services  Follow up required by staff          Psychosocial Re-Evaluation:   Psychosocial Discharge (Final Psychosocial Re-Evaluation):    Education: Education Goals: Education classes will be provided on a weekly basis, covering required topics. Participant will state understanding/return demonstration of topics presented.  Learning Barriers/Preferences:  Learning Barriers/Preferences - 12/04/23 0933       Learning Barriers/Preferences   Learning Barriers Hearing    Learning Preferences Skilled Demonstration;Written Material;Audio          Education Topics: How Lungs Work and Diseases: - Discuss the anatomy of the lungs and diseases that can affect the lungs, such as COPD.   Exercise: -Discuss the importance of exercise, FITT principles of exercise, normal and abnormal responses to exercise, and how to exercise safely.   Environmental Irritants: -Discuss types of environmental irritants and how to limit exposure to environmental irritants.   Meds/Inhalers and oxygen: - Discuss respiratory medications, definition of an inhaler and oxygen, and the proper way to use an inhaler and oxygen.   Energy Saving Techniques: - Discuss methods to conserve energy and decrease shortness of breath when performing activities of daily living.    Bronchial Hygiene / Breathing Techniques: - Discuss breathing mechanics, pursed-lip breathing technique,  proper posture, effective ways to clear airways, and other functional breathing techniques   Cleaning Equipment: - Provides group verbal and written instruction about the health risks of elevated stress, cause of high stress, and healthy ways to reduce stress.   Nutrition I: Fats: - Discuss the types of cholesterol, what cholesterol does to  the body, and how cholesterol levels can be controlled.   Nutrition II: Labels: -Discuss the different components of food labels and how to read food labels.   Respiratory Infections: - Discuss the signs and symptoms of respiratory infections, ways to prevent respiratory infections, and the importance of seeking medical treatment when having a respiratory infection.   Stress I: Signs and Symptoms: - Discuss the causes of stress, how stress may lead to anxiety and depression, and ways to limit stress.   Stress II: Relaxation: -Discuss relaxation techniques to limit stress.   Oxygen for Home/Travel: - Discuss how to prepare for travel  when on oxygen and proper ways to transport and store oxygen to ensure safety.   Knowledge Questionnaire Score:   Core Components/Risk Factors/Patient Goals at Admission:  Personal Goals and Risk Factors at Admission - 12/04/23 0934       Core Components/Risk Factors/Patient Goals on Admission    Weight Management Weight Maintenance    Improve shortness of breath with ADL's Yes    Intervention Provide education, individualized exercise plan and daily activity instruction to help decrease symptoms of SOB with activities of daily living.    Expected Outcomes Short Term: Improve cardiorespiratory fitness to achieve a reduction of symptoms when performing ADLs;Long Term: Be able to perform more ADLs without symptoms or delay the onset of symptoms    Increase knowledge of respiratory medications and ability to use respiratory devices properly  Yes    Intervention Provide education and demonstration as needed of appropriate use of medications, inhalers, and oxygen therapy.    Expected Outcomes Short Term: Achieves understanding of medications use. Understands that oxygen is a medication prescribed by physician. Demonstrates appropriate use of inhaler and oxygen therapy.;Long Term: Maintain appropriate use of medications, inhalers, and oxygen therapy.     Hypertension Yes    Intervention Provide education on lifestyle modifcations including regular physical activity/exercise, weight management, moderate sodium restriction and increased consumption of fresh fruit, vegetables, and low fat dairy, alcohol moderation, and smoking cessation.;Monitor prescription use compliance.    Expected Outcomes Short Term: Continued assessment and intervention until BP is < 140/83mm HG in hypertensive participants. < 130/61mm HG in hypertensive participants with diabetes, heart failure or chronic kidney disease.;Long Term: Maintenance of blood pressure at goal levels.    Lipids Yes    Intervention Provide education and support for participant on nutrition & aerobic/resistive exercise along with prescribed medications to achieve LDL 70mg , HDL >40mg .    Expected Outcomes Short Term: Participant states understanding of desired cholesterol values and is compliant with medications prescribed. Participant is following exercise prescription and nutrition guidelines.;Long Term: Cholesterol controlled with medications as prescribed, with individualized exercise RX and with personalized nutrition plan. Value goals: LDL < 70mg , HDL > 40 mg.          Core Components/Risk Factors/Patient Goals Review:    Core Components/Risk Factors/Patient Goals at Discharge (Final Review):    ITP Comments:  ITP Comments     Row Name 12/04/23 0955 12/10/23 1039 12/24/23 1054       ITP Comments Patient arrived for 1st visit/orientation/education at 0830. Patient was referred to PR by Dr. Paula Alghanim due to Centrilobular Emphysema. During orientation advised patient on arrival and appointment times what to wear, what to do before, during and after exercise. Reviewed attendance and class policy.  Pt is scheduled to return Pulmonary Rehab on 12/10/23 at 10:30. Pt was advised to come to class 15 minutes before class starts.  Discussed RPE/Dpysnea scales. Patient participated in warm up  stretches. Patient was able to complete 6 minute walk test.  Patient was measured for the equipment. Discussed equipment safety with patient. Took patient pre-anthropometric measurements. Patient finished visit at [phone]. First full day of exercise!  Patient was oriented to gym and equipment including functions, settings, policies, and procedures.  Patient's individual exercise prescription and treatment plan were reviewed.  All starting workloads were established based on the results of the 6 minute walk test done at initial orientation visit.  The plan for exercise progression was also introduced and progression will be customized based on patient's performance and  goals. 30 day review completed. ITP sent to Dr.Jehanzeb Memon, Medical Director of  Pulmonary Rehab. Continue with ITP unless changes are made by physician.    Pt's wife left message at end of day yesterday that he has some other stuff going on and would not be returning.  We will call to follow up.        Comments: 30 day review

## 2023-12-25 ENCOUNTER — Telehealth: Payer: Self-pay | Admitting: Pulmonary Disease

## 2023-12-25 DIAGNOSIS — J432 Centrilobular emphysema: Secondary | ICD-10-CM

## 2023-12-25 NOTE — Telephone Encounter (Unsigned)
 Copied from CRM #[id number]. Topic: Referral - Request for Referral >> Dec 23, 2023  3:37 PM Russell PARAS wrote: Did the patient discuss referral with their provider in the last year? Yes (If No - schedule appointment) (If Yes - send message)  Appointment offered? No, referral is being requested for [facility], which is he has already been referred for. He is currently getting pulm rehab at [facility] but family does not believe he is getting his needs met at the facility.   Type of order/referral and detailed reason for visit: Pulmonary Rehabilitation  Preference of office, provider, location: [facility] in [place], [place]- FX# [phone]  If referral order, have you been seen by this specialty before? Yes (If Yes, this issue or another issue? When? Where?  Can we respond through MyChart? Yes >> Dec 25, 2023  2:47 PM Leila BROCKS wrote: Patient's child [name] [phone] is checking on status of referral pulmonary rehab, called on Monday. Per CAL, referral can take 7-14 days and send crm. [name] states has the referral for [facility], can't the order just be sent to a different pulmonary rehab as requested. Please advise and call back.

## 2023-12-26 ENCOUNTER — Encounter (HOSPITAL_COMMUNITY)

## 2023-12-26 NOTE — Telephone Encounter (Signed)
 I'm fine with sending referral to another location.

## 2023-12-26 NOTE — Telephone Encounter (Signed)
 Please advise is Pulm rehab location change is appropriate

## 2023-12-26 NOTE — Telephone Encounter (Signed)
 Referral order placed.

## 2023-12-31 ENCOUNTER — Encounter (HOSPITAL_COMMUNITY)

## 2023-12-31 ENCOUNTER — Other Ambulatory Visit (HOSPITAL_BASED_OUTPATIENT_CLINIC_OR_DEPARTMENT_OTHER): Payer: Self-pay

## 2023-12-31 ENCOUNTER — Other Ambulatory Visit: Payer: Self-pay

## 2023-12-31 MED ORDER — PANTOPRAZOLE SODIUM 40 MG PO TBEC
40.0000 mg | DELAYED_RELEASE_TABLET | Freq: Every day | ORAL | 3 refills | Status: AC
  Filled 2023-12-31: qty 90, 90d supply, fill #0

## 2024-01-01 ENCOUNTER — Ambulatory Visit (HOSPITAL_COMMUNITY)
Admission: RE | Admit: 2024-01-01 | Discharge: 2024-01-01 | Disposition: A | Discharge status: Home / Self Care | Source: Ambulatory Visit | Attending: Pulmonary Disease | Admitting: Pulmonary Disease

## 2024-01-01 DIAGNOSIS — J432 Centrilobular emphysema: Secondary | ICD-10-CM | POA: Diagnosis present

## 2024-01-01 DIAGNOSIS — R911 Solitary pulmonary nodule: Secondary | ICD-10-CM | POA: Insufficient documentation

## 2024-01-02 ENCOUNTER — Other Ambulatory Visit (HOSPITAL_BASED_OUTPATIENT_CLINIC_OR_DEPARTMENT_OTHER): Payer: Self-pay

## 2024-01-02 ENCOUNTER — Encounter (HOSPITAL_COMMUNITY)

## 2024-01-02 ENCOUNTER — Encounter (HOSPITAL_COMMUNITY): Payer: Self-pay

## 2024-01-02 DIAGNOSIS — J432 Centrilobular emphysema: Secondary | ICD-10-CM

## 2024-01-02 NOTE — Progress Notes (Signed)
 [name] daughter called to have [name] discharged from Pulmonary Rehab. Stated that they wanted to be closer to home. Discharging [name]

## 2024-01-02 NOTE — Progress Notes (Signed)
 Pulmonary Individual Treatment Plan  Patient Details  Name: Justin Lyons MRN: [id number] Date of Birth: 02-22-1935 Referring Provider:   Flowsheet Row PULMONARY REHAB OTHER RESP ORIENTATION from 12/04/2023 in [facility]ARDI[facility] REH[facility]  Referring Provider Catherine Cools MD    Initial Encounter Date:  Flowsheet Row PULMONARY REHAB OTHER RESP ORIENTATION from 12/04/2023 in [facility] [place] [facility]  Date 12/04/23    Visit Diagnosis: Centrilobular emphysema (HCC)  Patient's Home Medications on Admission: Current Medications[1]  Past Medical History: Past Medical History:  Diagnosis Date   AAA (abdominal aortic aneurysm)    06/02/2003 old BPG   Arthritis    Asthma    Cancer (HCC)    Skin   Carotid artery occlusion    Carotid bruit    COPD (chronic obstructive pulmonary disease) (HCC)    Coronary artery disease    Diverticulosis of colon    Erectile dysfunction    Hemorrhoids, internal    History of compression fracture of spine    History of gout    Hyperlipidemia    Hypertension    Knee pain    Myocardial infarction (HCC) 01/23/2003   Peripheral vascular disease     Tobacco Use: Tobacco Use History[2]  Labs: Review Flowsheet  More data exists      Latest Ref Rng & Units 09/19/2012 04/06/2017 06/05/2021 06/06/2021 10/21/2023  Labs for [facility]P Cardiac and Pulmonary Rehab  Cholestrol 0 - 200 mg/dL 863  - - 98  -  LDL (calc) 0 - 99 mg/dL 73  - - 57  -  HDL-C >59 mg/dL 61.09  - - 28  -  Trlycerides <150 mg/dL 878.9  - - 63  -  Hemoglobin A1c 4.8 - 5.6 % - - - 5.8  -  PH, Arterial 7.35 - 7.45 - - - - 7.397   PCO2 arterial 32 - 48 mmHg - - - - 35.8   Bicarbonate 20.0 - 28.0 mmol/L - - - - 22.0  23.3   TCO2 22 - 32 mmol/L - 24  26  - 23  25   Acid-base deficit 0.0 - 2.0 mmol/L - - - - 2.0  2.0   O2 Saturation % - - - - 94  63     Details       Multiple values from one day are sorted in reverse-chronological order          Pulmonary Assessment  Scores:   UCSD: Self-administered rating of dyspnea associated with activities of daily living (ADLs) 6-point scale (0 = not at all to 5 = maximal or unable to do because of breathlessness)  Scoring Scores range from 0 to 120.  Minimally important difference is 5 units  CAT: CAT can identify the health impairment of COPD patients and is better correlated with disease progression.  CAT has a scoring range of zero to 40. The CAT score is classified into four groups of low (less than 10), medium (10 - 20), high (21-30) and very high (31-40) based on the impact level of disease on health status. A CAT score over 10 suggests significant symptoms.  A worsening CAT score could be explained by an exacerbation, poor medication adherence, poor inhaler technique, or progression of COPD or comorbid conditions.  CAT MCID is 2 points  mMRC: mMRC (Modified Medical Research Council) Dyspnea Scale is used to assess the degree of baseline functional disability in patients of respiratory disease due to dyspnea. No minimal important difference is established. A decrease in  score of 1 point or greater is considered a positive change.   Pulmonary Function Assessment:   Exercise Target Goals: Exercise Program Goal: Individual exercise prescription set using results from initial 6 min walk test and THRR while considering  patients activity barriers and safety.   Exercise Prescription Goal: Initial exercise prescription builds to 30-45 minutes a day of aerobic activity, 2-3 days per week.  Home exercise guidelines will be given to patient during program as part of exercise prescription that the participant will acknowledge.  Education: Aerobic Exercise: - Group verbal and visual presentation on the components of exercise prescription. Introduces F.I.T.T principle from ACSM for exercise prescriptions.  Reviews F.I.T.T. principles of aerobic exercise including progression. Written material provided at class  time.   Education: Resistance Exercise: - Group verbal and visual presentation on the components of exercise prescription. Introduces F.I.T.T principle from ACSM for exercise prescriptions  Reviews F.I.T.T. principles of resistance exercise including progression. Written material provided at class time.    Education: Exercise & Equipment Safety: - Individual verbal instruction and demonstration of equipment use and safety with use of the equipment.   Education: Exercise Physiology & General Exercise Guidelines: - Group verbal and written instruction with models to review the exercise physiology of the cardiovascular system and associated critical values. Provides general exercise guidelines with specific guidelines to those with heart or lung disease.    Education: Flexibility, Balance, Mind/Body Relaxation: - Group verbal and visual presentation with interactive activity on the components of exercise prescription. Introduces F.I.T.T principle from ACSM for exercise prescriptions. Reviews F.I.T.T. principles of flexibility and balance exercise training including progression. Also discusses the mind body connection.  Reviews various relaxation techniques to help reduce and manage stress (i.e. Deep breathing, progressive muscle relaxation, and visualization). Balance handout provided to take home. Written material provided at class time.   Activity Barriers & Risk Stratification:  Activity Barriers & Cardiac Risk Stratification - 12/04/23 0855       Activity Barriers & Cardiac Risk Stratification   Activity Barriers Shortness of Breath;Back Problems   Low back pain at times.         6 Minute Walk:  6 Minute Walk     Row Name 12/04/23 1000         6 Minute Walk   Phase Initial     Distance 1300 feet     Walk Time 6 minutes     # of Rest Breaks 0     MPH 2.46     METS 2.71     RPE 12     Perceived Dyspnea  2     VO2 Peak 9.47     Symptoms Yes (comment)     Comments SOB      Resting HR 60 bpm     Resting BP 150/60     Resting Oxygen Saturation  96 %     Exercise Oxygen Saturation  during 6 min walk 90 %     Max Ex. HR 92 bpm     Max Ex. BP 170/60     2 Minute Post BP 140/60       Interval HR   1 Minute HR 66     2 Minute HR 81     3 Minute HR 85     4 Minute HR 87     5 Minute HR 90     6 Minute HR 92     2 Minute Post HR 62  Interval Heart Rate? Yes       Interval Oxygen   Interval Oxygen? Yes     Baseline Oxygen Saturation % 96 %     1 Minute Oxygen Saturation % 92 %     1 Minute Liters of Oxygen 0 L     2 Minute Oxygen Saturation % 90 %     2 Minute Liters of Oxygen 0 L     3 Minute Oxygen Saturation % 90 %     3 Minute Liters of Oxygen 0 L     4 Minute Oxygen Saturation % 91 %     4 Minute Liters of Oxygen 0 L     5 Minute Oxygen Saturation % 92 %     5 Minute Liters of Oxygen 0 L     6 Minute Oxygen Saturation % 92 %     6 Minute Liters of Oxygen 0 L     2 Minute Post Oxygen Saturation % 97 %     2 Minute Post Liters of Oxygen 0 L       Oxygen Initial Assessment:  Oxygen Initial Assessment - 12/04/23 0932       Home Oxygen   Home Oxygen Device None    Sleep Oxygen Prescription None    Home Exercise Oxygen Prescription None    Home Resting Oxygen Prescription None      Initial 6 min Walk   Oxygen Used None      Program Oxygen Prescription   Program Oxygen Prescription None      Intervention   Short Term Goals To learn and understand importance of monitoring SPO2 with pulse oximeter and demonstrate accurate use of the pulse oximeter.;To learn and understand importance of maintaining oxygen saturations>88%;To learn and demonstrate proper pursed lip breathing techniques or other breathing techniques. ;To learn and demonstrate proper use of respiratory medications    Long  Term Goals Verbalizes importance of monitoring SPO2 with pulse oximeter and return demonstration;Maintenance of O2 saturations>88%;Exhibits proper breathing  techniques, such as pursed lip breathing or other method taught during program session;Compliance with respiratory medication;Demonstrates proper use of MDIs          Oxygen Re-Evaluation:   Oxygen Discharge (Final Oxygen Re-Evaluation):   Initial Exercise Prescription:  Initial Exercise Prescription - 12/04/23 1000       Date of Initial Exercise RX and Referring Provider   Date 12/04/23    Referring Provider Catherine Cools MD      Treadmill   MPH 1.6    Grade 0    Minutes 15    METs 2.23      NuStep   Level 2    SPM 50    Minutes 15    METs 1.9      Prescription Details   Frequency (times per week) 2    Duration Progress to 30 minutes of continuous aerobic without signs/symptoms of physical distress      Intensity   THRR 40-80% of Max Heartrate 90-119    Ratings of Perceived Exertion 11-13    Perceived Dyspnea 0-4      Resistance Training   Training Prescription Yes    Weight 4    Reps 10-15          Perform Capillary Blood Glucose checks as needed.  Exercise Prescription Changes:   Exercise Prescription Changes     Row Name 12/04/23 1000             Response to  Exercise   Blood Pressure (Admit) 150/60       Blood Pressure (Exercise) 170/60       Blood Pressure (Exit) 140/60       Heart Rate (Admit) 60 bpm       Heart Rate (Exercise) 92 bpm       Heart Rate (Exit) 62 bpm       Oxygen Saturation (Admit) 96 %       Oxygen Saturation (Exercise) 90 %       Oxygen Saturation (Exit) 97 %       Rating of Perceived Exertion (Exercise) 12       Perceived Dyspnea (Exercise) 2          Exercise Comments:   Exercise Comments     Row Name 12/10/23 1039           Exercise Comments First full day of exercise!  Patient was oriented to gym and equipment including functions, settings, policies, and procedures.  Patient's individual exercise prescription and treatment plan were reviewed.  All starting workloads were established based on the  results of the 6 minute walk test done at initial orientation visit.  The plan for exercise progression was also introduced and progression will be customized based on patient's performance and goals.          Exercise Goals and Review:   Exercise Goals     Row Name 12/04/23 1003             Exercise Goals   Increase Physical Activity Yes       Intervention Provide advice, education, support and counseling about physical activity/exercise needs.;Develop an individualized exercise prescription for aerobic and resistive training based on initial evaluation findings, risk stratification, comorbidities and participant's personal goals.       Expected Outcomes Short Term: Attend rehab on a regular basis to increase amount of physical activity.;Long Term: Add in home exercise to make exercise part of routine and to increase amount of physical activity.;Long Term: Exercising regularly at least 3-5 days a week.       Increase Strength and Stamina Yes       Intervention Provide advice, education, support and counseling about physical activity/exercise needs.;Develop an individualized exercise prescription for aerobic and resistive training based on initial evaluation findings, risk stratification, comorbidities and participant's personal goals.       Expected Outcomes Short Term: Increase workloads from initial exercise prescription for resistance, speed, and METs.;Short Term: Perform resistance training exercises routinely during rehab and add in resistance training at home;Long Term: Improve cardiorespiratory fitness, muscular endurance and strength as measured by increased METs and functional capacity ( )       Able to understand and use rate of perceived exertion (RPE) scale Yes       Intervention Provide education and explanation on how to use RPE scale       Expected Outcomes Short Term: Able to use RPE daily in rehab to express subjective intensity level;Long Term:  Able to use RPE to guide  intensity level when exercising independently       Able to understand and use Dyspnea scale Yes       Intervention Provide education and explanation on how to use Dyspnea scale       Expected Outcomes Short Term: Able to use Dyspnea scale daily in rehab to express subjective sense of shortness of breath during exertion;Long Term: Able to use Dyspnea scale to guide intensity level when exercising independently  Knowledge and understanding of Target Heart Rate Range (THRR) Yes       Intervention Provide education and explanation of THRR including how the numbers were predicted and where they are located for reference       Expected Outcomes Short Term: Able to state/look up THRR;Long Term: Able to use THRR to govern intensity when exercising independently;Short Term: Able to use daily as guideline for intensity in rehab       Able to check pulse independently Yes       Intervention Provide education and demonstration on how to check pulse in carotid and radial arteries.;Review the importance of being able to check your own pulse for safety during independent exercise       Expected Outcomes Short Term: Able to explain why pulse checking is important during independent exercise;Long Term: Able to check pulse independently and accurately       Understanding of Exercise Prescription Yes       Intervention Provide education, explanation, and written materials on patient's individual exercise prescription       Expected Outcomes Short Term: Able to explain program exercise prescription;Long Term: Able to explain home exercise prescription to exercise independently          Exercise Goals Re-Evaluation :   Discharge Exercise Prescription (Final Exercise Prescription Changes):  Exercise Prescription Changes - 12/04/23 1000       Response to Exercise   Blood Pressure (Admit) 150/60    Blood Pressure (Exercise) 170/60    Blood Pressure (Exit) 140/60    Heart Rate (Admit) 60 bpm    Heart Rate  (Exercise) 92 bpm    Heart Rate (Exit) 62 bpm    Oxygen Saturation (Admit) 96 %    Oxygen Saturation (Exercise) 90 %    Oxygen Saturation (Exit) 97 %    Rating of Perceived Exertion (Exercise) 12    Perceived Dyspnea (Exercise) 2          Nutrition:  Target Goals: Understanding of nutrition guidelines, daily intake of sodium 1500mg , cholesterol 200mg , calories 30% from fat and 7% or less from saturated fats, daily to have 5 or more servings of fruits and vegetables.  Education: Nutrition 1 -Group instruction provided by verbal, written material, interactive activities, discussions, models, and posters to present general guidelines for heart healthy nutrition including macronutrients, label reading, and promoting whole foods over processed counterparts. Education serves as pensions consultant of discussion of heart healthy eating for all. Written material provided at class time.     Education: Nutrition 2 -Group instruction provided by verbal, written material, interactive activities, discussions, models, and posters to present general guidelines for heart healthy nutrition including sodium, cholesterol, and saturated fat. Providing guidance of habit forming to improve blood pressure, cholesterol, and body weight. Written material provided at class time.     Biometrics:  Pre Biometrics - 12/04/23 1004       Pre Biometrics   Height 5' 10 (1.778 m)    Weight 143 lb 1.3 oz (64.9 kg)    Waist Circumference 33 inches    Hip Circumference 37 inches    Waist to Hip Ratio 0.89 %    BMI (Calculated) 20.53    Grip Strength 32.3 kg           Nutrition Therapy Plan and Nutrition Goals:   Nutrition Assessments:  MEDIFICTS Score Key: >=70 Need to make dietary changes  40-70 Heart Healthy Diet <= 40 Therapeutic Level Cholesterol Diet   Picture Your Plate Scores: <  40 Unhealthy dietary pattern with much room for improvement. 41-50 Dietary pattern unlikely to meet recommendations for  good health and room for improvement. 51-60 More healthful dietary pattern, with some room for improvement.  >60 Healthy dietary pattern, although there may be some specific behaviors that could be improved.   Nutrition Goals Re-Evaluation:   Nutrition Goals Discharge (Final Nutrition Goals Re-Evaluation):   Psychosocial: Target Goals: Acknowledge presence or absence of significant depression and/or stress, maximize coping skills, provide positive support system. Participant is able to verbalize types and ability to use techniques and skills needed for reducing stress and depression.   Education: Stress, Anxiety, and Depression - Group verbal and visual presentation to define topics covered.  Reviews how body is impacted by stress, anxiety, and depression.  Also discusses healthy ways to reduce stress and to treat/manage anxiety and depression.  Written material provided at class time. Flowsheet Row PULMONARY REHAB OTHER RESPIRATORY from 12/12/2023 in [facility]  Date 12/12/23  Educator [facility]  Instruction Review Code 1- Verbalizes Understanding    Education: Sleep Hygiene -Provides group verbal and written instruction about how sleep can affect your health.  Define sleep hygiene, discuss sleep cycles and impact of sleep habits. Review good sleep hygiene tips.    Initial Review & Psychosocial Screening:  Initial Psych Review & Screening - 12/04/23 0935       Initial Review   Current issues with None Identified      Family Dynamics   Good Support System? Yes    Comments Patient's daughter and son support him.      Barriers   Psychosocial barriers to participate in program The patient should benefit from training in stress management and relaxation.;There are no identifiable barriers or psychosocial needs.      Screening Interventions   Interventions To provide support and resources with identified psychosocial needs;Encouraged to exercise;Provide feedback  about the scores to participant    Expected Outcomes Short Term goal: Utilizing psychosocial counselor, staff and physician to assist with identification of specific Stressors or current issues interfering with healing process. Setting desired goal for each stressor or current issue identified.;Long Term Goal: Stressors or current issues are controlled or eliminated.;Short Term goal: Identification and review with participant of any Quality of Life or Depression concerns found by scoring the questionnaire.;Long Term goal: The participant improves quality of Life and PHQ9 Scores as seen by post scores and/or verbalization of changes          Quality of Life Scores:  Scores of 19 and below usually indicate a poorer quality of life in these areas.  A difference of  2-3 points is a clinically meaningful difference.  A difference of 2-3 points in the total score of the Quality of Life Index has been associated with significant improvement in overall quality of life, self-image, physical symptoms, and general health in studies assessing change in quality of life.  PHQ-9: Review Flowsheet       12/04/2023 09/19/2012  Depression screen PHQ 2/9  Decreased Interest 0 0  Down, Depressed, Hopeless 0 0  PHQ - 2 Score 0 0  Altered sleeping 0 -  Tired, decreased energy 0 -  Change in appetite 0 -  Feeling bad or failure about yourself  0 -  Trouble concentrating 0 -  Moving slowly or fidgety/restless 0 -  Suicidal thoughts 0 -  PHQ-9 Score 0 -   Interpretation of Total Score  Total Score Depression Severity:  1-4 = Minimal depression,  5-9 = Mild depression, 10-14 = Moderate depression, 15-19 = Moderately severe depression, 20-27 = Severe depression   Psychosocial Evaluation and Intervention:  Psychosocial Evaluation - 12/04/23 0935       Psychosocial Evaluation & Interventions   Interventions Stress management education;Relaxation education;Encouraged to exercise with the program and follow  exercise prescription    Comments Patient was referred to pulmonary rehab with centrilobular emphysema. He denies any depression or anixety. His daughter recently moved in with him. She moved from [place]. He says he sleeps well. 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 He is being elvaluated by cardiology for the etiology of his worsning SOB with possible PCI in the future. This will be discussed with Dr. Alvan in Feb 26. He is very active doing home repairs. He has several rental properties along with his son and he does home remodeling and repairs. He also walks when he can. His goals for the program are to be able to walk further and do more activities with less SOB. He has no barriers to complete the program.    Expected Outcomes Short Term: Patient will start the program and attend consistently. Long Term: Patient will complete the program meeting personal goals.    Continue Psychosocial Services  Follow up required by staff          Psychosocial Re-Evaluation:   Psychosocial Discharge (Final Psychosocial Re-Evaluation):   Education: Education Goals: Education classes will be provided on a weekly basis, covering required topics. Participant will state understanding/return demonstration of topics presented.  Learning Barriers/Preferences:  Learning Barriers/Preferences - 12/04/23 0933       Learning Barriers/Preferences   Learning Barriers Hearing    Learning Preferences Skilled Demonstration;Written Material;Audio          General Pulmonary Education Topics:  Infection Prevention: - Provides verbal and written material to individual with discussion of infection control including proper hand washing and proper equipment cleaning during exercise session.   Falls Prevention: - Provides verbal and written material to individual with discussion of falls prevention and safety.   Chronic Lung Disease Review: - Group verbal instruction with posters,  models, PowerPoint presentations and videos,  to review new updates, new respiratory medications, new advancements in procedures and treatments. Providing information on websites and 800 numbers for continued self-education. Includes information about supplement oxygen, available portable oxygen systems, continuous and intermittent flow rates, oxygen safety, concentrators, and Medicare reimbursement for oxygen. Explanation of Pulmonary Drugs, including class, frequency, complications, importance of spacers, rinsing mouth after steroid MDI's, and proper cleaning methods for nebulizers. Review of basic lung anatomy and physiology related to function, structure, and complications of lung disease. Review of risk factors. Discussion about methods for diagnosing sleep apnea and types of masks and machines for OSA. Includes a review of the use of types of environmental controls: home humidity, furnaces, filters, dust mite/pet prevention, HEPA vacuums. Discussion about weather changes, air quality and the benefits of nasal washing. Instruction on Warning signs, infection symptoms, calling MD promptly, preventive modes, and value of vaccinations. Review of effective airway clearance, coughing and/or vibration techniques. Emphasizing that all should Create an Action Plan. Written material provided at class time.   AED/CPR: - Group verbal and written instruction with the use of models to demonstrate the basic use of the AED with the basic ABC's of resuscitation.    Tests and Procedures:  - Group verbal and visual presentation and models provide information about basic cardiac anatomy and function.  Reviews the testing methods done to diagnose heart disease and the outcomes of the test results. Describes the treatment choices: Medical Management, Angioplasty, or Coronary Bypass Surgery for treating various heart conditions including Myocardial Infarction, Angina, Valve Disease, and Cardiac Arrhythmias.  Written  material provided at class time.   Medication Safety: - Group verbal and visual instruction to review commonly prescribed medications for heart and lung disease. Reviews the medication, class of the drug, and side effects. Includes the steps to properly store meds and maintain the prescription regimen.  Written material given at graduation.   Other: -Provides group and verbal instruction on various topics (see comments)   Knowledge Questionnaire Score:    Core Components/Risk Factors/Patient Goals at Admission:  Personal Goals and Risk Factors at Admission - 12/04/23 0934       Core Components/Risk Factors/Patient Goals on Admission    Weight Management Weight Maintenance    Improve shortness of breath with ADL's Yes    Intervention Provide education, individualized exercise plan and daily activity instruction to help decrease symptoms of SOB with activities of daily living.    Expected Outcomes Short Term: Improve cardiorespiratory fitness to achieve a reduction of symptoms when performing ADLs;Long Term: Be able to perform more ADLs without symptoms or delay the onset of symptoms    Increase knowledge of respiratory medications and ability to use respiratory devices properly  Yes    Intervention Provide education and demonstration as needed of appropriate use of medications, inhalers, and oxygen therapy.    Expected Outcomes Short Term: Achieves understanding of medications use. Understands that oxygen is a medication prescribed by physician. Demonstrates appropriate use of inhaler and oxygen therapy.;Long Term: Maintain appropriate use of medications, inhalers, and oxygen therapy.    Hypertension Yes    Intervention Provide education on lifestyle modifcations including regular physical activity/exercise, weight management, moderate sodium restriction and increased consumption of fresh fruit, vegetables, and low fat dairy, alcohol moderation, and smoking cessation.;Monitor prescription  use compliance.    Expected Outcomes Short Term: Continued assessment and intervention until BP is < 140/55mm HG in hypertensive participants. < 130/65mm HG in hypertensive participants with diabetes, heart failure or chronic kidney disease.;Long Term: Maintenance of blood pressure at goal levels.    Lipids Yes    Intervention Provide education and support for participant on nutrition & aerobic/resistive exercise along with prescribed medications to achieve LDL 70mg , HDL >40mg .    Expected Outcomes Short Term: Participant states understanding of desired cholesterol values and is compliant with medications prescribed. Participant is following exercise prescription and nutrition guidelines.;Long Term: Cholesterol controlled with medications as prescribed, with individualized exercise RX and with personalized nutrition plan. Value goals: LDL < 70mg , HDL > 40 mg.          Education:Diabetes - Individual verbal and written instruction to review signs/symptoms of diabetes, desired ranges of glucose level fasting, after meals and with exercise. Acknowledge that pre and post exercise glucose checks will be done for 3 sessions at entry of program.   Know Your Numbers and Heart Failure: - Group verbal and visual instruction to discuss disease risk factors for cardiac and pulmonary disease and treatment options.  Reviews associated critical values for Overweight/Obesity, Hypertension, Cholesterol, and Diabetes.  Discusses basics of heart failure: signs/symptoms and treatments.  Introduces Heart Failure Zone chart for action plan for heart failure. Written material provided at class time.   Core Components/Risk Factors/Patient Goals Review:    Core Components/Risk Factors/Patient Goals at Discharge (Final Review):  ITP Comments:  ITP Comments     Row Name 12/04/23 0955 12/10/23 1039 12/24/23 1054       ITP Comments Patient arrived for 1st visit/orientation/education at 0830. Patient was referred  to PR by Dr. Paula Alghanim due to Centrilobular Emphysema. During orientation advised patient on arrival and appointment times what to wear, what to do before, during and after exercise. Reviewed attendance and class policy.  Pt is scheduled to return Pulmonary Rehab on 12/10/23 at 10:30. Pt was advised to come to class 15 minutes before class starts.  Discussed RPE/Dpysnea scales. Patient participated in warm up stretches. Patient was able to complete 6 minute walk test.  Patient was measured for the equipment. Discussed equipment safety with patient. Took patient pre-anthropometric measurements. Patient finished visit at [phone]. First full day of exercise!  Patient was oriented to gym and equipment including functions, settings, policies, and procedures.  Patient's individual exercise prescription and treatment plan were reviewed.  All starting workloads were established based on the results of the 6 minute walk test done at initial orientation visit.  The plan for exercise progression was also introduced and progression will be customized based on patient's performance and goals. 30 day review completed. ITP sent to Dr.Jehanzeb Memon, Medical Director of  Pulmonary Rehab. Continue with ITP unless changes are made by physician.    Pt's wife left message at end of day yesterday that he has some other stuff going on and would not be returning.  We will call to follow up.        Comments: Discharge ITP     [1]  Current Outpatient Medications:    albuterol  (PROVENTIL ) (2.5 MG/3ML) 0.083% nebulizer solution, Take 2.5 mg by nebulization every 6 (six) hours as needed for wheezing or shortness of breath., Disp: , Rfl:    albuterol  (VENTOLIN  HFA) 108 (90 Base) MCG/ACT inhaler, Inhale 1-2 puffs into the lungs every 6 (six) hours as needed for wheezing or shortness of breath., Disp: , Rfl:    allopurinol  (ZYLOPRIM ) 100 MG tablet, Take 100 mg by mouth daily., Disp: , Rfl:    allopurinol  (ZYLOPRIM ) 100 MG tablet,  Take 1 tablet (100 mg total) by mouth daily. (Patient not taking: Reported on 11/26/2023), Disp: 90 tablet, Rfl: 3   apixaban  (ELIQUIS ) 2.5 MG TABS tablet, TAKE 1 TABLET TWICE DAILY (DISCONTINUE PLAVIX ), Disp: 180 tablet, Rfl: 1   arformoterol  (BROVANA ) 15 MCG/2ML NEBU, Take 2 mLs (15 mcg total) by nebulization 2 (two) times daily., Disp: 120 mL, Rfl: 6   cephALEXin  (KEFLEX ) 500 MG capsule, Take 1 capsule (500 mg total) by mouth 3 (three) times daily., Disp: 21 capsule, Rfl: 0   Cholecalciferol  (VITAMIN D ) 1000 UNITS capsule, Take 1,000 Units by mouth daily., Disp: , Rfl:    isosorbide  mononitrate (IMDUR ) 30 MG 24 hr tablet, Take 0.5 tablets (15 mg total) by mouth daily., Disp: 30 tablet, Rfl: 5   Metoprolol  Tartrate 37.5 MG TABS, Take 1 tablet (37.5 mg total) by mouth 2 (two) times daily., Disp: 60 tablet, Rfl: 6   nitroGLYCERIN  (NITROSTAT ) 0.4 MG SL tablet, Place 1 tablet (0.4 mg total) under the tongue every 5 (five) minutes x 3 doses as needed (if no relief after 3rd dose proceed to ED or call 911)., Disp: 25 tablet, Rfl: 2   ofloxacin  (FLOXIN ) 0.3 % OTIC solution, 10 Drop(s)s in affected ear every day for 7 days (Patient not taking: Reported on 12/04/2023), Disp: 5 mL, Rfl: 0   pantoprazole  (PROTONIX ) 40 MG  tablet, Take 1 tablet (40 mg total) by mouth daily for reflux., Disp: 90 tablet, Rfl: 3   revefenacin  (YUPELRI ) 175 MCG/3ML nebulizer solution, Take 3 mLs (175 mcg total) by nebulization daily., Disp: 90 mL, Rfl: 6   rosuvastatin  (CRESTOR ) 40 MG tablet, Take 1 tablet (40 mg total) by mouth at bedtime. Replacing Lipitor ., Disp: 90 tablet, Rfl: 3 [2]  Social History Tobacco Use  Smoking Status Former   Current packs/day: 0.00   Average packs/day: 1.5 packs/day for 20.0 years (30.0 ttl pk-yrs)   Types: Cigarettes   Start date: 01/22/1962   Quit date: 01/22/1982   Years since quitting: 41.9  Smokeless Tobacco Never  Tobacco Comments   Former smoker 10/18/21

## 2024-01-02 NOTE — Progress Notes (Signed)
 Discharge Progress Report  Patient Details  Name: Justin Lyons MRN: [id number] Date of Birth: 10-29-1935 Referring Provider:   Flowsheet Row PULMONARY REHAB OTHER RESP ORIENTATION from 12/04/2023 in [facility] CARDIAC RE[facility]ATION  Referring Provider Catherine Cools MD     Number of Visits: 3  Reason for Discharge:  Early Exit:  Stated they wanted to be closer to home  Smoking History:  Tobacco Use History[1]  Diagnosis:  Centrilobular emphysema (HCC)  ADL [facility]:   Initial Exercise Prescription:  Initial Exercise Prescription - 12/04/23 1000       Date of Initial Exercise RX and Referring Provider   Date 12/04/23    Referring Provider Catherine Cools MD      Treadmill   MPH 1.6    Grade 0    Minutes 15    METs 2.23      NuStep   Level 2    SPM 50    Minutes 15    METs 1.9      Prescription Details   Frequency (times per week) 2    Duration Progress to 30 minutes of continuous aerobic without signs/symptoms of physical distress      Intensity   THRR 40-80% of Max Heartrate 90-119    Ratings of Perceived Exertion 11-13    Perceived Dyspnea 0-4      Resistance Training   Training Prescription Yes    Weight 4    Reps 10-15          Discharge Exercise Prescription (Final Exercise Prescription Changes):  Exercise Prescription Changes - 12/04/23 1000       Response to Exercise   Blood Pressure (Admit) 150/60    Blood Pressure (Exercise) 170/60    Blood Pressure (Exit) 140/60    Heart Rate (Admit) 60 bpm    Heart Rate (Exercise) 92 bpm    Heart Rate (Exit) 62 bpm    Oxygen Saturation (Admit) 96 %    Oxygen Saturation (Exercise) 90 %    Oxygen Saturation (Exit) 97 %    Rating of Perceived Exertion (Exercise) 12    Perceived Dyspnea (Exercise) 2          Functional Capacity:  6 Minute Walk     Row Name 12/04/23 1000         6 Minute Walk   Phase Initial     Distance 1300 feet     Walk Time 6 minutes     # of Rest Breaks 0     MPH  2.46     METS 2.71     RPE 12     Perceived Dyspnea  2     VO2 Peak 9.47     Symptoms Yes (comment)     Comments SOB     Resting HR 60 bpm     Resting BP 150/60     Resting Oxygen Saturation  96 %     Exercise Oxygen Saturation  during 6 min walk 90 %     Max Ex. HR 92 bpm     Max Ex. BP 170/60     2 Minute Post BP 140/60       Interval HR   1 Minute HR 66     2 Minute HR 81     3 Minute HR 85     4 Minute HR 87     5 Minute HR 90     6 Minute HR 92     2 Minute Post  HR 62     Interval Heart Rate? Yes       Interval Oxygen   Interval Oxygen? Yes     Baseline Oxygen Saturation % 96 %     1 Minute Oxygen Saturation % 92 %     1 Minute Liters of Oxygen 0 L     2 Minute Oxygen Saturation % 90 %     2 Minute Liters of Oxygen 0 L     3 Minute Oxygen Saturation % 90 %     3 Minute Liters of Oxygen 0 L     4 Minute Oxygen Saturation % 91 %     4 Minute Liters of Oxygen 0 L     5 Minute Oxygen Saturation % 92 %     5 Minute Liters of Oxygen 0 L     6 Minute Oxygen Saturation % 92 %     6 Minute Liters of Oxygen 0 L     2 Minute Post Oxygen Saturation % 97 %     2 Minute Post Liters of Oxygen 0 L        Psychological, QOL, Others - Outcomes: PHQ 2/9:    12/04/2023    1:45 PM 09/19/2012    9:00 AM  Depression screen PHQ 2/9  Decreased Interest 0 0  Down, Depressed, Hopeless 0 0  PHQ - 2 Score 0 0  Altered sleeping 0   Tired, decreased energy 0   Change in appetite 0   Feeling bad or failure about yourself  0   Trouble concentrating 0   Moving slowly or fidgety/restless 0   Suicidal thoughts 0   PHQ-9 Score 0     Quality of Life:   Personal Goals: Goals established at orientation with interventions provided to work toward goal.  Personal Goals and Risk Factors at Admission - 12/04/23 0934       Core Components/Risk Factors/Patient Goals on Admission    Weight Management Weight Maintenance    Improve shortness of breath with ADL's Yes    Intervention  Provide education, individualized exercise plan and daily activity instruction to help decrease symptoms of SOB with activities of daily living.    Expected Outcomes Short Term: Improve cardiorespiratory fitness to achieve a reduction of symptoms when performing ADLs;Long Term: Be able to perform more ADLs without symptoms or delay the onset of symptoms    Increase knowledge of respiratory medications and ability to use respiratory devices properly  Yes    Intervention Provide education and demonstration as needed of appropriate use of medications, inhalers, and oxygen therapy.    Expected Outcomes Short Term: Achieves understanding of medications use. Understands that oxygen is a medication prescribed by physician. Demonstrates appropriate use of inhaler and oxygen therapy.;Long Term: Maintain appropriate use of medications, inhalers, and oxygen therapy.    Hypertension Yes    Intervention Provide education on lifestyle modifcations including regular physical activity/exercise, weight management, moderate sodium restriction and increased consumption of fresh fruit, vegetables, and low fat dairy, alcohol moderation, and smoking cessation.;Monitor prescription use compliance.    Expected Outcomes Short Term: Continued assessment and intervention until BP is < 140/40mm HG in hypertensive participants. < 130/62mm HG in hypertensive participants with diabetes, heart failure or chronic kidney disease.;Long Term: Maintenance of blood pressure at goal levels.    Lipids Yes    Intervention Provide education and support for participant on nutrition & aerobic/resistive exercise along with prescribed medications to achieve LDL 70mg , HDL >40mg .  Expected Outcomes Short Term: Participant states understanding of desired cholesterol values and is compliant with medications prescribed. Participant is following exercise prescription and nutrition guidelines.;Long Term: Cholesterol controlled with medications as  prescribed, with individualized exercise RX and with personalized nutrition plan. Value goals: LDL < 70mg , HDL > 40 mg.           Personal Goals Discharge:   Exercise Goals and Review:  Exercise Goals     Row Name 12/04/23 1003             Exercise Goals   Increase Physical Activity Yes       Intervention Provide advice, education, support and counseling about physical activity/exercise needs.;Develop an individualized exercise prescription for aerobic and resistive training based on initial evaluation findings, risk stratification, comorbidities and participant's personal goals.       Expected Outcomes Short Term: Attend rehab on a regular basis to increase amount of physical activity.;Long Term: Add in home exercise to make exercise part of routine and to increase amount of physical activity.;Long Term: Exercising regularly at least 3-5 days a week.       Increase Strength and Stamina Yes       Intervention Provide advice, education, support and counseling about physical activity/exercise needs.;Develop an individualized exercise prescription for aerobic and resistive training based on initial evaluation findings, risk stratification, comorbidities and participant's personal goals.       Expected Outcomes Short Term: Increase workloads from initial exercise prescription for resistance, speed, and METs.;Short Term: Perform resistance training exercises routinely during rehab and add in resistance training at home;Long Term: Improve cardiorespiratory fitness, muscular endurance and strength as measured by increased METs and functional capacity ( )       Able to understand and use rate of perceived exertion (RPE) scale Yes       Intervention Provide education and explanation on how to use RPE scale       Expected Outcomes Short Term: Able to use RPE daily in rehab to express subjective intensity level;Long Term:  Able to use RPE to guide intensity level when exercising independently        Able to understand and use Dyspnea scale Yes       Intervention Provide education and explanation on how to use Dyspnea scale       Expected Outcomes Short Term: Able to use Dyspnea scale daily in rehab to express subjective sense of shortness of breath during exertion;Long Term: Able to use Dyspnea scale to guide intensity level when exercising independently       Knowledge and understanding of Target Heart Rate Range (THRR) Yes       Intervention Provide education and explanation of THRR including how the numbers were predicted and where they are located for reference       Expected Outcomes Short Term: Able to state/look up THRR;Long Term: Able to use THRR to govern intensity when exercising independently;Short Term: Able to use daily as guideline for intensity in rehab       Able to check pulse independently Yes       Intervention Provide education and demonstration on how to check pulse in carotid and radial arteries.;Review the importance of being able to check your own pulse for safety during independent exercise       Expected Outcomes Short Term: Able to explain why pulse checking is important during independent exercise;Long Term: Able to check pulse independently and accurately       Understanding of Exercise Prescription Yes  Intervention Provide education, explanation, and written materials on patient's individual exercise prescription       Expected Outcomes Short Term: Able to explain program exercise prescription;Long Term: Able to explain home exercise prescription to exercise independently          Exercise Goals Re-Evaluation:   Nutrition & Weight - Outcomes:  Pre Biometrics - 12/04/23 1004       Pre Biometrics   Height 5' 10 (1.778 m)    Weight 143 lb 1.3 oz (64.9 kg)    Waist Circumference 33 inches    Hip Circumference 37 inches    Waist to Hip Ratio 0.89 %    BMI (Calculated) 20.53    Grip Strength 32.3 kg           Nutrition:   Nutrition  Discharge:   Education Questionnaire Score:   Goals reviewed with patient; copy given to patient.    [1]  Social History Tobacco Use  Smoking Status Former   Current packs/day: 0.00   Average packs/day: 1.5 packs/day for 20.0 years (30.0 ttl pk-yrs)   Types: Cigarettes   Start date: 01/22/1962   Quit date: 01/22/1982   Years since quitting: 41.9  Smokeless Tobacco Never  Tobacco Comments   Former smoker 10/18/21

## 2024-01-07 ENCOUNTER — Other Ambulatory Visit (HOSPITAL_BASED_OUTPATIENT_CLINIC_OR_DEPARTMENT_OTHER): Payer: Self-pay

## 2024-01-07 ENCOUNTER — Encounter (HOSPITAL_COMMUNITY)

## 2024-01-07 ENCOUNTER — Other Ambulatory Visit: Payer: Self-pay | Admitting: Cardiology

## 2024-01-07 DIAGNOSIS — I480 Paroxysmal atrial fibrillation: Secondary | ICD-10-CM

## 2024-01-07 MED ORDER — APIXABAN 2.5 MG PO TABS
2.5200 mg | ORAL_TABLET | Freq: Two times a day (BID) | ORAL | 1 refills | Status: AC
  Filled 2024-01-07: qty 180, 90d supply, fill #0
  Filled 2024-01-10 – 2024-01-14 (×3): qty 180, fill #0
  Filled 2024-01-15: qty 180, 90d supply, fill #0

## 2024-01-07 NOTE — Telephone Encounter (Signed)
 Prescription refill request for Eliquis  received. Indication:afib Last office visit:10/25 Scr: 2.47  8/25 Age:88 Weight:64.9  kg  Prescription refilled

## 2024-01-09 ENCOUNTER — Other Ambulatory Visit (HOSPITAL_BASED_OUTPATIENT_CLINIC_OR_DEPARTMENT_OTHER): Payer: Self-pay

## 2024-01-09 ENCOUNTER — Encounter

## 2024-01-09 ENCOUNTER — Encounter (HOSPITAL_COMMUNITY)

## 2024-01-10 ENCOUNTER — Other Ambulatory Visit (HOSPITAL_BASED_OUTPATIENT_CLINIC_OR_DEPARTMENT_OTHER): Payer: Self-pay

## 2024-01-10 NOTE — Telephone Encounter (Signed)
" ° ° [] °  [facility]              12/10/2023 10:32 AM EST by Zina Richerd DASEN, RN  Outgoing [place], [place] (Self) [phone] (Mobile) Remove  No Answer/Busy - Missed rehab today. No answer, left VM.       "

## 2024-01-12 ENCOUNTER — Ambulatory Visit

## 2024-01-12 ENCOUNTER — Ambulatory Visit: Payer: Self-pay | Admitting: Pulmonary Disease

## 2024-01-12 DIAGNOSIS — I2510 Atherosclerotic heart disease of native coronary artery without angina pectoris: Secondary | ICD-10-CM

## 2024-01-13 ENCOUNTER — Other Ambulatory Visit (HOSPITAL_BASED_OUTPATIENT_CLINIC_OR_DEPARTMENT_OTHER): Payer: Self-pay

## 2024-01-14 ENCOUNTER — Encounter (HOSPITAL_COMMUNITY)

## 2024-01-14 ENCOUNTER — Other Ambulatory Visit (HOSPITAL_BASED_OUTPATIENT_CLINIC_OR_DEPARTMENT_OTHER): Payer: Self-pay

## 2024-01-14 LAB — CUP PACEART REMOTE DEVICE CHECK
Date Time Interrogation Session: 20251220231229
Implantable Pulse Generator Implant Date: 20230517

## 2024-01-15 ENCOUNTER — Other Ambulatory Visit (HOSPITAL_BASED_OUTPATIENT_CLINIC_OR_DEPARTMENT_OTHER): Payer: Self-pay

## 2024-01-15 NOTE — Progress Notes (Signed)
 Remote Loop Recorder Transmission

## 2024-01-20 ENCOUNTER — Encounter

## 2024-01-21 ENCOUNTER — Encounter (HOSPITAL_COMMUNITY)

## 2024-01-24 ENCOUNTER — Other Ambulatory Visit (HOSPITAL_BASED_OUTPATIENT_CLINIC_OR_DEPARTMENT_OTHER): Payer: Self-pay

## 2024-01-25 ENCOUNTER — Ambulatory Visit: Payer: Self-pay | Admitting: Cardiology

## 2024-01-28 ENCOUNTER — Other Ambulatory Visit (HOSPITAL_BASED_OUTPATIENT_CLINIC_OR_DEPARTMENT_OTHER): Payer: Self-pay

## 2024-01-28 ENCOUNTER — Encounter (HOSPITAL_COMMUNITY)

## 2024-01-29 NOTE — Progress Notes (Signed)
 "  Established Patient Pulmonology Office Visit   Subjective:  Patient ID: Justin Lyons, male    DOB: 08/26/1935  MRN: [id number]  CC:  Chief Complaint  Patient presents with   COPD    Emphysema Shob / coughing sometimes brown/clear   Neb and inhaler that was prescribed is to expensive for pt and he is not using them    HPI  Justin Lyons is a 89 y.o. male with hx of HLD, HTN, chronic diastolic heart failure, CAD, and COPD (1.76, 70% pred) who presents for follow up.  Last seen on 11/26/2023, recommended LAMA/LABA via nebulizer with Brovana  and Yupelri . Ordered CT chest to follow pulmonary nodule.  Discussed the use of AI scribe software for clinical note transcription with the patient, who gave verbal consent to proceed.  History of Present Illness Justin Lyons is an 89 year old male with mild COPD who presents with shortness of breath. He was referred by Dr. Alvan to rule out COPD as the cause of his symptoms.  He experiences shortness of breath, particularly when walking uphill or carrying heavy objects, but not on level ground. He uses an Albuterol  inhaler very seldomly and has tried other inhalers and nebulizers, including Dulera, Yupelri , and Brovana , without noticing improvement. He used these treatments consistently for about a month. He is currently in physical therapy to build endurance and strength.  He has a history of emphysema and has not smoked since 1989. He reports occasional coughing but denies wheezing. His cough can vary, sometimes being dry and other times productive with difficulty expectorating phlegm. He uses a regular Albuterol  nebulizer at home to help loosen phlegm when needed.  He reports that he has a spot in his lung that has been stable for several years based on prior CT scans.  He has a history of anemia with a hemoglobin level of 10, which is below the normal range. He also has significant heart artery blockages and kidney problems, which are being  monitored by his heart doctor. He underwent a heart catheterization in September, and further interventions are pending improvement in his kidney function.  CAT > 10, MRRC > 2, no exacerbations ROS   Current Medications[1]      Objective:  BP (!) 100/54   Pulse (!) 54   Ht 5' 10 (1.778 m)   Wt 148 lb (67.1 kg)   SpO2 96% Comment: ra  BMI 21.24 kg/m  Wt Readings from Last 3 Encounters:  01/30/24 148 lb (67.1 kg)  12/04/23 143 lb 1.3 oz (64.9 kg)  11/26/23 145 lb 5.8 oz (65.9 kg)   BMI Readings from Last 3 Encounters:  01/30/24 21.24 kg/m  12/04/23 20.53 kg/m  11/26/23 20.86 kg/m   SpO2 Readings from Last 3 Encounters:  01/30/24 96%  11/26/23 93%  11/14/23 95%    Physical Exam General: NAD, alert, WD, WN Eyes: PERRL, no scleral icterus ENMT: oropharynx clear, good dentition, no oral lesions, mallampati score II Skin: warm, intact, no rashes Neck: JVD flat, ROM and lymph node assessment normal CV: RRR, no MRG, nl S1 and S2, no peripheral edema Resp: clear to auscultation bilaterally, no wheezes, rales, or rhonchi, normal effort, no clubbing/cyanosis Neuro: Awake alert oriented to person place time and situation  Diagnostic Review:  Last CBC Lab Results  Component Value Date   WBC 9.2 09/09/2023   HGB 10.2 (L) 10/21/2023   HCT 30.0 (L) 10/21/2023   MCV 99.7 09/09/2023   MCH 32.7 09/09/2023  RDW 14.2 09/09/2023   PLT 135 (L) 09/09/2023   Last metabolic panel Lab Results  Component Value Date   GLUCOSE 89 09/20/2023   NA 141 10/21/2023   K 3.8 10/21/2023   CL 103 09/20/2023   CO2 23 09/20/2023   BUN 27 (H) 09/20/2023   CREATININE 2.47 (H) 09/20/2023   GFRNONAA 25 (L) 09/20/2023   CALCIUM  9.2 09/20/2023   PROT 6.8 06/05/2021   ALBUMIN  3.7 06/05/2021   BILITOT 0.9 06/05/2021   ALKPHOS 99 06/05/2021   AST 39 06/05/2021   ALT 44 06/05/2021   ANIONGAP 14 09/20/2023    PFTs: mild obstruction + severe reduction in diffusion capacity.   CTA C/A/P  2019: IMPRESSION: 1. Infrarenal 5.4 cm abdominal aortic aneurysm, increased. Increasingly irregular outer aneurysm sac contour. New eccentric ill-defined fluid and haziness of the para-aortic fat. These findings are indicative of an unstable aneurysm with potential imminent rupture. 2. Aortic Atherosclerosis (ICD10-I70.0) and Emphysema (ICD10-J43.9). 3. Three-vessel coronary atherosclerosis. 4. 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 5. 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 6. Apical left upper lobe 6 mm solid pulmonary nodule, new since 2005 CT. Non-contrast chest CT at 6-12 months is recommended. If the nodule is stable at time of repeat CT, then future CT at 18-24 months (from today's scan) is considered optional for low-risk patients, but is recommended for high-risk patients. This recommendation follows the consensus statement: Guidelines for Management of Incidental Pulmonary Nodules Detected on CT Images:From the Fleischner Society 2017; published online before print (10.1148/radiol.7982838340). 7. Several additional chronic findings as above.   Echo 08/2023: 1. Left ventricular ejection fraction, by estimation, is 55 to 60%. Left  ventricular ejection fraction by 3D volume is 47 %. The left ventricle has  normal function. The left ventricle has no regional wall motion  abnormalities. There is mild left  ventricular hypertrophy. Left ventricular diastolic parameters are  consistent with Grade I diastolic dysfunction (impaired relaxation). The  average left ventricular global longitudinal strain is -14.9 %.   2. Right ventricular systolic function is normal. The right ventricular  size is normal. There is normal pulmonary artery systolic pressure.   3. Left atrial size was mildly dilated.   4. The mitral valve is normal in structure. Mild mitral valve  regurgitation. No evidence of mitral stenosis.   5. The aortic valve is  tricuspid. Aortic valve regurgitation is mild. No  aortic stenosis is present.   6. The inferior vena cava is normal in size with greater than 50%  respiratory variability, suggesting right atrial pressure of 3 mmHg.   CT Chest 01/01/2024: IMPRESSION: 1. Stable left upper lobe pulmonary nodules, largest measuring 6 mm. This exam constitutes greater than 5 years of imaging stability. Nodules can be considered definitively benign, and need no further imaging follow-up. 2. Coronary artery calcifications. 3. Aortic Atherosclerosis (ICD10-I70.0) and Emphysema (ICD10-J43.9).  LEFT HEART CATHETERIZATION: Conclusions: Significant two-vessel coronary artery disease, including chronic total occlusion of proximal RCA and 70% proximal LCx in-stent restenosis.  There is a 50% proximal LMCA stenosis that is not hemodynamically significant (RFR = 0.93, FFR 0.89). Normal left and right heart filling pressures. Mild to moderate pulmonary hypertension (PA 50/16, mean PA 27 mmHg). PVR 3 woods unit. PCWP 12 mmHg.    Assessment & Plan:   Assessment & Plan Centrilobular emphysema (HCC) Centrilobular emphysema Chronic centrilobular emphysema present on CT Chest with mild obstructive lung defect on PFTs and severe reduction in diffusion capacity. Inhalers and  nebulizers ineffective. Lungs unlikely to be primary cause of dyspnea. - Continue physical therapy for endurance and strength. - Use albuterol  nebulizer as needed for phlegm and dyspnea. Dyspnea on exertion Dyspnea on exertion Multifactorial dyspnea likely from emphysema, anemia, pulmonary hypertension and un-vascularized CAD. Cardiac issues suspected as major contributor. Pulmonary hypertension, low resistance (HCC) Prior RHC data suggestive of mild PH with mean PA 27 mmHg, PCWP 12, and PVR around 3. Suspect this maybe related to [facility] group III due to emphysema. Should rule out CTEPH with V/Q scan. We will submit order. The patient does not have any  symptoms of autoimmune disease. Prior echo in 08/2023 reviewed and RV size and function are normal. Will likely need annual TTE. Solitary pulmonary nodule Stable solitary pulmonary nodule Stable solitary pulmonary nodule for six years, benign with no changes on CT. - No further CT scans needed.  Orders Placed This Encounter  Procedures   NM Pulmonary Perfusion   I spent 30 minutes reviewing patient's chart including prior consultant notes, imaging, and PFTs as well as face-to-face with the patient, over half in discussion of the diagnosis and the importance of compliance with the treatment plan.  Return in about 1 year (around 01/29/2025).   Avril Busser, MD     [1]  Current Outpatient Medications:    albuterol  (PROVENTIL ) (2.5 MG/3ML) 0.083% nebulizer solution, Take 2.5 mg by nebulization every 6 (six) hours as needed for wheezing or shortness of breath., Disp: , Rfl:    albuterol  (VENTOLIN  HFA) 108 (90 Base) MCG/ACT inhaler, Inhale 1-2 puffs into the lungs every 6 (six) hours as needed for wheezing or shortness of breath., Disp: , Rfl:    allopurinol  (ZYLOPRIM ) 100 MG tablet, Take 100 mg by mouth daily., Disp: , Rfl:    apixaban  (ELIQUIS ) 2.5 MG TABS tablet, Take 1 tablet (2.52 mg total) by mouth 2 (two) times daily. (DISCONTINUE PLAVIX ), Disp: 180 tablet, Rfl: 1   Cholecalciferol  (VITAMIN D ) 1000 UNITS capsule, Take 1,000 Units by mouth daily., Disp: , Rfl:    isosorbide  mononitrate (IMDUR ) 30 MG 24 hr tablet, Take 0.5 tablets (15 mg total) by mouth daily., Disp: 30 tablet, Rfl: 5   Metoprolol  Tartrate 37.5 MG TABS, Take 1 tablet (37.5 mg total) by mouth 2 (two) times daily., Disp: 60 tablet, Rfl: 6   nitroGLYCERIN  (NITROSTAT ) 0.4 MG SL tablet, Place 1 tablet (0.4 mg total) under the tongue every 5 (five) minutes x 3 doses as needed (if no relief after 3rd dose proceed to ED or call 911)., Disp: 25 tablet, Rfl: 2   pantoprazole  (PROTONIX ) 40 MG tablet, Take 1 tablet (40 mg total) by  mouth daily for reflux., Disp: 90 tablet, Rfl: 3   rosuvastatin  (CRESTOR ) 40 MG tablet, Take 1 tablet (40 mg total) by mouth at bedtime. Replacing Lipitor ., Disp: 90 tablet, Rfl: 3   allopurinol  (ZYLOPRIM ) 100 MG tablet, Take 1 tablet (100 mg total) by mouth daily. (Patient not taking: Reported on 01/30/2024), Disp: 90 tablet, Rfl: 3   arformoterol  (BROVANA ) 15 MCG/2ML NEBU, Take 2 mLs (15 mcg total) by nebulization 2 (two) times daily. (Patient not taking: Reported on 01/30/2024), Disp: 120 mL, Rfl: 6   cephALEXin  (KEFLEX ) 500 MG capsule, Take 1 capsule (500 mg total) by mouth 3 (three) times daily., Disp: 21 capsule, Rfl: 0   ofloxacin  (FLOXIN ) 0.3 % OTIC solution, 10 Drop(s)s in affected ear every day for 7 days (Patient not taking: Reported on 12/04/2023), Disp: 5 mL, Rfl: 0   revefenacin  (  YUPELRI ) 175 MCG/3ML nebulizer solution, Take 3 mLs (175 mcg total) by nebulization daily. (Patient not taking: Reported on 01/30/2024), Disp: 90 mL, Rfl: 6  "

## 2024-01-30 ENCOUNTER — Encounter (HOSPITAL_COMMUNITY)

## 2024-01-30 ENCOUNTER — Encounter: Payer: Self-pay | Admitting: Pulmonary Disease

## 2024-01-30 ENCOUNTER — Ambulatory Visit: Admitting: Pulmonary Disease

## 2024-01-30 VITALS — BP 100/54 | HR 54 | Ht 70.0 in | Wt 148.0 lb

## 2024-01-30 DIAGNOSIS — J432 Centrilobular emphysema: Secondary | ICD-10-CM

## 2024-01-30 DIAGNOSIS — Z87891 Personal history of nicotine dependence: Secondary | ICD-10-CM

## 2024-01-30 DIAGNOSIS — I272 Pulmonary hypertension, unspecified: Secondary | ICD-10-CM

## 2024-01-30 DIAGNOSIS — R911 Solitary pulmonary nodule: Secondary | ICD-10-CM

## 2024-01-30 DIAGNOSIS — R0609 Other forms of dyspnea: Secondary | ICD-10-CM | POA: Diagnosis not present

## 2024-01-30 NOTE — Addendum Note (Signed)
 Addended by: Leatha Rohner on: 01/30/2024 12:56 PM   Modules accepted: Orders

## 2024-01-30 NOTE — Patient Instructions (Signed)
" °  VISIT SUMMARY: Today, we discussed your shortness of breath and reviewed your history of emphysema, COPD, and other health issues. We determined that your lungs are not the primary cause of your shortness of breath, and other factors like anemia and heart artery blockages may be contributing.  YOUR PLAN: CENTRILOBULAR EMPHYSEMA: You have chronic centrilobular emphysema with mild COPD. Inhalers and nebulizers have not been effective for you. -Continue with physical therapy to build endurance and strength. -Use your Albuterol  nebulizer as needed to help with phlegm and shortness of breath.  DYSPNEA ON EXERTION: Your shortness of breath when exerting yourself is likely due to a combination of emphysema, anemia, and heart artery blockages. Your heart issues are suspected to be a major contributor. -A note has been sent to your cardiologist, Dr. Dorn Ross, to discuss these findings and your management plan.  STABLE SOLITARY PULMONARY NODULE: You have a stable solitary pulmonary nodule that has not changed in six years and is considered benign. -No further CT scans are needed for this nodule.  Contains text generated by [employer].   "

## 2024-01-31 ENCOUNTER — Ambulatory Visit (HOSPITAL_COMMUNITY)
Admission: RE | Admit: 2024-01-31 | Discharge: 2024-01-31 | Disposition: A | Source: Ambulatory Visit | Attending: Pulmonary Disease | Admitting: Pulmonary Disease

## 2024-01-31 DIAGNOSIS — R0609 Other forms of dyspnea: Secondary | ICD-10-CM | POA: Insufficient documentation

## 2024-01-31 DIAGNOSIS — I272 Pulmonary hypertension, unspecified: Secondary | ICD-10-CM

## 2024-01-31 MED ORDER — TECHNETIUM TO 99M ALBUMIN AGGREGATED
4.0000 | Freq: Once | INTRAVENOUS | Status: AC | PRN
  Administered 2024-01-31: 4 via INTRAVENOUS

## 2024-02-03 ENCOUNTER — Telehealth (HOSPITAL_BASED_OUTPATIENT_CLINIC_OR_DEPARTMENT_OTHER): Payer: Self-pay

## 2024-02-03 NOTE — Telephone Encounter (Signed)
 CMN received for PT sent to [facility] signed by provider and faxed confirmation received

## 2024-02-04 ENCOUNTER — Encounter (HOSPITAL_COMMUNITY)

## 2024-02-05 ENCOUNTER — Ambulatory Visit: Payer: Self-pay | Admitting: Pulmonary Disease

## 2024-02-06 ENCOUNTER — Encounter (HOSPITAL_COMMUNITY)

## 2024-02-07 ENCOUNTER — Other Ambulatory Visit (HOSPITAL_BASED_OUTPATIENT_CLINIC_OR_DEPARTMENT_OTHER): Payer: Self-pay

## 2024-02-08 NOTE — Progress Notes (Signed)
 CXR done for the V/Q scan. No acute abnormalities.

## 2024-02-09 ENCOUNTER — Encounter

## 2024-02-10 ENCOUNTER — Encounter

## 2024-02-11 ENCOUNTER — Encounter (HOSPITAL_COMMUNITY)

## 2024-02-11 ENCOUNTER — Other Ambulatory Visit (HOSPITAL_BASED_OUTPATIENT_CLINIC_OR_DEPARTMENT_OTHER): Payer: Self-pay

## 2024-02-12 ENCOUNTER — Ambulatory Visit

## 2024-02-12 DIAGNOSIS — I2510 Atherosclerotic heart disease of native coronary artery without angina pectoris: Secondary | ICD-10-CM

## 2024-02-12 LAB — CUP PACEART REMOTE DEVICE CHECK
Date Time Interrogation Session: 20260120231819
Implantable Pulse Generator Implant Date: 20230517

## 2024-02-13 ENCOUNTER — Encounter (HOSPITAL_COMMUNITY)

## 2024-02-13 ENCOUNTER — Ambulatory Visit: Payer: Self-pay | Admitting: Cardiology

## 2024-02-15 NOTE — Progress Notes (Signed)
 Remote Loop Recorder Transmission

## 2024-02-18 ENCOUNTER — Encounter (HOSPITAL_COMMUNITY)

## 2024-02-20 ENCOUNTER — Encounter

## 2024-02-20 ENCOUNTER — Encounter (HOSPITAL_COMMUNITY)

## 2024-02-24 ENCOUNTER — Ambulatory Visit: Admitting: Cardiology

## 2024-02-25 ENCOUNTER — Encounter (HOSPITAL_COMMUNITY)

## 2024-02-27 ENCOUNTER — Encounter (HOSPITAL_COMMUNITY)

## 2024-03-03 ENCOUNTER — Encounter (HOSPITAL_COMMUNITY)

## 2024-03-05 ENCOUNTER — Encounter (HOSPITAL_COMMUNITY)

## 2024-03-10 ENCOUNTER — Encounter (HOSPITAL_COMMUNITY)

## 2024-03-11 ENCOUNTER — Encounter

## 2024-03-12 ENCOUNTER — Encounter (HOSPITAL_COMMUNITY)

## 2024-03-14 ENCOUNTER — Ambulatory Visit

## 2024-03-17 ENCOUNTER — Encounter (HOSPITAL_COMMUNITY)

## 2024-03-19 ENCOUNTER — Encounter (HOSPITAL_COMMUNITY)

## 2024-03-24 ENCOUNTER — Encounter (HOSPITAL_COMMUNITY)

## 2024-03-26 ENCOUNTER — Encounter (HOSPITAL_COMMUNITY)

## 2024-03-31 ENCOUNTER — Encounter (HOSPITAL_COMMUNITY)

## 2024-04-02 ENCOUNTER — Encounter (HOSPITAL_COMMUNITY)

## 2024-04-07 ENCOUNTER — Encounter (HOSPITAL_COMMUNITY)

## 2024-04-11 ENCOUNTER — Encounter

## 2024-04-14 ENCOUNTER — Ambulatory Visit

## 2024-05-15 ENCOUNTER — Ambulatory Visit

## 2024-05-18 ENCOUNTER — Ambulatory Visit: Admitting: Cardiology

## 2024-06-15 ENCOUNTER — Ambulatory Visit

## 2024-07-16 ENCOUNTER — Ambulatory Visit

## 2024-08-16 ENCOUNTER — Ambulatory Visit

## 2024-09-16 ENCOUNTER — Ambulatory Visit

## 2024-10-17 ENCOUNTER — Ambulatory Visit

## 2024-11-17 ENCOUNTER — Ambulatory Visit

## 2024-12-18 ENCOUNTER — Ambulatory Visit
